# Patient Record
Sex: Female | Born: 1970 | Race: Black or African American | Hispanic: No | Marital: Single | Smoking: Current every day smoker
Health system: Southern US, Community
[De-identification: ages and names within clinical notes are randomized; demographics above are authoritative.]

## PROBLEM LIST (undated history)

## (undated) DIAGNOSIS — F32A Depression, unspecified: Secondary | ICD-10-CM

## (undated) DIAGNOSIS — L930 Discoid lupus erythematosus: Secondary | ICD-10-CM

## (undated) DIAGNOSIS — N390 Urinary tract infection, site not specified: Secondary | ICD-10-CM

## (undated) DIAGNOSIS — D649 Anemia, unspecified: Secondary | ICD-10-CM

## (undated) DIAGNOSIS — K219 Gastro-esophageal reflux disease without esophagitis: Secondary | ICD-10-CM

## (undated) DIAGNOSIS — F17200 Nicotine dependence, unspecified, uncomplicated: Secondary | ICD-10-CM

## (undated) DIAGNOSIS — I82409 Acute embolism and thrombosis of unspecified deep veins of unspecified lower extremity: Secondary | ICD-10-CM

## (undated) DIAGNOSIS — M1990 Unspecified osteoarthritis, unspecified site: Secondary | ICD-10-CM

## (undated) DIAGNOSIS — A6000 Herpesviral infection of urogenital system, unspecified: Secondary | ICD-10-CM

## (undated) DIAGNOSIS — M329 Systemic lupus erythematosus, unspecified: Secondary | ICD-10-CM

## (undated) DIAGNOSIS — F909 Attention-deficit hyperactivity disorder, unspecified type: Secondary | ICD-10-CM

## (undated) DIAGNOSIS — Z720 Tobacco use: Secondary | ICD-10-CM

## (undated) DIAGNOSIS — E119 Type 2 diabetes mellitus without complications: Secondary | ICD-10-CM

## (undated) DIAGNOSIS — F320 Major depressive disorder, single episode, mild: Secondary | ICD-10-CM

## (undated) DIAGNOSIS — R200 Anesthesia of skin: Secondary | ICD-10-CM

## (undated) DIAGNOSIS — M359 Systemic involvement of connective tissue, unspecified: Secondary | ICD-10-CM

## (undated) DIAGNOSIS — I2699 Other pulmonary embolism without acute cor pulmonale: Secondary | ICD-10-CM

## (undated) DIAGNOSIS — IMO0002 Reserved for concepts with insufficient information to code with codable children: Secondary | ICD-10-CM

## (undated) DIAGNOSIS — F419 Anxiety disorder, unspecified: Secondary | ICD-10-CM

## (undated) DIAGNOSIS — F988 Other specified behavioral and emotional disorders with onset usually occurring in childhood and adolescence: Secondary | ICD-10-CM

## (undated) DIAGNOSIS — E785 Hyperlipidemia, unspecified: Secondary | ICD-10-CM

## (undated) HISTORY — DX: Anxiety disorder, unspecified: F41.9

## (undated) HISTORY — DX: Anesthesia of skin: R20.0

## (undated) HISTORY — DX: Major depressive disorder, single episode, mild: F32.0

## (undated) HISTORY — DX: Acute embolism and thrombosis of unspecified deep veins of unspecified lower extremity: I82.409

## (undated) HISTORY — PX: DG TOES*L*: HXRAD270

## (undated) HISTORY — DX: Other specified behavioral and emotional disorders with onset usually occurring in childhood and adolescence: F98.8

## (undated) HISTORY — PX: ABLATION: SHX5711

## (undated) HISTORY — DX: Systemic lupus erythematosus, unspecified: M32.9

## (undated) HISTORY — DX: Type 2 diabetes mellitus without complications: E11.9

## (undated) HISTORY — DX: Herpesviral infection of urogenital system, unspecified: A60.00

## (undated) HISTORY — DX: Depression, unspecified: F32.A

## (undated) HISTORY — DX: Hyperlipidemia, unspecified: E78.5

## (undated) HISTORY — DX: Reserved for concepts with insufficient information to code with codable children: IMO0002

## (undated) HISTORY — DX: Unspecified osteoarthritis, unspecified site: M19.90

## (undated) HISTORY — DX: Other pulmonary embolism without acute cor pulmonale: I26.99

## (undated) HISTORY — DX: Urinary tract infection, site not specified: N39.0

## (undated) HISTORY — PX: DILATION AND CURETTAGE OF UTERUS: SHX78

---

## 2001-11-22 HISTORY — PX: TUBAL LIGATION: SHX77

## 2005-09-06 ENCOUNTER — Emergency Department: Payer: Self-pay | Admitting: Unknown Physician Specialty

## 2007-02-22 ENCOUNTER — Emergency Department: Payer: Self-pay | Admitting: Emergency Medicine

## 2008-02-03 ENCOUNTER — Emergency Department: Payer: Self-pay | Admitting: Emergency Medicine

## 2009-01-17 ENCOUNTER — Emergency Department: Payer: Self-pay | Admitting: Emergency Medicine

## 2009-05-11 ENCOUNTER — Emergency Department: Payer: Self-pay | Admitting: Emergency Medicine

## 2010-07-24 ENCOUNTER — Emergency Department: Payer: Self-pay | Admitting: Emergency Medicine

## 2012-03-10 LAB — HM PAP SMEAR: HM Pap smear: NEGATIVE

## 2012-07-12 ENCOUNTER — Emergency Department: Payer: Self-pay | Admitting: *Deleted

## 2013-05-11 ENCOUNTER — Encounter: Payer: Self-pay | Admitting: Neurology

## 2013-05-11 ENCOUNTER — Ambulatory Visit (INDEPENDENT_AMBULATORY_CARE_PROVIDER_SITE_OTHER): Payer: BC Managed Care – PPO | Admitting: Neurology

## 2013-05-11 VITALS — BP 116/74 | HR 99 | Ht 63.0 in | Wt 220.0 lb

## 2013-05-11 DIAGNOSIS — L930 Discoid lupus erythematosus: Secondary | ICD-10-CM

## 2013-05-11 DIAGNOSIS — R200 Anesthesia of skin: Secondary | ICD-10-CM

## 2013-05-11 DIAGNOSIS — R209 Unspecified disturbances of skin sensation: Secondary | ICD-10-CM

## 2013-05-11 NOTE — Progress Notes (Signed)
[facility]  PATIENT: Melinda Ross DOB: 1971-11-08  HISTORICAL  Melinda Ross is a 42 years old right-handed African American female, referred by her rheumatologist Dr. Dareen Piano for evaluation of right side numbness  In 2011, 3 years ago, she began to notice right upper lip rash, over time it gradually getys bigger, itching, had biopsied by her dermatologist Dr. 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 antibodies, current diagnosis is discoid lupus, not on any immunosuppressive treatment  Over the past 2 years, she was also noticed intermittent numbness involving her right hand, and the right leg, it can happen independently, or sometimes together, sparing her right flank, numbness tingling from right hand extending to her right elbow, similar sensation from right knee down, lasting 20-30 minutes, takeup 10-25% of her day time, in between, she denies significant sensory loss, or weakness, but she had one episode, she stand up from sitting position, without realizing where her right foot are, she suffered right fifth metatarsal fracture because of right ankle eversion in August 2013,   She denied previous history of visual loss, no gait difficulty, no bowel bladder incontinence,  REVIEW OF SYSTEMS: Full 14 system review of systems performed and notable only for rash , easy bruising , skin sensitivity , memory loss , numbness   ALLERGIES: No Known Allergies  HOME MEDICATIONS: No outpatient prescriptions prior to visit.   No facility-administered  medications prior to visit.    PAST MEDICAL HISTORY: Past Medical History  Diagnosis Date  . Numbness     discoid lupus      PAST SURGICAL HISTORY: Past Surgical History  Procedure Laterality Date  . Cesarean section    . Dg toes*l* Bilateral     FAMILY HISTORY: Family History  Problem Relation Age of Onset  . High blood pressure Brother   . Diabetes Brother   . Kidney disease Brother     SOCIAL HISTORY:  History   Social History  . Marital Status: Unknown    Spouse Name: N/A    Number of Children: 3  . Years of Education: N/A   Occupational History  .      [employer]   Social History Main Topics  . Smoking status: Current Some Day Smoker    Types: Cigarettes  . Smokeless tobacco: Never Used     Comment: half pack daily  . Alcohol Use: 0.6 oz/week    1 Shots of liquor per week     Comment: twice a year  . Drug Use: No  . Sexually Active: Not on file   Other Topics Concern  . Not on file   Social History Narrative   Patient is single and lives at home with her daughter. Patient works at [employer]. Patient has three children.   Right handed.    Caffeine -5     PHYSICAL EXAM  Filed Vitals:   05/11/13 0913  BP: 116/74  Pulse: 99  Height: 5\' 3"  (1.6 m)  Weight: 220 lb (99.791 kg)    Not recorded    Body mass index is 38.98 kg/(m^2).   Generalized: In no acute distress  Neck: Supple, no carotid bruits   Cardiac: Regular rate rhythm  Pulmonary: Clear to auscultation bilaterally  Musculoskeletal: No deformity  Neurological examination  Mentation: Alert oriented to time, place, history taking, and causual conversation  Cranial nerve II-XII: Pupils were equal round reactive to light extraocular movements were full, visual field were full on confrontational test. facial sensation and strength were normal. hearing was intact to finger rubbing bilaterally. Uvula tongue midline.  head turning and shoulder shrug and were normal and  symmetric.Tongue protrusion into cheek strength was normal.  Motor: normal tone, bulk and strength. Right wrist Tinel sign  Sensory: Intact to fine touch, pinprick, preserved vibratory sensation, and proprioception at toes.  Coordination: Normal finger to nose, heel-to-shin bilaterally there was no truncal ataxia  Gait: Rising up from seated position without assistance, normal stance, without trunk ataxia, moderate stride, good arm swing, smooth turning, able to perform tiptoe, and heel walking without difficulty.   Romberg signs: Negative  Deep tendon reflexes: Brachioradialis 2/2, biceps 2/2, triceps 2/2, patellar 2/2, Achilles 2/2, plantar responses were flexor bilaterally.   ASSESSMENT AND PLAN  42 years old right-handed [place] American female, with history of possible discoid lupus, now presenting with couple years history of intermittent right sided numbness, involving right hand, arm, and also right leg   1 differentiation diagnosis including right carpal tunnel syndrome, which would not explain her right leg symptoms, need to rule out left hemisphere pathology 2. MRI of brain EMG nerve conduction study  Orders Placed This Encounter  Procedures  . MR Brain Wo Contrast  . NCV with EMG(electromyography)    Levert Feinstein, M.D. Ph.D.  [facility] [address] [place], [place] [address] [phone]

## 2013-07-02 ENCOUNTER — Emergency Department: Payer: Self-pay | Admitting: Emergency Medicine

## 2013-07-02 LAB — URINALYSIS, COMPLETE
Ketone: NEGATIVE
Nitrite: NEGATIVE
Protein: 100
Squamous Epithelial: 55

## 2013-10-15 ENCOUNTER — Encounter: Payer: BC Managed Care – PPO | Admitting: Neurology

## 2013-11-22 DIAGNOSIS — A6000 Herpesviral infection of urogenital system, unspecified: Secondary | ICD-10-CM

## 2013-11-22 HISTORY — DX: Herpesviral infection of urogenital system, unspecified: A60.00

## 2014-05-09 ENCOUNTER — Ambulatory Visit: Payer: Self-pay | Admitting: Obstetrics and Gynecology

## 2016-01-13 ENCOUNTER — Emergency Department
Admission: EM | Admit: 2016-01-13 | Discharge: 2016-01-13 | Disposition: A | Discharge status: Home / Self Care | Payer: BLUE CROSS/BLUE SHIELD | Attending: Emergency Medicine | Admitting: Emergency Medicine

## 2016-01-13 ENCOUNTER — Encounter: Payer: Self-pay | Admitting: Emergency Medicine

## 2016-01-13 ENCOUNTER — Emergency Department: Payer: BLUE CROSS/BLUE SHIELD

## 2016-01-13 DIAGNOSIS — Y998 Other external cause status: Secondary | ICD-10-CM | POA: Diagnosis not present

## 2016-01-13 DIAGNOSIS — F17210 Nicotine dependence, cigarettes, uncomplicated: Secondary | ICD-10-CM | POA: Insufficient documentation

## 2016-01-13 DIAGNOSIS — S96912A Strain of unspecified muscle and tendon at ankle and foot level, left foot, initial encounter: Secondary | ICD-10-CM | POA: Insufficient documentation

## 2016-01-13 DIAGNOSIS — Y9389 Activity, other specified: Secondary | ICD-10-CM | POA: Diagnosis not present

## 2016-01-13 DIAGNOSIS — X58XXXA Exposure to other specified factors, initial encounter: Secondary | ICD-10-CM | POA: Diagnosis not present

## 2016-01-13 DIAGNOSIS — S99922A Unspecified injury of left foot, initial encounter: Secondary | ICD-10-CM | POA: Diagnosis present

## 2016-01-13 DIAGNOSIS — Y9289 Other specified places as the place of occurrence of the external cause: Secondary | ICD-10-CM | POA: Insufficient documentation

## 2016-01-13 MED ORDER — NAPROXEN 500 MG PO TABS: 500.0000 mg | ORAL_TABLET | Freq: Two times a day (BID) | ORAL | Status: DC

## 2016-01-13 MED ORDER — TRAMADOL HCL 50 MG PO TABS: 50.0000 mg | ORAL_TABLET | Freq: Four times a day (QID) | ORAL | Status: DC | PRN

## 2016-01-13 NOTE — ED Notes (Signed)
Pt presents to ED with left foot pain with no known injury. Onset of pain about 2 weeks ago. Has been to the onsite nurse at her place of employment as well as urgent care. Was told it was possibly a stress fx but then was told there was nothing broken. Worse with walking or when she is wearing shoes. Pt states when she takes her shoe off and is resting it doesn't hurt.

## 2016-01-13 NOTE — Discharge Instructions (Signed)
Taking medication as directed.

## 2016-01-13 NOTE — ED Notes (Signed)
States she developed pain to left foot 2 weeks ago. Denies any injury states pain is across top of foot  No swelling noted   Positive pulses and circulation ambulates with limp d/t pain

## 2016-01-13 NOTE — ED Provider Notes (Signed)
[facility] Emergency Department Provider Note  ____________________________________________  Time seen: Approximately 7:54 AM  I have reviewed the triage vital signs and the nursing notes.   HISTORY  Chief Complaint Foot Pain    HPI Melinda Ross is a 45 y.o. female presents with left foot pain for the past 2-3 weeks. The patient describes pain across the dorsal aspect of the foot with radiation around the 5th metatarsal and under the lateral malleolus. Pain with exacerbated by walking and wearing shoes. She denies recent trauma. He is a history of prolonged standing to the 12 hours a day 5-6 days a week.  Past Medical History  Diagnosis Date  . Numbness   . Lupus [facility])     Patient Active Problem List   Diagnosis Date Noted  . 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 Numbness     Past Surgical History  Procedure Laterality Date  . Cesarean section    . Dg toes*l* Bilateral     Current Outpatient Rx  Name  Route  Sig  Dispense  Refill  . ibuprofen (ADVIL,MOTRIN) 200 MG tablet   Oral   Take 200 mg by mouth every 6 (six) hours as needed for pain.         . naproxen (NAPROSYN) 500 MG tablet   Oral   Take 1 tablet (500 mg total) by mouth 2 (two) times daily with a meal.   20 tablet   0   . traMADol (ULTRAM) 50 MG tablet   Oral   Take 1 tablet (50 mg total) by mouth every 6 (six) hours as needed for moderate pain.   12 tablet   0     Allergies Review of patient's allergies indicates no known allergies.  Family History  Problem Relation Age of Onset  . High blood pressure Brother   . Diabetes Brother   . Kidney disease Brother     Social History Social History  Substance Use Topics  . Smoking status: Current Some Day Smoker    Types: Cigarettes  . Smokeless tobacco: Never Used     Comment: half pack daily  . Alcohol Use: No     Comment: twice a year    Review of Systems Constitutional: No fever/chills Eyes: No visual  changes. ENT: No sore throat. Cardiovascular: Denies chest pain. Respiratory: Denies shortness of breath. Gastrointestinal: No abdominal pain.  No nausea, no vomiting.  No diarrhea.  No constipation. Genitourinary: Negative for dysuria. Musculoskeletal: Left dorsal foot pain Skin: Negative for rash. Neurological: Negative for headaches, focal weakness or numbness. Hematological/Lymphatic: Allergic/Immunilogical: Lupus  10-point ROS otherwise negative.  ____________________________________________   PHYSICAL EXAM:  VITAL SIGNS: ED Triage Vitals  Enc Vitals Group     BP 01/13/16 0328 139/99 mmHg     Pulse Rate 01/13/16 0328 91     Resp 01/13/16 0328 18     Temp 01/13/16 0328 98 F (36.7 C)     Temp Source 01/13/16 0328 Oral     SpO2 01/13/16 0328 100 %     Weight 01/13/16 0328 234 lb (106.142 kg)     Height 01/13/16 0328 5\' 3"  (1.6 m)     Head Cir --      Peak Flow --      Pain Score 01/13/16 0328 8     Pain Loc --      Pain Edu? --      Excl. in [place]? --     Constitutional: Alert and oriented. Well  appearing and in no acute distress. Eyes: Conjunctivae are normal. PERRL. EOMI. Head: Atraumatic. Nose: No congestion/rhinnorhea. Mouth/Throat: Mucous membranes are moist.  Oropharynx non-erythematous. Neck: No stridor.  No cervical spine tenderness to palpation. Hematological/Lymphatic/Immunilogical: No cervical lymphadenopathy. Cardiovascular: Normal rate, regular rhythm. Grossly normal heart sounds.  Good peripheral circulation. Respiratory: Normal respiratory effort.  No retractions. Lungs CTAB. Gastrointestinal: Soft and nontender. No distention. No abdominal bruits. No CVA tenderness. Musculoskeletal: No obvious deformity of the left foot. No edema. Patient has some moderate guarding palpation cubital area.  Neurologic:  Normal speech and language. No gross focal neurologic deficits are appreciated. No gait instability. Skin:  Skin is warm, dry and intact. No rash  noted. Psychiatric: Mood and affect are normal. Speech and behavior are normal.  ____________________________________________   LABS (all labs ordered are listed, but only abnormal results are displayed)  Labs Reviewed - No data to display ____________________________________________  EKG   ____________________________________________  RADIOLOGY  No acute findings on left foot x-ray. ____________________________________________   PROCEDURES  Procedure(s) performed: None  Critical Care performed: No  ____________________________________________   INITIAL IMPRESSION / ASSESSMENT AND PLAN / ED COURSE  Pertinent labs & imaging results that were available during my care of the patient were reviewed by me and considered in my medical decision making (see chart for details).  Left foot strain. She given discharge care instructions. Patient given a prescription for naproxen. Patient advised to follow-up with podiatry if condition persists. ____________________________________________   FINAL CLINICAL IMPRESSION(S) / ED DIAGNOSES  Final diagnoses:  Strain of foot, left, initial encounter      Sable Feil, PA-C 01/13/16 0809  Wandra Arthurs, MD 01/13/16 1406

## 2016-05-05 DIAGNOSIS — L930 Discoid lupus erythematosus: Secondary | ICD-10-CM | POA: Diagnosis not present

## 2016-05-05 DIAGNOSIS — L509 Urticaria, unspecified: Secondary | ICD-10-CM | POA: Diagnosis not present

## 2016-05-05 DIAGNOSIS — R21 Rash and other nonspecific skin eruption: Secondary | ICD-10-CM | POA: Diagnosis not present

## 2016-05-19 DIAGNOSIS — L930 Discoid lupus erythematosus: Secondary | ICD-10-CM | POA: Diagnosis not present

## 2016-07-09 DIAGNOSIS — R0781 Pleurodynia: Secondary | ICD-10-CM | POA: Diagnosis not present

## 2016-07-09 DIAGNOSIS — M25511 Pain in right shoulder: Secondary | ICD-10-CM | POA: Diagnosis not present

## 2016-10-21 DIAGNOSIS — L506 Contact urticaria: Secondary | ICD-10-CM | POA: Diagnosis not present

## 2016-10-21 DIAGNOSIS — L930 Discoid lupus erythematosus: Secondary | ICD-10-CM | POA: Diagnosis not present

## 2016-10-21 DIAGNOSIS — M79604 Pain in right leg: Secondary | ICD-10-CM | POA: Diagnosis not present

## 2016-12-15 ENCOUNTER — Emergency Department
Admission: EM | Admit: 2016-12-15 | Discharge: 2016-12-15 | Disposition: A | Discharge status: Home / Self Care | Payer: BLUE CROSS/BLUE SHIELD | Attending: Emergency Medicine | Admitting: Emergency Medicine

## 2016-12-15 ENCOUNTER — Emergency Department: Payer: BLUE CROSS/BLUE SHIELD

## 2016-12-15 DIAGNOSIS — N939 Abnormal uterine and vaginal bleeding, unspecified: Secondary | ICD-10-CM | POA: Diagnosis not present

## 2016-12-15 DIAGNOSIS — F17210 Nicotine dependence, cigarettes, uncomplicated: Secondary | ICD-10-CM | POA: Diagnosis not present

## 2016-12-15 LAB — CBC
HCT: 34.2 % — ABNORMAL LOW (ref 35.0–47.0)
HCT: 34.3 % — ABNORMAL LOW (ref 35.0–47.0)
HEMOGLOBIN: 12 g/dL (ref 12.0–16.0)
HEMOGLOBIN: 11.8 g/dL — AB (ref 12.0–16.0)
MCH: 32.8 pg (ref 26.0–34.0)
MCH: 32.4 pg (ref 26.0–34.0)
MCHC: 35 g/dL (ref 32.0–36.0)
MCHC: 34.5 g/dL (ref 32.0–36.0)
MCV: 93.7 fL (ref 80.0–100.0)
MCV: 94 fL (ref 80.0–100.0)
PLATELETS: 217 10*3/uL (ref 150–440)
Platelets: 204 10*3/uL (ref 150–440)
RBC: 3.65 MIL/uL — AB (ref 3.80–5.20)
RBC: 3.65 MIL/uL — AB (ref 3.80–5.20)
RDW: 15.8 % — ABNORMAL HIGH (ref 11.5–14.5)
RDW: 15.5 % — ABNORMAL HIGH (ref 11.5–14.5)
WBC: 5 10*3/uL (ref 3.6–11.0)
WBC: 4.5 10*3/uL (ref 3.6–11.0)

## 2016-12-15 LAB — POCT PREGNANCY, URINE: PREG TEST UR: NEGATIVE

## 2016-12-15 MED ORDER — MEDROXYPROGESTERONE ACETATE 10 MG PO TABS
20.0000 mg | ORAL_TABLET | Freq: Every day | ORAL | Status: DC
  Administered 2016-12-15: 20 mg via ORAL
  Filled 2016-12-15: qty 2

## 2016-12-15 MED ORDER — MEDROXYPROGESTERONE ACETATE 10 MG PO TABS: 20.0000 mg | ORAL_TABLET | Freq: Three times a day (TID) | ORAL | 0 refills | Status: DC

## 2016-12-15 NOTE — Discharge Instructions (Signed)
Please seek medical attention for any high fevers, chest pain, shortness of breath, change in behavior, persistent vomiting, bloody stool or any other new or concerning symptoms.  

## 2016-12-15 NOTE — ED Notes (Signed)
Patient transported to Ultrasound 

## 2016-12-15 NOTE — ED Notes (Signed)
Informed pt waiting for [place].

## 2016-12-15 NOTE — ED Triage Notes (Signed)
Heavy vaginal bleeding X 2 days. Pt having to change pad every 30 min. Reports large clots. Pt alert and oriented X4, active, cooperative, pt in NAD. RR even and unlabored, color WNL.

## 2016-12-15 NOTE — ED Provider Notes (Signed)
[facility] Emergency Department Provider Note  ____________________________________________   I have reviewed the triage vital signs and the nursing notes.   HISTORY  Chief Complaint Vaginal Bleeding   History limited by: Not Limited   HPI Melinda Ross is a 46 y.o. female who presents to the emergency department today because of vaginal bleeding. The patient states that for the past two days she has had significant vaginal bleeding. She has been passing clots. She has gone through 3 packages of pads at this point, having to change them in less than an hour. The patient called [facility] who recommended she present to the ED. She has not noticed any increased weakness or shortness of breath.   Past Medical History:  Diagnosis Date  . Lupus   . Numbness     Patient Active Problem List   Diagnosis Date Noted  . 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 Numbness     Past Surgical History:  Procedure Laterality Date  . CESAREAN SECTION    . DG TOES*L* Bilateral     Prior to Admission medications   Medication Sig Start Date End Date Taking? Authorizing Provider  ibuprofen (ADVIL,MOTRIN) 200 MG tablet Take 200 mg by mouth every 6 (six) hours as needed for pain.    Historical Provider, MD  naproxen (NAPROSYN) 500 MG tablet Take 1 tablet (500 mg total) by mouth 2 (two) times daily with a meal. 01/13/16   Sable Feil, PA-C  traMADol (ULTRAM) 50 MG tablet Take 1 tablet (50 mg total) by mouth every 6 (six) hours as needed for moderate pain. 01/13/16   Sable Feil, PA-C    Allergies Patient has no known allergies.  Family History  Problem Relation Age of Onset  . High blood pressure Brother   . Diabetes Brother   . Kidney disease Brother     Social History Social History  Substance Use Topics  . Smoking status: Current Some Day Smoker    Types: Cigarettes  . Smokeless tobacco: Never Used     Comment: half pack daily  . Alcohol use No   Comment: twice a year    Review of Systems  Constitutional: Negative for fever. Cardiovascular: Negative for chest pain. Respiratory: Negative for shortness of breath. Gastrointestinal: Negative for abdominal pain, vomiting and diarrhea. Neurological: Negative for headaches, focal weakness or numbness.  10-point ROS otherwise negative.  ____________________________________________   PHYSICAL EXAM:  VITAL SIGNS: ED Triage Vitals  Enc Vitals Group     BP 12/15/16 1506 (!) 166/87     Pulse Rate 12/15/16 1506 90     Resp 12/15/16 1506 18     Temp 12/15/16 1506 98 F (36.7 C)     Temp Source 12/15/16 1506 Oral     SpO2 12/15/16 1506 100 %     Weight 12/15/16 1507 222 lb (100.7 kg)     Height 12/15/16 1507 5\' 3"  (1.6 m)     Head Circumference --      Peak Flow --      Pain Score 12/15/16 1527 2   Constitutional: Alert and oriented. Well appearing and in no distress. Eyes: Conjunctivae are normal. Normal extraocular movements. ENT   Head: Normocephalic and atraumatic.   Nose: No congestion/rhinnorhea.   Mouth/Throat: Mucous membranes are moist.   Neck: No stridor. Hematological/Lymphatic/Immunilogical: No cervical lymphadenopathy. Cardiovascular: Normal rate, regular rhythm.  No murmurs, rubs, or gallops.  Respiratory: Normal respiratory effort without tachypnea nor retractions. Breath sounds are clear  and equal bilaterally. No wheezes/rales/rhonchi. Gastrointestinal: Soft and non tender. No rebound. No guarding.  Genitourinary: Deferred Musculoskeletal: Normal range of motion in all extremities. No lower extremity edema. Neurologic:  Normal speech and language. No gross focal neurologic deficits are appreciated.  Skin:  Skin is warm, dry and intact. No rash noted. Psychiatric: Mood and affect are normal. Speech and behavior are normal. Patient exhibits appropriate insight and judgment.  ____________________________________________    LABS (pertinent  positives/negatives)  Labs Reviewed  CBC - Abnormal; Notable for the following:       Result Value   RBC 3.65 (*)    HCT 34.2 (*)    RDW 15.8 (*)    All other components within normal limits  CBC - Abnormal; Notable for the following:    RBC 3.65 (*)    Hemoglobin 11.8 (*)    HCT 34.3 (*)    RDW 15.5 (*)    All other components within normal limits  POCT PREGNANCY, URINE     ____________________________________________   EKG  None  ____________________________________________    RADIOLOGY  US IMPRESSION:  1. No sonographic findings to explain the patient's vaginal  bleeding.  2. 4.4 x 4 x 3.6 cm hypoechoic left ovarian mass without internal  Doppler flow and a thickened internal septation. This likely  reflects a complicated cyst. Follow-up pelvic ultrasound in 3 months  is recommended.     ____________________________________________   PROCEDURES  Procedures  ____________________________________________   INITIAL IMPRESSION / ASSESSMENT AND PLAN / ED COURSE  Pertinent labs & imaging results that were available during my care of the patient were reviewed by me and considered in my medical decision making (see chart for details).  Presented to the emergency department today with vaginal bleeding. Ultrasound was performed which did not show any uterine abnormalities however there was a fair ovary. Repeat blood work without any significant drop. Discussed with [place] [facility] Dr. Georgianne Fick. Recommened Provera. Will contact office to get patient appointment in the next 1-2 days.  ____________________________________________   FINAL CLINICAL IMPRESSION(S) / ED DIAGNOSES  Final diagnoses:  Vaginal bleeding  Abnormal uterine bleeding     Note: This dictation was prepared with Dragon dictation. Any transcriptional errors that result from this process are unintentional     Nance Pear, MD 12/15/16 2042

## 2016-12-15 NOTE — ED Notes (Signed)
Pt has had ultrasound. Waiting on results. No needs.

## 2016-12-17 DIAGNOSIS — L930 Discoid lupus erythematosus: Secondary | ICD-10-CM | POA: Diagnosis not present

## 2016-12-17 DIAGNOSIS — N920 Excessive and frequent menstruation with regular cycle: Secondary | ICD-10-CM | POA: Diagnosis not present

## 2016-12-22 ENCOUNTER — Encounter
Admission: RE | Admit: 2016-12-22 | Discharge: 2016-12-22 | Disposition: A | Discharge status: Home / Self Care | Payer: BLUE CROSS/BLUE SHIELD | Source: Ambulatory Visit | Attending: Obstetrics and Gynecology | Admitting: Obstetrics and Gynecology

## 2016-12-22 NOTE — Patient Instructions (Signed)
  Your procedure is scheduled on: 12-24-16 Report to Same Day Surgery 2nd floor medical mall [facility] Entrance-take elevator on left to 2nd floor.  Check in with surgery information desk.) To find out your arrival time please call [phone] between 1PM - 3PM on 12-23-16  Remember: Instructions that are not followed completely may result in serious medical risk, up to and including death, or upon the discretion of your surgeon and anesthesiologist your surgery may need to be rescheduled.    _x___ 1. Do not eat food or drink liquids after midnight. No gum chewing or hard candies.     __x__ 2. No Alcohol for 24 hours before or after surgery.   __x__3. No Smoking for 24 prior to surgery.   ____  4. Bring all medications with you on the day of surgery if instructed.    __x__ 5. Notify your doctor if there is any change in your medical condition     (cold, fever, infections).     Do not wear jewelry, make-up, hairpins, clips or nail polish.  Do not wear lotions, powders, or perfumes. You may wear deodorant.  Do not shave 48 hours prior to surgery. Men may shave face and neck.  Do not bring valuables to the hospital.    [facility] is not responsible for any belongings or valuables.               Contacts, dentures or bridgework may not be worn into surgery.  Leave your suitcase in the car. After surgery it may be brought to your room.  For patients admitted to the hospital, discharge time is determined by your treatment team.   Patients discharged the day of surgery will not be allowed to drive home.  You will need someone to drive you home and stay with you the night of your procedure.    Please read over the following fact sheets that you were given:   [facility] Preparing for Surgery and or MRSA Information   ____ Take these medicines the morning of surgery with A SIP OF WATER:    1. NONE  2.  3.  4.  5.  6.  ____Fleets enema or Magnesium Citrate as directed.   ____ Use  CHG Soap or sage wipes as directed on instruction sheet   ____ Use inhalers on the day of surgery and bring to hospital day of surgery  ____ Stop metformin 2 days prior to surgery    ____ Take 1/2 of usual insulin dose the night before surgery and none on the morning of           surgery.   ____ Stop Aspirin, Coumadin, Pllavix ,Eliquis, Effient, or Pradaxa  x__ Stop Anti-inflammatories such as Advil, Aleve, Ibuprofen, Motrin, Naproxen,          Naprosyn, Goodies powders or aspirin products NOW- Ok to take Tylenol OR TRAMADOL   ____ Stop supplements until after surgery.    ____ Bring C-Pap to the hospital.

## 2016-12-24 ENCOUNTER — Ambulatory Visit: Payer: BLUE CROSS/BLUE SHIELD | Admitting: Anesthesiology

## 2016-12-24 ENCOUNTER — Ambulatory Visit
Admission: RE | Admit: 2016-12-24 | Discharge: 2016-12-24 | Disposition: A | Discharge status: Home / Self Care | Payer: BLUE CROSS/BLUE SHIELD | Source: Ambulatory Visit | Attending: Obstetrics and Gynecology | Admitting: Obstetrics and Gynecology

## 2016-12-24 ENCOUNTER — Encounter: Payer: Self-pay | Admitting: *Deleted

## 2016-12-24 ENCOUNTER — Encounter
Admission: RE | Disposition: A | Discharge status: Home / Self Care | Payer: Self-pay | Source: Ambulatory Visit | Attending: Obstetrics and Gynecology

## 2016-12-24 DIAGNOSIS — M93979 Osteochondropathy, unspecified, unspecified ankle and foot: Secondary | ICD-10-CM | POA: Diagnosis not present

## 2016-12-24 DIAGNOSIS — N920 Excessive and frequent menstruation with regular cycle: Secondary | ICD-10-CM | POA: Insufficient documentation

## 2016-12-24 DIAGNOSIS — E669 Obesity, unspecified: Secondary | ICD-10-CM | POA: Diagnosis not present

## 2016-12-24 DIAGNOSIS — F419 Anxiety disorder, unspecified: Secondary | ICD-10-CM | POA: Insufficient documentation

## 2016-12-24 DIAGNOSIS — F17210 Nicotine dependence, cigarettes, uncomplicated: Secondary | ICD-10-CM | POA: Insufficient documentation

## 2016-12-24 HISTORY — PX: DILATATION & CURETTAGE/HYSTEROSCOPY WITH MYOSURE: SHX6511

## 2016-12-24 LAB — POCT PREGNANCY, URINE: PREG TEST UR: NEGATIVE

## 2016-12-24 SURGERY — DILATATION & CURETTAGE/HYSTEROSCOPY WITH MYOSURE
Anesthesia: General

## 2016-12-24 MED ORDER — DEXAMETHASONE SODIUM PHOSPHATE 10 MG/ML IJ SOLN
INTRAMUSCULAR | Status: AC
  Filled 2016-12-24: qty 1

## 2016-12-24 MED ORDER — MIDAZOLAM HCL 2 MG/2ML IJ SOLN
INTRAMUSCULAR | Status: AC
Start: 2016-12-24 — End: ?
  Filled 2016-12-24: qty 2

## 2016-12-24 MED ORDER — FENTANYL CITRATE (PF) 100 MCG/2ML IJ SOLN
25.0000 ug | INTRAMUSCULAR | Status: DC | PRN
  Administered 2016-12-24 (×4): 25 ug via INTRAVENOUS

## 2016-12-24 MED ORDER — GLYCOPYRROLATE 0.2 MG/ML IJ SOLN
INTRAMUSCULAR | Status: DC | PRN
  Administered 2016-12-24: 0.2 mg via INTRAVENOUS

## 2016-12-24 MED ORDER — FAMOTIDINE 20 MG PO TABS
ORAL_TABLET | ORAL | Status: AC
  Filled 2016-12-24: qty 1

## 2016-12-24 MED ORDER — LACTATED RINGERS IV SOLN
INTRAVENOUS | Status: DC
  Administered 2016-12-24: 13:00:00 via INTRAVENOUS

## 2016-12-24 MED ORDER — FENTANYL CITRATE (PF) 100 MCG/2ML IJ SOLN
INTRAMUSCULAR | Status: AC
  Filled 2016-12-24: qty 2

## 2016-12-24 MED ORDER — HYDROCODONE-ACETAMINOPHEN 5-325 MG PO TABS: 1.0000 | ORAL_TABLET | Freq: Four times a day (QID) | ORAL | 0 refills | Status: DC | PRN

## 2016-12-24 MED ORDER — ONDANSETRON HCL 4 MG/2ML IJ SOLN: 4.0000 mg | Freq: Once | INTRAMUSCULAR | Status: DC | PRN

## 2016-12-24 MED ORDER — ACETAMINOPHEN 10 MG/ML IV SOLN
INTRAVENOUS | Status: DC | PRN
  Administered 2016-12-24: 1000 mg via INTRAVENOUS

## 2016-12-24 MED ORDER — PROPOFOL 10 MG/ML IV BOLUS
INTRAVENOUS | Status: DC | PRN
Start: 2016-12-24 — End: 2016-12-24
  Administered 2016-12-24: 150 mg via INTRAVENOUS

## 2016-12-24 MED ORDER — PROPOFOL 10 MG/ML IV BOLUS
INTRAVENOUS | Status: AC
  Filled 2016-12-24: qty 20

## 2016-12-24 MED ORDER — GLYCOPYRROLATE 0.2 MG/ML IJ SOLN
INTRAMUSCULAR | Status: AC
  Filled 2016-12-24: qty 1

## 2016-12-24 MED ORDER — DEXAMETHASONE SODIUM PHOSPHATE 10 MG/ML IJ SOLN
INTRAMUSCULAR | Status: DC | PRN
  Administered 2016-12-24: 10 mg via INTRAVENOUS

## 2016-12-24 MED ORDER — KETOROLAC TROMETHAMINE 30 MG/ML IJ SOLN
INTRAMUSCULAR | Status: AC
  Filled 2016-12-24: qty 1

## 2016-12-24 MED ORDER — ONDANSETRON HCL 4 MG/2ML IJ SOLN
INTRAMUSCULAR | Status: AC
  Filled 2016-12-24: qty 2

## 2016-12-24 MED ORDER — MIDAZOLAM HCL 2 MG/2ML IJ SOLN
INTRAMUSCULAR | Status: DC | PRN
  Administered 2016-12-24: 2 mg via INTRAVENOUS

## 2016-12-24 MED ORDER — FAMOTIDINE 20 MG PO TABS
20.0000 mg | ORAL_TABLET | Freq: Once | ORAL | Status: AC
  Administered 2016-12-24: 20 mg via ORAL

## 2016-12-24 MED ORDER — LIDOCAINE HCL (PF) 2 % IJ SOLN
INTRAMUSCULAR | Status: AC
  Filled 2016-12-24: qty 2

## 2016-12-24 MED ORDER — ONDANSETRON HCL 4 MG/2ML IJ SOLN
INTRAMUSCULAR | Status: DC | PRN
  Administered 2016-12-24: 4 mg via INTRAVENOUS

## 2016-12-24 MED ORDER — KETOROLAC TROMETHAMINE 30 MG/ML IJ SOLN
INTRAMUSCULAR | Status: DC | PRN
  Administered 2016-12-24: 30 mg via INTRAVENOUS

## 2016-12-24 MED ORDER — FENTANYL CITRATE (PF) 100 MCG/2ML IJ SOLN
INTRAMUSCULAR | Status: DC | PRN
  Administered 2016-12-24 (×4): 25 ug via INTRAVENOUS

## 2016-12-24 MED ORDER — LIDOCAINE HCL (CARDIAC) 20 MG/ML IV SOLN
INTRAVENOUS | Status: DC | PRN
  Administered 2016-12-24: 50 mg via INTRAVENOUS

## 2016-12-24 MED ORDER — ACETAMINOPHEN 10 MG/ML IV SOLN
INTRAVENOUS | Status: AC
  Filled 2016-12-24: qty 100

## 2016-12-24 SURGICAL SUPPLY — 19 items
ABLATOR ENDOMETRIAL MYOSURE (ABLATOR) ×2 IMPLANT
CANISTER SUC SOCK COL 7IN (MISCELLANEOUS) ×2 IMPLANT
CATH ROBINSON RED A/P 16FR (CATHETERS) ×2 IMPLANT
DEVICE MYOSURE LITE (MISCELLANEOUS) IMPLANT
ELECT REM PT RETURN 9FT ADLT (ELECTROSURGICAL) ×2
ELECTRODE REM PT RTRN 9FT ADLT (ELECTROSURGICAL) ×1 IMPLANT
GLOVE BIO SURGEON STRL SZ7 (GLOVE) ×2 IMPLANT
GOWN STRL REUS W/ TWL LRG LVL3 (GOWN DISPOSABLE) ×2 IMPLANT
GOWN STRL REUS W/TWL LRG LVL3 (GOWN DISPOSABLE) ×2
KIT RM TURNOVER CYSTO AR (KITS) ×2 IMPLANT
PACK DNC HYST (MISCELLANEOUS) ×2 IMPLANT
PAD OB MATERNITY 4.3X12.25 (PERSONAL CARE ITEMS) ×2 IMPLANT
PAD PREP 24X41 OB/GYN DISP (PERSONAL CARE ITEMS) ×2 IMPLANT
SEAL ROD LENS SCOPE MYOSURE (ABLATOR) ×2 IMPLANT
SOL .9 NS 3000ML IRR  AL (IV SOLUTION) ×2
SOL .9 NS 3000ML IRR UROMATIC (IV SOLUTION) ×2 IMPLANT
TOWEL OR 17X26 4PK STRL BLUE (TOWEL DISPOSABLE) ×2 IMPLANT
TUBING CONNECTING 10 (TUBING) ×2 IMPLANT
TUBING HYSTEROSCOPY DOLPHIN (MISCELLANEOUS) ×2 IMPLANT

## 2016-12-24 NOTE — Anesthesia Postprocedure Evaluation (Signed)
Anesthesia Post Note  Patient: Melinda Ross  Procedure(s) Performed: Procedure(s) (LRB): DILATATION & CURETTAGE/HYSTEROSCOPY WITH MYOSURE (N/A)  Patient location during evaluation: PACU Anesthesia Type: General Level of consciousness: awake and alert Pain management: pain level controlled Vital Signs Assessment: post-procedure vital signs reviewed and stable Respiratory status: spontaneous breathing and respiratory function stable Cardiovascular status: stable Anesthetic complications: no     Last Vitals:  Vitals:   12/24/16 1432 12/24/16 1439  BP: 128/80   Pulse: 88 77  Resp: 16 12  Temp: 36.1 C     Last Pain:  Vitals:   12/24/16 1439  TempSrc:   PainSc: 8                  Jamill Wetmore K

## 2016-12-24 NOTE — Anesthesia Post-op Follow-up Note (Cosign Needed)
Anesthesia QCDR form completed.        

## 2016-12-24 NOTE — Discharge Instructions (Signed)

## 2016-12-24 NOTE — Anesthesia Procedure Notes (Signed)
Procedure Name: LMA Insertion Date/Time: 12/24/2016 1:50 PM Performed by: Johnna Acosta Pre-anesthesia Checklist: Patient identified, Emergency Drugs available, Suction available, Timeout performed and Patient being monitored Patient Re-evaluated:Patient Re-evaluated prior to inductionOxygen Delivery Method: Circle system utilized Preoxygenation: Pre-oxygenation with 100% oxygen Intubation Type: IV induction LMA: LMA inserted LMA Size: 4.0 Tube type: Oral Number of attempts: 1 Placement Confirmation: positive ETCO2 and breath sounds checked- equal and bilateral Tube secured with: Tape Dental Injury: Teeth and Oropharynx as per pre-operative assessment

## 2016-12-24 NOTE — Transfer of Care (Signed)
Immediate Anesthesia Transfer of Care Note  Patient: Melinda Ross  Procedure(s) Performed: Procedure(s): DILATATION & CURETTAGE/HYSTEROSCOPY WITH MYOSURE (N/A)  Patient Location: PACU  Anesthesia Type:General  Level of Consciousness: awake and alert   Airway & Oxygen Therapy: Patient Spontanous Breathing and Patient connected to face mask oxygen  Post-op Assessment: Report given to RN and Post -op Vital signs reviewed and stable  Post vital signs: Reviewed and stable  Last Vitals:  Vitals:   12/24/16 1254  BP: (!) 157/87  Pulse: 91  Resp: 16  Temp: 36.8 C    Last Pain:  Vitals:   12/24/16 1254  TempSrc: Oral         Complications: No apparent anesthesia complications

## 2016-12-24 NOTE — H&P (Signed)
Date of Initial paper H&P: 12/17/2016  History reviewed, patient examined, no change in status, stable for surgery.  Menorrhagia, inability to tolerate in office endometrial biopsy.

## 2016-12-24 NOTE — Op Note (Signed)
Patient Name: Melinda Ross Date of Procedure: 12/24/16   Preoperative Diagnosis: 1) 46 y.o. with menorrhagia 2) Inability to tolerate in office endometrial biopsy  Postoperative Diagnosis: 1) 46 y.o. with menorrhagia  Operation Performed: Hysteroscopy, dilation and curettage  Indication: Menorrhagia over age 57, obesity   Anesthesia: .Choice  Primary Surgeon: Malachy Mood, MD  Assistant: none  Preoperative Antibiotics: none  Estimated Blood Loss: minimal  IV Fluids: 585mL  Urine Output:: ~19mL straight cath  Drains or Tubes: none  Implants: none  Specimens Removed: endometrial curettings  Complications: none  Intraoperative Findings:  Normal cervix, normal uterine cavity, thickened endometrium primarily involving posterior wall of the uterus.  Patient Condition: stable  Procedure in Detail:  Patient was taken to the operating room were she was administered general endotracheal anesthesia.  She was positioned in the dorsal lithotomy position utilizing Allen stirups, prepped and draped in the usual sterile fashion.  Uterus was noted to be anteverted in size, non-enlarged.   Prior to proceeding with the case a time out was performed.  Attention was turned to the patient's pelvis.  A red rubber catheter was used to empty the patient's bladder.  An operative speculum was placed to allow visualization of the cervix.  The anterior lip of the cervix was grasped with a single tooth tenaculum and the cervix was sequentially dilated using pratt dilators.  The hysteroscope was then advanced into the uterine cavity noting the above findings.  Sharp curettage was performed and the resulting specimen collected and sent to pathology.    The single tooth tenaculum was removed from the cervix.  The tenaculum sites and cervix were noted to be  Hemostatic before removing the operative speculum.  Sponge needle and instrument counts were corrects times two.  The patient tolerated the  procedure well and was taken to the recovery room in stable condition.

## 2016-12-24 NOTE — Anesthesia Preprocedure Evaluation (Signed)
Anesthesia Evaluation  Patient identified by MRN, date of birth, ID band Patient awake    Reviewed: Allergy & Precautions, NPO status , Patient's Chart, lab work & pertinent test results  History of Anesthesia Complications Negative for: history of anesthetic complications  Airway Mallampati: II       Dental   Pulmonary neg pulmonary ROS, Current Smoker,           Cardiovascular negative cardio ROS       Neuro/Psych negative neurological ROS     GI/Hepatic negative GI ROS, Neg liver ROS,   Endo/Other  negative endocrine ROS  Renal/GU negative Renal ROS     Musculoskeletal   Abdominal   Peds  Hematology negative hematology ROS (+)   Anesthesia Other Findings   Reproductive/Obstetrics                             Anesthesia Physical Anesthesia Plan  ASA: II  Anesthesia Plan: General   Post-op Pain Management:    Induction: Intravenous  Airway Management Planned: LMA  Additional Equipment:   Intra-op Plan:   Post-operative Plan:   Informed Consent: I have reviewed the patients History and Physical, chart, labs and discussed the procedure including the risks, benefits and alternatives for the proposed anesthesia with the patient or authorized representative who has indicated his/her understanding and acceptance.     Plan Discussed with:   Anesthesia Plan Comments:         Anesthesia Quick Evaluation

## 2016-12-27 ENCOUNTER — Encounter: Payer: Self-pay | Admitting: Obstetrics and Gynecology

## 2016-12-28 LAB — SURGICAL PATHOLOGY

## 2017-02-08 ENCOUNTER — Ambulatory Visit: Payer: BLUE CROSS/BLUE SHIELD | Admitting: Obstetrics and Gynecology

## 2017-02-09 ENCOUNTER — Telehealth: Payer: Self-pay | Admitting: Obstetrics and Gynecology

## 2017-02-09 NOTE — Telephone Encounter (Signed)
-----   Message from Malachy Mood, MD sent at 02/08/2017  1:56 PM EDT ----- Surgery Booking Request Patient Full Name: Melinda Ross, Scherger MRN: [id number]  DOB: 1971-03-10  Surgeon: Malachy Mood, MD  Requested Surgery Date and Time: 2-4 weeks Primary Diagnosis and Code: Menorrhagia Secondary Diagnosis and Code:  Surgical Procedure: Novasure endometrial ablation L&D Notification: N/A Admission Status: same day surgery Length of Surgery: 1hr Special Case Needs: none H&P: week prior (date) Phone Interview or Office Pre-Admit: preadmit Interpreter: N/A Language: english Medical Clearance: none Special Scheduling Instructions: none

## 2017-02-09 NOTE — Telephone Encounter (Signed)
Patient is aware of H&P on 03/18/17 @ 8:50am with Pre-Admit Testing afterwards, and OR on 03/25/17. Patient was given my ext.

## 2017-03-17 ENCOUNTER — Encounter: Payer: Self-pay | Admitting: Obstetrics and Gynecology

## 2017-03-18 ENCOUNTER — Encounter: Payer: Self-pay | Admitting: Obstetrics and Gynecology

## 2017-03-18 ENCOUNTER — Encounter
Admission: RE | Admit: 2017-03-18 | Discharge: 2017-03-18 | Disposition: A | Discharge status: Home / Self Care | Payer: BLUE CROSS/BLUE SHIELD | Source: Ambulatory Visit | Attending: Obstetrics and Gynecology | Admitting: Obstetrics and Gynecology

## 2017-03-18 ENCOUNTER — Ambulatory Visit (INDEPENDENT_AMBULATORY_CARE_PROVIDER_SITE_OTHER): Payer: BLUE CROSS/BLUE SHIELD | Admitting: Obstetrics and Gynecology

## 2017-03-18 VITALS — BP 112/68 | HR 93 | Ht 63.0 in | Wt 225.0 lb

## 2017-03-18 DIAGNOSIS — Z01818 Encounter for other preprocedural examination: Secondary | ICD-10-CM | POA: Diagnosis not present

## 2017-03-18 DIAGNOSIS — N920 Excessive and frequent menstruation with regular cycle: Secondary | ICD-10-CM

## 2017-03-18 HISTORY — DX: Anemia, unspecified: D64.9

## 2017-03-18 LAB — DIFFERENTIAL
Basophils Absolute: 0 10*3/uL (ref 0–0.1)
Basophils Relative: 0 %
EOS PCT: 1 %
Eosinophils Absolute: 0 10*3/uL (ref 0–0.7)
LYMPHS ABS: 1.3 10*3/uL (ref 1.0–3.6)
LYMPHS PCT: 29 %
MONO ABS: 0.5 10*3/uL (ref 0.2–0.9)
Monocytes Relative: 10 %
NEUTROS ABS: 2.7 10*3/uL (ref 1.4–6.5)
Neutrophils Relative %: 60 %

## 2017-03-18 LAB — BASIC METABOLIC PANEL
Anion gap: 6 (ref 5–15)
BUN: 11 mg/dL (ref 6–20)
CHLORIDE: 106 mmol/L (ref 101–111)
CO2: 27 mmol/L (ref 22–32)
CREATININE: 0.83 mg/dL (ref 0.44–1.00)
Calcium: 8.9 mg/dL (ref 8.9–10.3)
GFR calc Af Amer: >60 mL/min (ref >60)
GFR calc non Af Amer: >60 mL/min (ref >60)
Glucose, Bld: 96 mg/dL (ref 65–99)
POTASSIUM: 3.7 mmol/L (ref 3.5–5.1)
SODIUM: 139 mmol/L (ref 135–145)

## 2017-03-18 LAB — CBC
HEMATOCRIT: 34.8 % — AB (ref 35.0–47.0)
Hemoglobin: 11.5 g/dL — ABNORMAL LOW (ref 12.0–16.0)
MCH: 29.7 pg (ref 26.0–34.0)
MCHC: 33.2 g/dL (ref 32.0–36.0)
MCV: 89.5 fL (ref 80.0–100.0)
PLATELETS: 269 10*3/uL (ref 150–440)
RBC: 3.88 MIL/uL (ref 3.80–5.20)
RDW: 15.6 % — ABNORMAL HIGH (ref 11.5–14.5)
WBC: 4.4 10*3/uL (ref 3.6–11.0)

## 2017-03-18 NOTE — Progress Notes (Signed)
Obstetrics & Gynecology Surgery H&P    Chief Complaint: Scheduled Surgery   History of Present Illness: Patient is a 46 y.o. [id number] presenting for scheduled hysteroscopy, D&C, novasure ablation, for the treatment or further evaluation of menorrhagia.   Prior Treatments prior to proceeding with surgery include: [place] and hysteroscopy D&C  Preoperative Pap: NIL Preoperative Endometrial biopsy: normal at time of D&C Preoperative Ultrasound: no evidence of fibroids   Review of Systems:10 point review of systems  Past Medical History:  Past Medical History:  Diagnosis Date  . Anxiety   . Attention deficit disorder (ADD)   . Genital herpes 2015  . 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 Mild depression ([place])   . Numbness     Past Surgical History:  Past Surgical History:  Procedure Laterality Date  . CESAREAN SECTION  2003   [facility]  . DG TOES*L* Bilateral   . DILATATION & CURETTAGE/HYSTEROSCOPY WITH MYOSURE N/A 12/24/2016   Procedure: DILATATION & CURETTAGE/HYSTEROSCOPY WITH MYOSURE;  Surgeon: Malachy Mood, MD;  Location: [facility] ORS;  Service: Gynecology;  Laterality: N/A;  . TUBAL LIGATION  2003   [facility]    Family History:  Family History  Problem Relation Age of Onset  . High blood pressure Brother   . Diabetes Brother   . Kidney disease Brother   . Lung cancer Mother     Social History:  Social History   Social History  . Marital status: Single    Spouse name: N/A  . Number of children: 3  . Years of education: N/A   Occupational History  .      [employer]   Social History Main Topics  . Smoking status: Current Some Day Smoker    Packs/day: 1.00    Years: 23.00    Types: Cigarettes  . Smokeless tobacco: Never Used  . Alcohol use Yes     Comment: social  . Drug use: No  . Sexual activity: Yes   Other Topics Concern  . Not on file   Social History Narrative   Patient is single and lives at home with her  daughter. Patient works at [employer]. Patient has three children.   Right handed.    Caffeine -5    Allergies:  No Known Allergies  Medications: Prior to Admission medications   Medication Sig Start Date End Date Taking? Authorizing Provider  acetaminophen (ACETAMINOPHEN 8 HOUR) 650 MG CR tablet Take 650 mg by mouth daily as needed for pain.   Yes Historical Provider, MD  ammonium lactate (AMLACTIN) 12 % cream Apply 1 g topically 3 (three) times daily as needed for dry skin.    Yes Historical Provider, MD  cetirizine (ZYRTEC) 10 MG tablet Take 10 mg by mouth daily as needed for allergies.   Yes Historical Provider, MD  clobetasol cream (TEMOVATE) 7.03 % Apply 1 application topically 2 (two) times a week.   Yes Historical Provider, MD  HYDROcodone-acetaminophen (NORCO/VICODIN) 5-325 MG tablet Take 1 tablet by mouth every 6 (six) hours as needed. 12/24/16  Yes Malachy Mood, MD  hydroxychloroquine (PLAQUENIL) 200 MG tablet Take 200 mg by mouth 2 (two) times daily.   Yes Historical Provider, MD  ibuprofen (ADVIL,MOTRIN) 800 MG tablet Take 800 mg by mouth every 8 (eight) hours as needed for moderate pain or cramping.   Yes Historical Provider, MD    Physical Exam Vitals: Blood pressure 112/68, pulse 93, height 5\' 3"  (1.6 m), weight  225 lb (102.1 kg), last menstrual period 03/09/2017. General: NAD HEENT: normocephalic, anicteric Pulmonary: No increased work of breathing Cardiovascular: RRR, distal pulses 2+ Abdomen: Soft, non-tender, non-distended Extremities: no edema, erythema, or tenderness Neurologic: Grossly intact Psychiatric: mood appropriate, affect full  Imaging No results found.  Assessment: 46 y.o. [id number] presenting for scheduled hysterosocpy, D&C, and novasure endometrial ablation  Plan: 1) The patient was counseled on the overall effectiveness of endometrial ablation in achieving amenorrhea.  She is aware that some patient may continue to have menstrual cycles although  these are generally greatly reduced in flow.  In addition she was quoted a failure rate for endometrial ablation of approximately 25% within the frist 4 years, but these failures may happen at any time during or after the initial 4 year postop period.  She is aware that pregnancy is contra-indicated in the setting of prior endometrial ablation, and that ablation itself does not confer any contraceptive benefit.  She will therefore need to continue to rely on some means of contraception following the procedure.  Although rare and generally confined to patient who have undergone prior tubal ligatoin, post-ablation tubal sterilizaton syndrome (PATSS) may also occur with no reliable incidence rates as the majority of published literature is limited to case reports.   Prior to being considered a candidate for Novasure ablation she will need to undergo endometrial biopsy to rule out endometrial hyperplasia or malignancy as the cause of her bleeding, have an up to date pap on record, and undergo transvaginal ultrasound to verify the absence of focal endometrial lesion which may need to be addressed prior to proceeding with ablation.  In addition she is aware that the device is limited for use in women with a normal uterine cavity and uterine leiomyomata <3cm in size.  If present leiomyomata may increase the long-term failure rate of the procedure.  In rare instances the presence of a uterine septum or arcuate uterus, which may not be readily apparent on preoperative ultrasound, may necessitate the procedure to be aborted.    2) Routine postoperative instructions were reviewed with the patient and her family in detail today including the expected length of recovery and likely postoperative course.  The patient concurred with the proposed plan, giving informed written consent for the surgery today.  Patient instructed on the importance of being NPO after midnight prior to her procedure.  If warranted preoperative  prophylactic antibiotics and SCDs ordered on call to the OR to meet SCIP guidelines and adhere to recommendation laid forth in [place] Number 104 May 2009  "Antibiotic Prophylaxis for Gynecologic Procedures".

## 2017-03-18 NOTE — Patient Instructions (Signed)
  Your procedure is scheduled on: Mar 25, 2017 (Friday) Report to Same Day Surgery 2nd floor medical mall [facility] Entrance-take elevator on left to 2nd floor.  Check in with surgery information desk.) To find out your arrival time please call [phone] between 1PM - 3PM on Mar 24, 2017 (Thursday)  Remember: Instructions that are not followed completely may result in serious medical risk, up to and including death, or upon the discretion of your surgeon and anesthesiologist your surgery may need to be rescheduled.    _x___ 1. Do not eat food or drink liquids after midnight. No gum chewing or  hard candies                             __x__ 2. No Alcohol for 24 hours before or after surgery.   __x__3. No Smoking for 24 prior to surgery.   ____  4. Bring all medications with you on the day of surgery if instructed.    __x__ 5. Notify your doctor if there is any change in your medical condition     (cold, fever, infections).     Do not wear jewelry, make-up, hairpins, clips or nail polish.  Do not wear lotions, powders, or perfumes. You may wear deodorant.  Do not shave 48 hours prior to surgery. Men may shave face and neck.  Do not bring valuables to the hospital.    [facility] is not responsible for any belongings or valuables.               Contacts, dentures or bridgework may not be worn into surgery.  Leave your suitcase in the car. After surgery it may be brought to your room.  For patients admitted to the hospital, discharge time is determined by your  treatment team                       Patients discharged the day of surgery will not be allowed to drive home.  You will need someone to drive you home and stay with you the night of your procedure.    Please read over the following fact sheets that you were given:   [facility] Preparing for Surgery and or MRSA Information   ____ Take anti-hypertensive (unless it includes a diuretic), cardiac, seizure, asthma,      anti-reflux and psychiatric medicines. These include:  1.   2.  3.  4.  5.  6.  ____Fleets enema or Magnesium Citrate as directed.   ___ Use CHG Soap or sage wipes as directed on instruction sheet   ____ Use inhalers on the day of surgery and bring to hospital day of surgery  ____ Stop Metformin and Janumet 2 days prior to surgery.    ____ Take 1/2 of usual insulin dose the night before surgery and none on the morning     surgery.   _x___ Follow recommendations from Cardiologist, Pulmonologist or PCP regarding          stopping Aspirin, Coumadin, Pllavix ,Eliquis, Effient, or Pradaxa, and Pletal.  X____Stop Anti-inflammatories such as Advil, Aleve, Ibuprofen, Motrin, Naproxen, Naprosyn, Goodies powders or aspirin products. OK to take Tylenol    _x___ Stop supplements until after surgery.  But may continue Vitamin D, Vitamin B, and multivitamin        ____ Bring C-Pap to the hospital.

## 2017-03-25 ENCOUNTER — Ambulatory Visit
Admission: RE | Admit: 2017-03-25 | Discharge: 2017-03-25 | Disposition: A | Discharge status: Home / Self Care | Payer: BLUE CROSS/BLUE SHIELD | Source: Ambulatory Visit | Attending: Obstetrics and Gynecology | Admitting: Obstetrics and Gynecology

## 2017-03-25 ENCOUNTER — Encounter
Admission: RE | Disposition: A | Discharge status: Home / Self Care | Payer: Self-pay | Source: Ambulatory Visit | Attending: Obstetrics and Gynecology

## 2017-03-25 ENCOUNTER — Ambulatory Visit: Payer: BLUE CROSS/BLUE SHIELD | Admitting: Anesthesiology

## 2017-03-25 ENCOUNTER — Encounter: Payer: Self-pay | Admitting: *Deleted

## 2017-03-25 DIAGNOSIS — K219 Gastro-esophageal reflux disease without esophagitis: Secondary | ICD-10-CM | POA: Insufficient documentation

## 2017-03-25 DIAGNOSIS — F988 Other specified behavioral and emotional disorders with onset usually occurring in childhood and adolescence: Secondary | ICD-10-CM | POA: Insufficient documentation

## 2017-03-25 DIAGNOSIS — F329 Major depressive disorder, single episode, unspecified: Secondary | ICD-10-CM | POA: Insufficient documentation

## 2017-03-25 DIAGNOSIS — M329 Systemic lupus erythematosus, unspecified: Secondary | ICD-10-CM | POA: Insufficient documentation

## 2017-03-25 DIAGNOSIS — Z801 Family history of malignant neoplasm of trachea, bronchus and lung: Secondary | ICD-10-CM | POA: Insufficient documentation

## 2017-03-25 DIAGNOSIS — Z8249 Family history of ischemic heart disease and other diseases of the circulatory system: Secondary | ICD-10-CM | POA: Insufficient documentation

## 2017-03-25 DIAGNOSIS — F419 Anxiety disorder, unspecified: Secondary | ICD-10-CM | POA: Insufficient documentation

## 2017-03-25 DIAGNOSIS — Z833 Family history of diabetes mellitus: Secondary | ICD-10-CM | POA: Insufficient documentation

## 2017-03-25 DIAGNOSIS — N920 Excessive and frequent menstruation with regular cycle: Secondary | ICD-10-CM | POA: Insufficient documentation

## 2017-03-25 DIAGNOSIS — F17210 Nicotine dependence, cigarettes, uncomplicated: Secondary | ICD-10-CM | POA: Insufficient documentation

## 2017-03-25 DIAGNOSIS — Z79899 Other long term (current) drug therapy: Secondary | ICD-10-CM | POA: Insufficient documentation

## 2017-03-25 DIAGNOSIS — Z841 Family history of disorders of kidney and ureter: Secondary | ICD-10-CM | POA: Insufficient documentation

## 2017-03-25 DIAGNOSIS — Z9889 Other specified postprocedural states: Secondary | ICD-10-CM

## 2017-03-25 HISTORY — PX: HYSTEROSCOPY WITH NOVASURE: SHX5574

## 2017-03-25 LAB — POCT PREGNANCY, URINE: Preg Test, Ur: NEGATIVE

## 2017-03-25 SURGERY — HYSTEROSCOPY WITH NOVASURE
Anesthesia: General | Site: Vagina | Wound class: Clean Contaminated

## 2017-03-25 MED ORDER — FENTANYL CITRATE (PF) 100 MCG/2ML IJ SOLN
INTRAMUSCULAR | Status: AC
  Administered 2017-03-25: 50 ug via INTRAVENOUS
  Filled 2017-03-25: qty 2

## 2017-03-25 MED ORDER — FENTANYL CITRATE (PF) 100 MCG/2ML IJ SOLN
INTRAMUSCULAR | Status: DC | PRN
  Administered 2017-03-25 (×2): 50 ug via INTRAVENOUS

## 2017-03-25 MED ORDER — ACETAMINOPHEN NICU IV SYRINGE 10 MG/ML
INTRAVENOUS | Status: AC
  Filled 2017-03-25: qty 1

## 2017-03-25 MED ORDER — DEXAMETHASONE SODIUM PHOSPHATE 10 MG/ML IJ SOLN
INTRAMUSCULAR | Status: DC | PRN
  Administered 2017-03-25: 10 mg via INTRAVENOUS

## 2017-03-25 MED ORDER — PROPOFOL 10 MG/ML IV BOLUS
INTRAVENOUS | Status: DC | PRN
  Administered 2017-03-25: 200 mg via INTRAVENOUS

## 2017-03-25 MED ORDER — OXYCODONE HCL 5 MG/5ML PO SOLN: 5.0000 mg | Freq: Once | ORAL | Status: AC | PRN

## 2017-03-25 MED ORDER — OXYCODONE HCL 5 MG PO TABS
5.0000 mg | ORAL_TABLET | Freq: Once | ORAL | Status: AC | PRN
  Administered 2017-03-25: 5 mg via ORAL

## 2017-03-25 MED ORDER — FENTANYL CITRATE (PF) 100 MCG/2ML IJ SOLN
INTRAMUSCULAR | Status: AC
  Filled 2017-03-25: qty 2

## 2017-03-25 MED ORDER — FENTANYL CITRATE (PF) 100 MCG/2ML IJ SOLN
25.0000 ug | INTRAMUSCULAR | Status: DC | PRN
  Administered 2017-03-25 (×2): 50 ug via INTRAVENOUS

## 2017-03-25 MED ORDER — OXYCODONE HCL 5 MG PO TABS
ORAL_TABLET | ORAL | Status: AC
  Administered 2017-03-25: 5 mg via ORAL
  Filled 2017-03-25: qty 1

## 2017-03-25 MED ORDER — LIDOCAINE HCL (CARDIAC) 20 MG/ML IV SOLN
INTRAVENOUS | Status: DC | PRN
  Administered 2017-03-25: 100 mg via INTRAVENOUS

## 2017-03-25 MED ORDER — PROPOFOL 10 MG/ML IV BOLUS
INTRAVENOUS | Status: AC
  Filled 2017-03-25: qty 20

## 2017-03-25 MED ORDER — LACTATED RINGERS IV SOLN
INTRAVENOUS | Status: DC
  Administered 2017-03-25 (×2): via INTRAVENOUS

## 2017-03-25 MED ORDER — MIDAZOLAM HCL 2 MG/2ML IJ SOLN
INTRAMUSCULAR | Status: DC | PRN
Start: 2017-03-25 — End: 2017-03-25
  Administered 2017-03-25: 2 mg via INTRAVENOUS

## 2017-03-25 MED ORDER — MIDAZOLAM HCL 2 MG/2ML IJ SOLN
INTRAMUSCULAR | Status: AC
  Filled 2017-03-25: qty 2

## 2017-03-25 MED ORDER — FAMOTIDINE 20 MG PO TABS
20.0000 mg | ORAL_TABLET | Freq: Once | ORAL | Status: AC
  Administered 2017-03-25: 20 mg via ORAL

## 2017-03-25 MED ORDER — FAMOTIDINE 20 MG PO TABS
ORAL_TABLET | ORAL | Status: AC
Start: 2017-03-25 — End: 2017-03-25
  Administered 2017-03-25: 20 mg via ORAL
  Filled 2017-03-25: qty 1

## 2017-03-25 MED ORDER — IBUPROFEN 800 MG PO TABS: 800.0000 mg | ORAL_TABLET | Freq: Three times a day (TID) | ORAL | 0 refills | Status: DC | PRN

## 2017-03-25 MED ORDER — HYDROCODONE-ACETAMINOPHEN 5-325 MG PO TABS: 1.0000 | ORAL_TABLET | Freq: Four times a day (QID) | ORAL | 0 refills | Status: DC | PRN

## 2017-03-25 MED ORDER — LIDOCAINE HCL (PF) 2 % IJ SOLN
INTRAMUSCULAR | Status: AC
  Filled 2017-03-25: qty 2

## 2017-03-25 SURGICAL SUPPLY — 16 items
ABLATOR ENDOMETRIAL MYOSURE (ABLATOR) ×2 IMPLANT
CATH ROBINSON RED A/P 16FR (CATHETERS) ×2 IMPLANT
GLOVE BIO SURGEON STRL SZ7 (GLOVE) ×2 IMPLANT
GOWN STRL REUS W/ TWL LRG LVL3 (GOWN DISPOSABLE) ×3 IMPLANT
GOWN STRL REUS W/TWL LRG LVL3 (GOWN DISPOSABLE) ×3
IV LACTATED RINGERS 1000ML (IV SOLUTION) ×4 IMPLANT
KIT RM TURNOVER CYSTO AR (KITS) ×2 IMPLANT
NOVASURE ENDOMETRIAL ABLATION (MISCELLANEOUS) ×2 IMPLANT
NS IRRIG 500ML POUR BTL (IV SOLUTION) ×2 IMPLANT
PACK DNC HYST (MISCELLANEOUS) ×2 IMPLANT
PAD OB MATERNITY 4.3X12.25 (PERSONAL CARE ITEMS) ×2 IMPLANT
PAD PREP 24X41 OB/GYN DISP (PERSONAL CARE ITEMS) ×2 IMPLANT
SEAL ROD LENS SCOPE MYOSURE (ABLATOR) ×2 IMPLANT
SLEEVE PROTECTION STRL DISP (MISCELLANEOUS) ×2 IMPLANT
TOWEL OR 17X26 4PK STRL BLUE (TOWEL DISPOSABLE) ×2 IMPLANT
TUBING CONNECTING 10 (TUBING) ×2 IMPLANT

## 2017-03-25 NOTE — Transfer of Care (Signed)
Immediate Anesthesia Transfer of Care Note  Patient: Melinda Ross  Procedure(s) Performed: Procedure(s): HYSTEROSCOPY WITH NOVASURE (N/A)  Patient Location: PACU  Anesthesia Type:General  Level of Consciousness: sedated  Airway & Oxygen Therapy: Patient Spontanous Breathing and Patient connected to face mask oxygen  Post-op Assessment: Report given to RN and Post -op Vital signs reviewed and stable  Post vital signs: Reviewed and stable  Last Vitals:  Vitals:   03/25/17 1114 03/25/17 1310  BP: (!) 134/96 117/85  Pulse: 82 76  Resp: 18 14  Temp: 36.8 C 36.3 C    Last Pain:  Vitals:   03/25/17 1114  TempSrc: Oral         Complications: No apparent anesthesia complications

## 2017-03-25 NOTE — Anesthesia Post-op Follow-up Note (Cosign Needed)
Anesthesia QCDR form completed.        

## 2017-03-25 NOTE — H&P (Signed)
Date of Initial H&P: 03/18/17  History reviewed, patient examined, no change in status, stable for surgery.

## 2017-03-25 NOTE — Anesthesia Postprocedure Evaluation (Signed)
Anesthesia Post Note  Patient: Melinda Ross  Procedure(s) Performed: Procedure(s) (LRB): HYSTEROSCOPY WITH NOVASURE (N/A)  Patient location during evaluation: PACU Anesthesia Type: General Level of consciousness: awake and alert Pain management: pain level controlled Vital Signs Assessment: post-procedure vital signs reviewed and stable Respiratory status: spontaneous breathing, nonlabored ventilation, respiratory function stable and patient connected to nasal cannula oxygen Cardiovascular status: blood pressure returned to baseline and stable Postop Assessment: no signs of nausea or vomiting Anesthetic complications: no     Last Vitals:  Vitals:   03/25/17 1336 03/25/17 1340  BP:  118/81  Pulse: 83 78  Resp: 11 11  Temp:  36.6 C    Last Pain:  Vitals:   03/25/17 1340  TempSrc:   PainSc: 3                  [name]

## 2017-03-25 NOTE — OR Nursing (Signed)
novasure 1 minute 7 seconds

## 2017-03-25 NOTE — Op Note (Signed)
Preoperative Diagnosis: 1) 46 y.o.  with menorrhagia Postoperative Diagnosis: 1) 46 y.o. with menorrhagia  Operation Performed: Hysteroscopy, novasure endometrial ablation  Indication: Patient with menorrhagia.  The patient was previously counseled on the overall effectiveness of the device in achieving amenorrhea.  She is aware that some patient may continue to have menstrual cycles although these are generally greatly reduced in flow.  In addition she was quoted a failure rate for endometrial ablation of approximately 25% within the frist 4 years, but these failures may happen at any time during or after the initial 4 year postop period.  She is aware that pregnancy is contra-indicated in the setting of prior endometrial ablation, and that ablation itself does not confer and contraceptive benefits and that she will need to continue to rely on some means of contraception following the procedure.  Although rare and generally confined to patient who have undergone prior tubal ligatoin, post-ablation tubal sterilizaton syndrome (PATSS) may also occur.   Surgeon: Malachy Mood, MD  Anesthesia: General  Preoperative Antibiotics: none  Estimated Blood Loss: 77mL  IV Fluids: 621mL  Urine Output:: 69mL  Drains or Tubes: none  Implants: none  Specimens Removed: none  Complications: none  Intraoperative Findings: Normal appearing vagina mucosa and cervix.  Normal uterine cavity.  Good coverage of the uterine cavity on post-tablation hysteroscopy with ablation of the uterine cornua and sparing of the endocervix.   Patient Condition: stable  Procedure in Detail:  INDINGS: Exam under anesthesia revealed small, mobile uterus with no masses and bilateral adnexa without masses or fullness. Hysteroscopy revealed normal cavity, otherwise grossly normal appearing uterine cavity with bilateral tubal ostia and normal appearing endocervical canal. Findings after ablation revealed globally ablated  endometrium, sparing of the endocervix.  PROCEDURE IN DETAIL: After informed consent was obtained, the patient was taken to the operating room where anesthesia was obtained without difficulty. The patient was positioned in the dorsal lithotomy position using allen stirrups. The patient's bladder was decompressed using a red rubber catheter. The patient was examined under anesthesia, with the above noted findings.  An operative speculum was placed inside the patient's vagina, the cervix was adequately visualized, and the the anterior lip of the cervix was grasped with a single tooth tenaculum. The uterine cavity was sounded to 11 cm, and then the cervix was progressively dilated to  61mm using Pratt dilators. The 0 degree hysteroscope was introduced through the cervix and advanced under direct visualization in the uterine cavity.  Normal saline was used as the distending fluid during the hysteroscopy.  This revealed the above noted intracavitary findings. The cervical length was obtained by retracting the hysteroscope to the level of the internal cervical os and marking that point on the hysteroscopy.  This measurement yielded a value of 6cm, yielding a total cavity length of 5cm based on the previously obtained sounding measurement  The hystersocope was removed and the uterine cavity was curetted until a gritty texture was noted, the resulting endometrial curetting was sent to pathology.    The NovaSure device was then placed without difficulty. The Novasure array was deployed and seated, with a resulting uterine width of 3.8cm. The NovaSure device passed the cavity assessment test.  Following this the device was activaed at a power of with a total ablation time of 67 seconds. The NovaSure device was removed and repeat hysteroscopy reveals an appropriate lining of the uterus and no perforation or injury. Hysteroscope is removed with minimal discrepancy of fluid.   Tenaculum was removed  with excellent  hemostasis noted after application of silver nitrate to the tenaculum entry sites. She was then taken out of dorsal lithotomy. Hemostasis noted.  The patient tolerated the procedure well. Sponge, lap and needle counts were correct times two. The patient was taken to recovery room in excellent condition.

## 2017-03-25 NOTE — Anesthesia Procedure Notes (Signed)
Procedure Name: LMA Insertion Date/Time: 03/25/2017 12:14 PM Performed by: [facility] Pre-anesthesia Checklist: Patient identified, Emergency Drugs available, Suction available and Patient being monitored Patient Re-evaluated:Patient Re-evaluated prior to inductionOxygen Delivery Method: Circle system utilized Preoxygenation: Pre-oxygenation with 100% oxygen Intubation Type: IV induction Ventilation: Mask ventilation without difficulty LMA Size: 3.5 Number of attempts: 1 Dental Injury: Teeth and Oropharynx as per pre-operative assessment

## 2017-03-25 NOTE — Discharge Instructions (Signed)
Hysteroscopy, Care After  Refer to this sheet in the next few weeks. These instructions provide you with information on caring for yourself after your procedure. Your health care provider may also give you more specific instructions. Your treatment has been planned according to current medical practices, but problems sometimes occur. Call your health care provider if you have any problems or questions after your procedure.  What can I expect after the procedure?  After your procedure, it is typical to have the following:  · You may have some cramping. This normally lasts for a couple days.  · You may have bleeding. This can vary from light spotting for a few days to menstrual-like bleeding for 3-7 days.    Follow these instructions at home:  · Rest for the first 1-2 days after the procedure.  · Only take over-the-counter or prescription medicines as directed by your health care provider. Do not take aspirin. It can increase the chances of bleeding.  · Take showers instead of baths for 2 weeks or as directed by your health care provider.  · Do not drive for 24 hours or as directed.  · Do not drink alcohol while taking pain medicine.  · Do not use tampons, douche, or have sexual intercourse for 2 weeks or until your health care provider says it is okay.  · Take your temperature twice a day for 4-5 days. Write it down each time.  · Follow your health care provider's advice about diet, exercise, and lifting.  · If you develop constipation, you may:  ? Take a mild laxative if your health care provider approves.  ? Add bran foods to your diet.  ? Drink enough fluids to keep your urine clear or pale yellow.  · Try to have someone with you or available to you for the first 24-48 hours, especially if you were given a general anesthetic.  · Follow up with your health care provider as directed.  Contact a health care provider if:  · You feel dizzy or lightheaded.  · You feel sick to your stomach (nauseous).  · You have  abnormal vaginal discharge.  · You have a rash.  · You have pain that is not controlled with medicine.  Get help right away if:  · You have bleeding that is heavier than a normal menstrual period.  · You have a fever.  · You have increasing cramps or pain, not controlled with medicine.  · You have new belly (abdominal) pain.  · You pass out.  · You have pain in the tops of your shoulders (shoulder strap areas).  · You have shortness of breath.  This information is not intended to replace advice given to you by your health care provider. Make sure you discuss any questions you have with your health care provider.  Document Released: 08/29/2013 Document Revised: 04/15/2016 Document Reviewed: 06/07/2013  [employer]vier Interactive Patient Education © 2017 [employer]

## 2017-03-25 NOTE — Anesthesia Preprocedure Evaluation (Signed)
Anesthesia Evaluation  Patient identified by MRN, date of birth, ID band Patient awake    Reviewed: Allergy & Precautions, H&P , NPO status , Patient's Chart, lab work & pertinent test results  History of Anesthesia Complications Negative for: history of anesthetic complications  Airway Mallampati: III  TM Distance: >3 FB Neck ROM: full    Dental  (+) Chipped, Caps   Pulmonary neg shortness of breath, Current Smoker,    Pulmonary exam normal breath sounds clear to auscultation       Cardiovascular (-) angina(-) Past MI and (-) DOE negative cardio ROS Normal cardiovascular exam Rhythm:regular Rate:Normal     Neuro/Psych PSYCHIATRIC DISORDERS negative neurological ROS     GI/Hepatic negative GI ROS, Neg liver ROS, neg GERD  ,  Endo/Other  negative endocrine ROS  Renal/GU      Musculoskeletal   Abdominal   Peds  Hematology negative hematology ROS (+)   Anesthesia Other Findings Signs and symptoms suggestive of sleep apnea   Past Medical History: No date: Anemia No date: Anxiety No date: Attention deficit disorder (ADD) 2015: Genital herpes No date: Lupus     Comment: SEES DR Precious Reel  Discoid No date: Mild depression ([place]) No date: Numbness  Past Surgical History: 2003: CESAREAN SECTION     Comment: [facility] No date: DG TOES*L* Bilateral 12/24/2016: DILATATION & CURETTAGE/HYSTEROSCOPY WITH MYOSU* N/A     Comment: Procedure: DILATATION & CURETTAGE/HYSTEROSCOPY              WITH MYOSURE;  Surgeon: Malachy Mood, MD;                Location: [facility] ORS;  Service: Gynecology;                Laterality: N/A; No date: DILATION AND CURETTAGE OF UTERUS 2003: TUBAL LIGATION     Comment: [facility]  BMI    Body Mass Index:  39.68 kg/m      Reproductive/Obstetrics negative OB ROS                             Anesthesia Physical Anesthesia Plan  ASA:  III  Anesthesia Plan: General   Post-op Pain Management:    Induction: Intravenous  Airway Management Planned: LMA  Additional Equipment:   Intra-op Plan:   Post-operative Plan: Extubation in OR  Informed Consent: I have reviewed the patients History and Physical, chart, labs and discussed the procedure including the risks, benefits and alternatives for the proposed anesthesia with the patient or authorized representative who has indicated his/her understanding and acceptance.   Dental Advisory Given  Plan Discussed with: Anesthesiologist, CRNA and Surgeon  Anesthesia Plan Comments: (Patient consented for risks of anesthesia including but not limited to:  - adverse reactions to medications - damage to teeth, lips or other oral mucosa - sore throat or hoarseness - Damage to heart, brain, lungs or loss of life  Patient voiced understanding.)        Anesthesia Quick Evaluation

## 2017-03-29 ENCOUNTER — Other Ambulatory Visit: Payer: Self-pay | Admitting: Family Medicine

## 2017-03-29 DIAGNOSIS — Z1231 Encounter for screening mammogram for malignant neoplasm of breast: Secondary | ICD-10-CM

## 2017-03-30 ENCOUNTER — Encounter: Payer: Self-pay | Admitting: Obstetrics and Gynecology

## 2017-03-30 ENCOUNTER — Emergency Department
Admission: EM | Admit: 2017-03-30 | Discharge: 2017-03-30 | Discharge status: Against Medical Advice | Payer: BLUE CROSS/BLUE SHIELD

## 2017-03-30 ENCOUNTER — Ambulatory Visit (INDEPENDENT_AMBULATORY_CARE_PROVIDER_SITE_OTHER): Payer: BLUE CROSS/BLUE SHIELD | Admitting: Obstetrics and Gynecology

## 2017-03-30 VITALS — BP 118/70 | HR 67 | Wt 224.0 lb

## 2017-03-30 DIAGNOSIS — Z4889 Encounter for other specified surgical aftercare: Secondary | ICD-10-CM

## 2017-03-30 NOTE — Progress Notes (Signed)
      Postoperative Follow-up Patient presents post op from Novasure endometrial ablation 1weeks ago for abnormal uterine bleeding.  Subjective: Patient reports marked improvement in her preop symptoms. Eating a regular diet without difficulty. The patient is not having any pain.  Activity: normal activities of daily living.  Objective: Vitals:   03/30/17 1004  BP: 118/70  Pulse: 67   Gen: NAD Psych: mood appropriate, affect full  Assessment: 46 y.o. s/p Novasure ablation progressing well  Plan: Patient has done well after surgery with no apparent complications.  I have discussed the post-operative course to date, and the expected progress moving forward.  The patient understands what complications to be concerned about.  I will see the patient in routine follow up, or sooner if needed.    Activity plan: No restriction.  Intraoperative findings and pictures shared with patient.  Discussed expected recovery follow up in 5 weeks  Malachy Mood 03/30/2017, 4:43 PM

## 2017-03-30 NOTE — ED Notes (Signed)
Pt came to registration desk and states she is leaving

## 2017-04-01 ENCOUNTER — Emergency Department: Payer: BLUE CROSS/BLUE SHIELD

## 2017-04-01 ENCOUNTER — Emergency Department
Admission: EM | Admit: 2017-04-01 | Discharge: 2017-04-01 | Disposition: A | Discharge status: Home / Self Care | Payer: BLUE CROSS/BLUE SHIELD | Attending: Emergency Medicine | Admitting: Emergency Medicine

## 2017-04-01 DIAGNOSIS — R110 Nausea: Secondary | ICD-10-CM

## 2017-04-01 DIAGNOSIS — R1011 Right upper quadrant pain: Secondary | ICD-10-CM

## 2017-04-01 DIAGNOSIS — Z79899 Other long term (current) drug therapy: Secondary | ICD-10-CM | POA: Insufficient documentation

## 2017-04-01 DIAGNOSIS — F17210 Nicotine dependence, cigarettes, uncomplicated: Secondary | ICD-10-CM | POA: Diagnosis not present

## 2017-04-01 DIAGNOSIS — R109 Unspecified abdominal pain: Secondary | ICD-10-CM | POA: Diagnosis not present

## 2017-04-01 DIAGNOSIS — F909 Attention-deficit hyperactivity disorder, unspecified type: Secondary | ICD-10-CM | POA: Insufficient documentation

## 2017-04-01 DIAGNOSIS — R1013 Epigastric pain: Secondary | ICD-10-CM | POA: Diagnosis not present

## 2017-04-01 LAB — URINALYSIS, COMPLETE (UACMP) WITH MICROSCOPIC
Bilirubin Urine: NEGATIVE
Glucose, UA: NEGATIVE mg/dL
Ketones, ur: NEGATIVE mg/dL
Nitrite: NEGATIVE
PROTEIN: NEGATIVE mg/dL
SPECIFIC GRAVITY, URINE: 1.005 (ref 1.005–1.030)
pH: 6 (ref 5.0–8.0)

## 2017-04-01 LAB — COMPREHENSIVE METABOLIC PANEL
ALK PHOS: 66 U/L (ref 38–126)
ALT: 14 U/L (ref 14–54)
AST: 32 U/L (ref 15–41)
Albumin: 3.3 g/dL — ABNORMAL LOW (ref 3.5–5.0)
Anion gap: 6 (ref 5–15)
BUN: 8 mg/dL (ref 6–20)
CALCIUM: 8.3 mg/dL — AB (ref 8.9–10.3)
CHLORIDE: 108 mmol/L (ref 101–111)
CO2: 22 mmol/L (ref 22–32)
CREATININE: 0.75 mg/dL (ref 0.44–1.00)
GFR calc non Af Amer: >60 mL/min (ref >60)
Glucose, Bld: 129 mg/dL — ABNORMAL HIGH (ref 65–99)
Potassium: 3.4 mmol/L — ABNORMAL LOW (ref 3.5–5.1)
SODIUM: 136 mmol/L (ref 135–145)
Total Bilirubin: 0.5 mg/dL (ref 0.3–1.2)
Total Protein: 6.8 g/dL (ref 6.5–8.1)

## 2017-04-01 LAB — CBC
HCT: 32.8 % — ABNORMAL LOW (ref 35.0–47.0)
Hemoglobin: 11.1 g/dL — ABNORMAL LOW (ref 12.0–16.0)
MCH: 29.9 pg (ref 26.0–34.0)
MCHC: 33.7 g/dL (ref 32.0–36.0)
MCV: 88.7 fL (ref 80.0–100.0)
Platelets: 234 10*3/uL (ref 150–440)
RBC: 3.69 MIL/uL — AB (ref 3.80–5.20)
RDW: 15.6 % — ABNORMAL HIGH (ref 11.5–14.5)
WBC: 6.1 10*3/uL (ref 3.6–11.0)

## 2017-04-01 LAB — LIPASE, BLOOD: Lipase: 40 U/L (ref 11–51)

## 2017-04-01 MED ORDER — GI COCKTAIL ~~LOC~~
30.0000 mL | Freq: Once | ORAL | Status: AC
  Administered 2017-04-01: 30 mL via ORAL

## 2017-04-01 MED ORDER — GI COCKTAIL ~~LOC~~
ORAL | Status: AC
  Filled 2017-04-01: qty 30

## 2017-04-01 MED ORDER — IOPAMIDOL (ISOVUE-370) INJECTION 76%
100.0000 mL | Freq: Once | INTRAVENOUS | Status: AC | PRN
  Administered 2017-04-01: 100 mL via INTRAVENOUS

## 2017-04-01 MED ORDER — OMEPRAZOLE 40 MG PO CPDR: 40.0000 mg | DELAYED_RELEASE_CAPSULE | Freq: Every day | ORAL | 0 refills | Status: DC

## 2017-04-01 MED ORDER — MORPHINE SULFATE (PF) 2 MG/ML IV SOLN
2.0000 mg | Freq: Once | INTRAVENOUS | Status: AC
  Administered 2017-04-01: 2 mg via INTRAVENOUS
  Filled 2017-04-01: qty 1

## 2017-04-01 MED ORDER — ONDANSETRON HCL 4 MG/2ML IJ SOLN
4.0000 mg | Freq: Once | INTRAMUSCULAR | Status: AC
  Administered 2017-04-01: 4 mg via INTRAVENOUS
  Filled 2017-04-01: qty 2

## 2017-04-01 NOTE — ED Triage Notes (Signed)
Pt in with co epigastric pain that started Wednesday, went to urgent care who told her to come here Wednesday but states wait was too long. Pt here for persistent pain, states it feel sharp. Pt had an ablation done last week, states has had no complications from the procedure.

## 2017-04-01 NOTE — ED Provider Notes (Signed)
[facility] Emergency Department Provider Note __   First MD Initiated Contact with Patient 04/01/17 [phone]     (approximate)  I have reviewed the triage vital signs and the nursing notes.   HISTORY  Chief Complaint Abdominal Pain    HPI Melinda Ross is a 46 y.o. female below list of chronic medical conditions presents to the emergency department with epigastric abdominal pain intermittently 3 days. Patient states that when the pain is at maximum intensity it is sharp and 9 out of 10. Current pain score is 6 out of 10. Patient admits to nausea however no vomiting. Patient denies any diarrhea. Patient denies any urinary symptoms. Patient denies any fever   Past Medical History:  Diagnosis Date  . Anemia   . Anxiety   . Attention deficit disorder (ADD)   . Genital herpes 2015  . 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 Mild depression ([place])   . Numbness     Patient Active Problem List   Diagnosis Date Noted  . 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 Numbness     Past Surgical History:  Procedure Laterality Date  . CESAREAN SECTION  2003   [facility]  . DG TOES*L* Bilateral   . DILATATION & CURETTAGE/HYSTEROSCOPY WITH MYOSURE N/A 12/24/2016   Procedure: DILATATION & CURETTAGE/HYSTEROSCOPY WITH MYOSURE;  Surgeon: Malachy Mood, MD;  Location: [facility] ORS;  Service: Gynecology;  Laterality: N/A;  . DILATION AND CURETTAGE OF UTERUS    . HYSTEROSCOPY WITH NOVASURE N/A 03/25/2017   Procedure: HYSTEROSCOPY WITH NOVASURE;  Surgeon: Malachy Mood, MD;  Location: [facility] ORS;  Service: Gynecology;  Laterality: N/A;  . TUBAL LIGATION  2003   [facility]    Prior to Admission medications   Medication Sig Start Date End Date Taking? Authorizing Provider  acetaminophen (ACETAMINOPHEN 8 HOUR) 650 MG CR tablet Take 650 mg by mouth daily as needed for pain.   Yes [provider]  ammonium lactate (AMLACTIN) 12 % cream Apply 1 g topically 3  (three) times daily as needed for dry skin.    Yes [provider]  cetirizine (ZYRTEC) 10 MG tablet Take 10 mg by mouth daily as needed for allergies.   Yes [provider]  clobetasol cream (TEMOVATE) 4.49 % Apply 1 application topically 2 (two) times a week.   Yes [provider]  HYDROcodone-acetaminophen (NORCO/VICODIN) 5-325 MG tablet Take 1 tablet by mouth every 6 (six) hours as needed. 03/25/17  Yes Malachy Mood, MD  hydroxychloroquine (PLAQUENIL) 200 MG tablet Take 200 mg by mouth 2 (two) times daily.   Yes [provider]  ibuprofen (ADVIL,MOTRIN) 800 MG tablet Take 1 tablet (800 mg total) by mouth every 8 (eight) hours as needed for moderate pain or cramping. 03/25/17  Yes Malachy Mood, MD    Allergies No known drug allergies  Family History  Problem Relation Age of Onset  . High blood pressure Brother   . Diabetes Brother   . Kidney disease Brother   . Lung cancer Mother     Social History Social History  Substance Use Topics  . Smoking status: Current Every Day Smoker    Packs/day: 1.00    Years: 23.00    Types: Cigarettes  . Smokeless tobacco: Never Used  . Alcohol use Yes     Comment: social    Review of Systems Constitutional: No fever/chills Eyes: No visual changes. ENT: No sore throat. Cardiovascular: Denies chest pain. Respiratory: Denies shortness  of breath. Gastrointestinal: Positive abdominal pain.  Positive nausea, no vomiting.  No diarrhea.  No constipation. Genitourinary: Negative for dysuria. Musculoskeletal: Negative for back pain. Integumentary: Negative for rash. Neurological: Negative for headaches, focal weakness or numbness.   ____________________________________________   PHYSICAL EXAM:  VITAL SIGNS: ED Triage Vitals  Enc Vitals Group     BP 04/01/17 0402 139/73     Pulse Rate 04/01/17 0402 89     Resp 04/01/17 0402 18     Temp 04/01/17 0402 98.9 F (37.2 C)     Temp Source 04/01/17  0402 Oral     SpO2 04/01/17 0402 100 %     Weight 04/01/17 0403 224 lb (101.6 kg)     Height 04/01/17 0403 5\' 3"  (1.6 m)     Head Circumference --      Peak Flow --      Pain Score 04/01/17 0401 6     Pain Loc --      Pain Edu? --      Excl. in [place]? --     Constitutional: Alert and oriented. Well appearing and in no acute distress. Eyes: Conjunctivae are normal. PERRL. EOMI. Head: Atraumatic. Mouth/Throat: Mucous membranes are moist.  Oropharynx non-erythematous. Neck: No stridor.   Cardiovascular: Normal rate, regular rhythm. Good peripheral circulation. Grossly normal heart sounds. Respiratory: Normal respiratory effort.  No retractions. Lungs CTAB. Gastrointestinal: Positive for epigastric pain No distention.  Musculoskeletal: No lower extremity tenderness nor edema. No gross deformities of extremities. Neurologic:  Normal speech and language. No gross focal neurologic deficits are appreciated.  Skin:  Skin is warm, dry and intact. No rash noted. Psychiatric: Mood and affect are normal. Speech and behavior are normal.   RADIOLOGY I, Pierce, personally viewed and evaluated these images (plain radiographs) as part of my medical decision making, as well as reviewing the written report by the radiologist.  Ct Abdomen Pelvis W Contrast  Result Date: 04/01/2017 CLINICAL DATA:  46 y/o F; right upper quadrant and epigastric abdominal pain. EXAM: CT ABDOMEN AND PELVIS WITH CONTRAST TECHNIQUE: Multidetector CT imaging of the abdomen and pelvis was performed using the standard protocol following bolus administration of intravenous contrast. CONTRAST:  100 cc Isovue 370 COMPARISON:  None. FINDINGS: Lower chest: No acute abnormality. Hepatobiliary: No focal liver abnormality is seen. No gallstones, gallbladder wall thickening, or biliary dilatation. Pancreas: Unremarkable. No pancreatic ductal dilatation or surrounding inflammatory changes. Spleen: Normal in size without focal  abnormality. Adrenals/Urinary Tract: Adrenal glands are unremarkable. Kidneys are normal, without renal calculi, focal lesion, or hydronephrosis. Bladder is unremarkable. Stomach/Bowel: Stomach is within normal limits. Appendix appears normal. No evidence of bowel wall thickening, distention, or inflammatory changes. Vascular/Lymphatic: Aortic atherosclerosis with minimal calcification. No enlarged abdominal or pelvic lymph nodes. 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 Air and fluid within the endometrial cavity compatible with recent endometrial ablation. Simple left ovarian cyst measuring up to 2.6 cm. Other: No abdominal wall hernia or abnormality. No abdominopelvic ascites. Musculoskeletal: No acute or significant osseous findings. IMPRESSION: 1. No acute process identified as explanation for abdominal pain. 2. Air and fluid within the endometrial cavity compatible with recent ablation. 3. 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 Electronically Signed   By: Kristine Garbe M.D.   On: 04/01/2017 06:23   US Abdomen Limited Ruq  Result Date: 04/01/2017 CLINICAL DATA:  46 y/o  F; right upper quadrant pain for 2 days. EXAM: US ABDOMEN LIMITED - RIGHT UPPER QUADRANT COMPARISON:  None. FINDINGS: Gallbladder: No gallstones or wall thickening visualized. No sonographic Murphy sign noted by sonographer. Common bile duct: Diameter: 3.9 mm Liver: No focal lesion identified. Within normal limits in parenchymal echogenicity. IMPRESSION: Normal right upper quadrant ultrasound. Electronically Signed   By: Kristine Garbe M.D.   On: 04/01/2017 05:01     Procedures   ____________________________________________   INITIAL IMPRESSION / ASSESSMENT AND PLAN / ED COURSE  Pertinent labs & imaging results that were available during my care of the patient were reviewed by me and considered in my medical decision making (see chart for details).  Patient given GI cocktail and  morphine and Zofran emergency Department with improvement of pain. Ultrasound CT scan revealed no etiology for the patient's abdominal pain concern for possible ulcer disease. Patient will be referred to gastroenterologist Dr. Tarri Glenn for further outpatient evaluation and management.      ____________________________________________  FINAL CLINICAL IMPRESSION(S) / ED DIAGNOSES  Final diagnoses:  Nausea  RUQ pain  Epigastric pain     MEDICATIONS GIVEN DURING THIS VISIT:  Medications  morphine 2 MG/ML injection 2 mg (2 mg Intravenous Given 04/01/17 0433)  ondansetron (ZOFRAN) injection 4 mg (4 mg Intravenous Given 04/01/17 0433)  gi cocktail (Maalox,Lidocaine,Donnatal) (30 mLs Oral Given 04/01/17 0544)  iopamidol (ISOVUE-370) 76 % injection 100 mL (100 mLs Intravenous Contrast Given 04/01/17 0552)     NEW OUTPATIENT MEDICATIONS STARTED DURING THIS VISIT:  New Prescriptions   No medications on file    Modified Medications   No medications on file    Discontinued Medications   No medications on file     Note:  This document was prepared using Dragon voice recognition software and may include unintentional dictation errors.    Gregor Hams, MD 04/01/17 [id number]

## 2017-04-20 ENCOUNTER — Ambulatory Visit: Payer: BLUE CROSS/BLUE SHIELD | Admitting: Gastroenterology

## 2017-05-04 ENCOUNTER — Ambulatory Visit: Payer: BLUE CROSS/BLUE SHIELD | Admitting: Obstetrics and Gynecology

## 2017-10-18 DIAGNOSIS — H6123 Impacted cerumen, bilateral: Secondary | ICD-10-CM | POA: Diagnosis not present

## 2017-10-18 DIAGNOSIS — H6062 Unspecified chronic otitis externa, left ear: Secondary | ICD-10-CM | POA: Diagnosis not present

## 2017-10-18 DIAGNOSIS — H93299 Other abnormal auditory perceptions, unspecified ear: Secondary | ICD-10-CM | POA: Diagnosis not present

## 2017-12-16 ENCOUNTER — Other Ambulatory Visit: Payer: Self-pay

## 2017-12-16 ENCOUNTER — Emergency Department: Payer: BLUE CROSS/BLUE SHIELD

## 2017-12-16 ENCOUNTER — Encounter: Payer: Self-pay | Admitting: Emergency Medicine

## 2017-12-16 ENCOUNTER — Inpatient Hospital Stay
Admission: EM | Admit: 2017-12-16 | Discharge: 2017-12-17 | DRG: 176 | Disposition: A | Discharge status: Home / Self Care | Payer: BLUE CROSS/BLUE SHIELD | Attending: Internal Medicine | Admitting: Internal Medicine

## 2017-12-16 DIAGNOSIS — E876 Hypokalemia: Secondary | ICD-10-CM | POA: Diagnosis not present

## 2017-12-16 DIAGNOSIS — I2699 Other pulmonary embolism without acute cor pulmonale: Secondary | ICD-10-CM | POA: Diagnosis not present

## 2017-12-16 DIAGNOSIS — Z79899 Other long term (current) drug therapy: Secondary | ICD-10-CM

## 2017-12-16 DIAGNOSIS — A6000 Herpesviral infection of urogenital system, unspecified: Secondary | ICD-10-CM | POA: Diagnosis present

## 2017-12-16 DIAGNOSIS — F419 Anxiety disorder, unspecified: Secondary | ICD-10-CM | POA: Diagnosis present

## 2017-12-16 DIAGNOSIS — L930 Discoid lupus erythematosus: Secondary | ICD-10-CM | POA: Diagnosis not present

## 2017-12-16 DIAGNOSIS — Z8249 Family history of ischemic heart disease and other diseases of the circulatory system: Secondary | ICD-10-CM | POA: Diagnosis not present

## 2017-12-16 DIAGNOSIS — R0781 Pleurodynia: Secondary | ICD-10-CM | POA: Diagnosis not present

## 2017-12-16 DIAGNOSIS — F17210 Nicotine dependence, cigarettes, uncomplicated: Secondary | ICD-10-CM | POA: Diagnosis not present

## 2017-12-16 DIAGNOSIS — Z9114 Patient's other noncompliance with medication regimen: Secondary | ICD-10-CM | POA: Diagnosis not present

## 2017-12-16 DIAGNOSIS — I824Z2 Acute embolism and thrombosis of unspecified deep veins of left distal lower extremity: Secondary | ICD-10-CM | POA: Diagnosis not present

## 2017-12-16 DIAGNOSIS — I82432 Acute embolism and thrombosis of left popliteal vein: Secondary | ICD-10-CM | POA: Diagnosis not present

## 2017-12-16 DIAGNOSIS — I10 Essential (primary) hypertension: Secondary | ICD-10-CM | POA: Diagnosis present

## 2017-12-16 DIAGNOSIS — R079 Chest pain, unspecified: Secondary | ICD-10-CM | POA: Diagnosis not present

## 2017-12-16 HISTORY — DX: Systemic involvement of connective tissue, unspecified: M35.9

## 2017-12-16 LAB — PROTIME-INR
INR: 1.03
PROTHROMBIN TIME: 13.4 s (ref 11.4–15.2)

## 2017-12-16 LAB — BASIC METABOLIC PANEL
Anion gap: 8 (ref 5–15)
BUN: 12 mg/dL (ref 6–20)
CHLORIDE: 109 mmol/L (ref 101–111)
CO2: 21 mmol/L — AB (ref 22–32)
CREATININE: 0.86 mg/dL (ref 0.44–1.00)
Calcium: 8.8 mg/dL — ABNORMAL LOW (ref 8.9–10.3)
GFR calc non Af Amer: >60 mL/min (ref >60)
GLUCOSE: 118 mg/dL — AB (ref 65–99)
Potassium: 4.2 mmol/L (ref 3.5–5.1)
Sodium: 138 mmol/L (ref 135–145)

## 2017-12-16 LAB — CBC
HCT: 40.6 % (ref 35.0–47.0)
Hemoglobin: 13.6 g/dL (ref 12.0–16.0)
MCH: 31.5 pg (ref 26.0–34.0)
MCHC: 33.4 g/dL (ref 32.0–36.0)
MCV: 94.1 fL (ref 80.0–100.0)
PLATELETS: 221 10*3/uL (ref 150–440)
RBC: 4.31 MIL/uL (ref 3.80–5.20)
RDW: 15.7 % — ABNORMAL HIGH (ref 11.5–14.5)
WBC: 6.9 10*3/uL (ref 3.6–11.0)

## 2017-12-16 LAB — FIBRIN DERIVATIVES D-DIMER (ARMC ONLY): Fibrin derivatives D-dimer (ARMC): 2054.91 ng/mL (FEU) — ABNORMAL HIGH (ref 0.00–499.00)

## 2017-12-16 LAB — TROPONIN I: Troponin I: <0.03 ng/mL (ref <0.03)

## 2017-12-16 LAB — APTT: aPTT: 30 seconds (ref 24–36)

## 2017-12-16 MED ORDER — TRAZODONE HCL 50 MG PO TABS: 25.0000 mg | ORAL_TABLET | Freq: Every evening | ORAL | Status: DC | PRN

## 2017-12-16 MED ORDER — ACETAMINOPHEN 325 MG PO TABS: 650.0000 mg | ORAL_TABLET | Freq: Four times a day (QID) | ORAL | Status: DC | PRN

## 2017-12-16 MED ORDER — IPRATROPIUM-ALBUTEROL 0.5-2.5 (3) MG/3ML IN SOLN
3.0000 mL | Freq: Four times a day (QID) | RESPIRATORY_TRACT | Status: DC
  Administered 2017-12-17 (×2): 3 mL via RESPIRATORY_TRACT
  Filled 2017-12-16 (×2): qty 3

## 2017-12-16 MED ORDER — PANTOPRAZOLE SODIUM 40 MG PO TBEC
40.0000 mg | DELAYED_RELEASE_TABLET | Freq: Every day | ORAL | Status: DC
  Administered 2017-12-17: 40 mg via ORAL
  Filled 2017-12-16: qty 1

## 2017-12-16 MED ORDER — LORATADINE 10 MG PO TABS
10.0000 mg | ORAL_TABLET | Freq: Every day | ORAL | Status: DC
  Filled 2017-12-16: qty 1

## 2017-12-16 MED ORDER — IOPAMIDOL (ISOVUE-370) INJECTION 76%
75.0000 mL | Freq: Once | INTRAVENOUS | Status: AC | PRN
  Administered 2017-12-16: 75 mL via INTRAVENOUS
  Filled 2017-12-16: qty 75

## 2017-12-16 MED ORDER — HYDROCODONE-ACETAMINOPHEN 5-325 MG PO TABS
1.0000 | ORAL_TABLET | Freq: Four times a day (QID) | ORAL | Status: DC | PRN
  Administered 2017-12-16: 1 via ORAL
  Filled 2017-12-16: qty 1

## 2017-12-16 MED ORDER — IBUPROFEN 800 MG PO TABS
800.0000 mg | ORAL_TABLET | Freq: Three times a day (TID) | ORAL | Status: DC | PRN
  Filled 2017-12-16: qty 1

## 2017-12-16 MED ORDER — AMMONIUM LACTATE 12 % EX CREA
1.0000 g | TOPICAL_CREAM | Freq: Three times a day (TID) | CUTANEOUS | Status: DC | PRN
  Filled 2017-12-16: qty 140

## 2017-12-16 MED ORDER — HYDROCODONE-ACETAMINOPHEN 5-325 MG PO TABS: 1.0000 | ORAL_TABLET | ORAL | Status: DC | PRN

## 2017-12-16 MED ORDER — ONDANSETRON HCL 4 MG/2ML IJ SOLN: 4.0000 mg | Freq: Four times a day (QID) | INTRAMUSCULAR | Status: DC | PRN

## 2017-12-16 MED ORDER — HYDROXYCHLOROQUINE SULFATE 200 MG PO TABS
200.0000 mg | ORAL_TABLET | Freq: Two times a day (BID) | ORAL | Status: DC
  Administered 2017-12-17: 200 mg via ORAL
  Filled 2017-12-16 (×2): qty 1

## 2017-12-16 MED ORDER — HEPARIN (PORCINE) IN NACL 100-0.45 UNIT/ML-% IJ SOLN
1300.0000 [IU]/h | INTRAMUSCULAR | Status: DC
  Administered 2017-12-16: 1300 [IU]/h via INTRAVENOUS
  Filled 2017-12-16: qty 250

## 2017-12-16 MED ORDER — NICOTINE 7 MG/24HR TD PT24
7.0000 mg | MEDICATED_PATCH | Freq: Every day | TRANSDERMAL | Status: DC
  Filled 2017-12-16: qty 1

## 2017-12-16 MED ORDER — BISACODYL 5 MG PO TBEC: 5.0000 mg | DELAYED_RELEASE_TABLET | Freq: Every day | ORAL | Status: DC | PRN

## 2017-12-16 MED ORDER — ACETAMINOPHEN 650 MG RE SUPP: 650.0000 mg | Freq: Four times a day (QID) | RECTAL | Status: DC | PRN

## 2017-12-16 MED ORDER — DOCUSATE SODIUM 100 MG PO CAPS
100.0000 mg | ORAL_CAPSULE | Freq: Two times a day (BID) | ORAL | Status: DC
  Filled 2017-12-16: qty 1

## 2017-12-16 MED ORDER — OXYCODONE-ACETAMINOPHEN 5-325 MG PO TABS: 2.0000 | ORAL_TABLET | Freq: Once | ORAL | Status: DC

## 2017-12-16 MED ORDER — HEPARIN BOLUS VIA INFUSION
4700.0000 [IU] | Freq: Once | INTRAVENOUS | Status: AC
  Administered 2017-12-16: 4700 [IU] via INTRAVENOUS
  Filled 2017-12-16: qty 4700

## 2017-12-16 MED ORDER — IBUPROFEN 800 MG PO TABS
800.0000 mg | ORAL_TABLET | Freq: Once | ORAL | Status: AC
  Administered 2017-12-16: 800 mg via ORAL
  Filled 2017-12-16: qty 1

## 2017-12-16 MED ORDER — ONDANSETRON HCL 4 MG PO TABS: 4.0000 mg | ORAL_TABLET | Freq: Four times a day (QID) | ORAL | Status: DC | PRN

## 2017-12-16 MED ORDER — NICOTINE 7 MG/24HR TD PT24
7.0000 mg | MEDICATED_PATCH | Freq: Every day | TRANSDERMAL | Status: DC
  Administered 2017-12-16 – 2017-12-17 (×2): 7 mg via TRANSDERMAL
  Filled 2017-12-16 (×2): qty 1

## 2017-12-16 NOTE — ED Notes (Signed)
Pt had an ablation and a tubal. No need for pregnancy test per EDP

## 2017-12-16 NOTE — Consult Note (Signed)
ANTICOAGULATION CONSULT NOTE - Initial Consult  Pharmacy Consult for PE/DVT Indication: pulmonary embolus  No Known Allergies  Patient Measurements: Weight: 224 lb (101.6 kg) Heparin Dosing Weight: 76.33kg  Vital Signs: Temp: 98.5 F (36.9 C) (01/25 1645) Temp Source: Oral (01/25 1645) BP: 133/81 (01/25 1645) Pulse Rate: 82 (01/25 1645)  Labs: Recent Labs    12/16/17 1644  HGB 13.6  HCT 40.6  PLT 221  CREATININE 0.86  TROPONINI <0.03    CrCl cannot be calculated (Unknown ideal weight.).   Medical History: Past Medical History:  Diagnosis Date  . Anemia   . Anxiety   . Attention deficit disorder (ADD)   . Collagen vascular disease ([facility])    lupus  . Genital herpes 2015  . 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 Mild depression ([place])   . Numbness     Medications:  Scheduled:  . heparin  4,700 Units Intravenous Once    Assessment: Pt is a 47 year old female who presents with chest pain. Found to have bilateral PE. Pharmacy consulted to dose heparin drip. No home anticoagulation seen on med rec or in care everywhere. Baseline CBC WNL. Add on INR and APTT ordered  Goal of Therapy:  Heparin level 0.3-0.7 units/ml Monitor platelets by anticoagulation protocol: Yes   Plan:  Give 4700 units bolus x 1 Start heparin infusion at 1300 units/hr Check anti-Xa level in 6 hours and daily while on heparin Continue to monitor H&H and platelets  Delia Slatten D Semir Brill, Pharm.D, BCPS Clinical Pharmacist  12/16/2017,8:16 PM

## 2017-12-16 NOTE — ED Notes (Signed)
Patient transported to Ultrasound 

## 2017-12-16 NOTE — ED Notes (Signed)
Pt returned from US at this time.

## 2017-12-16 NOTE — H&P (Signed)
[facility] NAME: Melinda Ross    MR#:  [id number]  DATE OF BIRTH:  October 10, 1971  DATE OF ADMISSION:  12/16/2017  PRIMARY CARE PHYSICIAN: Will Bonnet, MD   REQUESTING/REFERRING PHYSICIAN: Dr. Lenise Arena  CHIEF COMPLAINT: Chest pain   Chief Complaint  Patient presents with  . Chest Pain  . Leg Pain    HISTORY OF PRESENT ILLNESS:  Melinda Ross  is a 47 y.o. female with a known history of essential hypertension, lupus noncompliant with medication comes in with pleuritic chest pain since yesterday the chest, also noted to have left leg tightness since yesterday.  No fever or cough.  Felt short of breath.  No hypoxia.  CT chest showed bilateral PE, left leg DVT.  Patient told me she is supposed to take Plaquenil for lupus but she is not regular.  She works at a [employer] and walks all the time at work  place.  PAST MEDICAL HISTORY:   Past Medical History:  Diagnosis Date  . Anemia   . Anxiety   . Attention deficit disorder (ADD)   . Collagen vascular disease (HCC)    lupus  . Genital herpes 2015  . 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 Mild depression ([place])   . Numbness     PAST SURGICAL HISTOIRY:   Past Surgical History:  Procedure Laterality Date  . CESAREAN SECTION  2003   [facility]  . DG TOES*L* Bilateral   . DILATATION & CURETTAGE/HYSTEROSCOPY WITH MYOSURE N/A 12/24/2016   Procedure: DILATATION & CURETTAGE/HYSTEROSCOPY WITH MYOSURE;  Surgeon: Malachy Mood, MD;  Location: [facility] ORS;  Service: Gynecology;  Laterality: N/A;  . DILATION AND CURETTAGE OF UTERUS    . HYSTEROSCOPY WITH NOVASURE N/A 03/25/2017   Procedure: HYSTEROSCOPY WITH NOVASURE;  Surgeon: Malachy Mood, MD;  Location: [facility] OR[facility];  Service: Gynecology;  Laterality: N/A;  . TUBAL LIGATION  2003   [facility]    SOCIAL HISTORY:   Social History   Tobacco Use  . Smoking status: Current Every Day  Smoker    Packs/day: 1.00    Years: 23.00    Pack years: 23.00    Types: Cigarettes  . Smokeless tobacco: Never Used  Substance Use Topics  . Alcohol use: Yes    Comment: social    FAMILY HISTORY:   Family History  Problem Relation Age of Onset  . High blood pressure Brother   . Diabetes Brother   . Kidney disease Brother   . Lung cancer Mother     DRUG ALLERGIES:  No Known Allergies  REVIEW OF SYSTEMS:  CONSTITUTIONAL: No fever, fatigue or weakness.  EYES: No blurred or double vision.  EARS, NOSE, AND THROAT: No tinnitus or ear pain.  RESPIRATORY: No cough, shortness of breath, wheezing or hemoptysis.  CARDIOVASCULAR: Pleuritic chest pain, shortness of breath yesterday GASTROINTESTINAL: No nausea, vomiting, diarrhea or abdominal pain.  GENITOURINARY: No dysuria, hematuria.  ENDOCRINE: No polyuria, nocturia,  HEMATOLOGY: No anemia, easy bruising or bleeding SKIN: No rash or lesion. MUSCULOSKELETAL;left leg pain. NEUROLOGIC: No tingling, numbness, weakness.  PSYCHIATRY: No anxiety or depression.   MEDICATIONS AT HOME:   Prior to Admission medications   Medication Sig Start Date End Date Taking? Authorizing Provider  ammonium lactate (AMLACTIN) 12 % cream Apply 1 g topically 3 (three) times daily as needed for dry skin.    Yes [provider]  cetirizine (ZYRTEC) 10 MG tablet  Take 10 mg by mouth daily as needed for allergies.   Yes [provider]  clobetasol cream (TEMOVATE) 3.55 % Apply 1 application topically 2 (two) times a week.   Yes [provider]  hydroxychloroquine (PLAQUENIL) 200 MG tablet Take 200 mg by mouth 2 (two) times daily.   Yes [provider]  ibuprofen (ADVIL,MOTRIN) 800 MG tablet Take 1 tablet (800 mg total) by mouth every 8 (eight) hours as needed for moderate pain or cramping. 03/25/17  Yes Malachy Mood, MD  HYDROcodone-acetaminophen (NORCO/VICODIN) 5-325 MG tablet Take 1 tablet by mouth every 6 (six) hours  as needed. Patient not taking: Reported on 12/16/2017 03/25/17   Malachy Mood, MD  omeprazole (PRILOSEC) 40 MG capsule Take 1 capsule (40 mg total) by mouth daily. Patient not taking: Reported on 12/16/2017 04/01/17 04/01/18  Gregor Hams, MD      VITAL SIGNS:  Blood pressure (!) 142/94, pulse 72, temperature 98 F (36.7 C), temperature source Oral, resp. rate 18, height (!) 5.3" (0.135 m), weight 106.4 kg (234 lb 9.6 oz), last menstrual period 11/25/2017, SpO2 100 %.  PHYSICAL EXAMINATION:  GENERAL:  47 y.o.-year-old patient lying in the bed with no acute distress.  EYES: Pupils equal, round, reactive to light and accommodation. No scleral icterus. Extraocular muscles intact.  HEENT: Head atraumatic, normocephalic. Oropharynx and nasopharynx clear.  NECK:  Supple, no jugular venous distention. No thyroid enlargement, no tenderness.  LUNGS: Normal breath sounds bilaterally, no wheezing, rales,rhonchi or crepitation. No use of accessory muscles of respiration.  CARDIOVASCULAR: S1, S2 normal. No murmurs, rubs, or gallops.  ABDOMEN: Soft, nontender, nondistended. Bowel sounds present. No organomegaly or mass.  EXTREMITIES: No pedal edema,.  Left calf tenderness present NEUROLOGIC: Cranial nerves II through XII are intact. Muscle strength 5/5 in all extremities. Sensation intact. Gait not checked.  PSYCHIATRIC: The patient is alert and oriented x 3.  SKIN: No obvious rash, lesion, or ulcer.   LABORATORY PANEL:   CBC Recent Labs  Lab 12/16/17 1644  WBC 6.9  HGB 13.6  HCT 40.6  PLT 221   ------------------------------------------------------------------------------------------------------------------  Chemistries  Recent Labs  Lab 12/16/17 1644  NA 138  K 4.2  CL 109  CO2 21*  GLUCOSE 118*  BUN 12  CREATININE 0.86  CALCIUM 8.8*   ------------------------------------------------------------------------------------------------------------------  Cardiac Enzymes Recent  Labs  Lab 12/16/17 1644  TROPONINI <0.03   ------------------------------------------------------------------------------------------------------------------  RADIOLOGY:  Dg Chest 2 View  Result Date: 12/16/2017 CLINICAL DATA:  Chest pain since yesterday, leg pain today, smoker EXAM: CHEST  2 VIEW COMPARISON:  02/03/2008 FINDINGS: Normal heart size, mediastinal contours, and pulmonary vascularity. Increased bronchitic changes. Questionable RIGHT suprahilar nodular density versus summation artifact. Remaining lungs clear. No pleural effusion or pneumothorax. Scattered mild endplate spur formation thoracic spine. IMPRESSION: Bronchitic changes with questionable RIGHT upper lobe nodular density; CT chest recommended to exclude pulmonary nodule. Electronically Signed   By: Lavonia Dana M.D.   On: 12/16/2017 17:19   Ct Angio Chest Pe W And/or Wo Contrast  Result Date: 12/16/2017 CLINICAL DATA:  Chest and leg pain EXAM: CT ANGIOGRAPHY CHEST WITH CONTRAST TECHNIQUE: Multidetector CT imaging of the chest was performed using the standard protocol during bolus administration of intravenous contrast. Multiplanar CT image reconstructions and MIPs were obtained to evaluate the vascular anatomy. CONTRAST:  57mL ISOVUE-370 IOPAMIDOL (ISOVUE-370) INJECTION 76% COMPARISON:  Radiograph 12/16/2017 FINDINGS: Cardiovascular: Satisfactory opacification of the pulmonary arteries to the segmental level. Multiple small filling defects within segmental and subsegmental  right and left upper lobe arteries with additional small nonocclusive emboli within subsegmental bilateral lower lobe pulmonary arteries. Nonaneurysmal aorta. Borderline cardiomegaly. No pericardial effusion. Mediastinum/Nodes: Midline trachea. 15 mm hypodense nodule left lobe of thyroid. No significant adenopathy. Water density 2.5 cm oval lesion adjacent to the distal esophagus, series 4, image number 48. Esophagus otherwise unremarkable Lungs/Pleura: No  parenchymal abnormality to correspond to the right upper abnormality question on radiograph. No consolidation or effusion. Negative for a pneumothorax. Upper Abdomen: No acute abnormality. Musculoskeletal: No chest wall abnormality. No acute or significant osseous findings. Review of the MIP images confirms the above findings. IMPRESSION: 1. Positive for small bilateral segmental and subsegmental pulmonary emboli involving the upper and lower lobes. 2. No focal pulmonary airspace disease is visualized 3. 15 mm left lobe of thyroid hypodense nodule, nonemergent thyroid ultrasound evaluation as indicated 4. 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 Critical Value/emergent results were called by telephone at the time of interpretation on 12/16/2017 at 8:15 pm to Dr. Lenise Arena , who verbally acknowledged these results. Electronically Signed   By: Donavan Foil M.D.   On: 12/16/2017 20:15   US Venous Img Lower Unilateral Left  Result Date: 12/16/2017 CLINICAL DATA:  Left lower extremity pain and swelling for 1 day. EXAM: LEFT LOWER EXTREMITY VENOUS DOPPLER ULTRASOUND TECHNIQUE: Gray-scale sonography with graded compression, as well as color Doppler and duplex ultrasound were performed to evaluate the lower extremity deep venous systems from the level of the common femoral vein and including the common femoral, femoral, profunda femoral, popliteal and calf veins including the posterior tibial, peroneal and gastrocnemius veins when visible. The superficial great saphenous vein was also interrogated. Spectral Doppler was utilized to evaluate flow at rest and with distal augmentation maneuvers in the common femoral, femoral and popliteal veins. COMPARISON:  None. FINDINGS: Contralateral Common Femoral Vein: Respiratory phasicity is normal and symmetric with the symptomatic side. No evidence of thrombus. Normal compressibility. Common Femoral Vein: No evidence of  thrombus. Normal compressibility, respiratory phasicity and response to augmentation. Saphenofemoral Junction: No evidence of thrombus. Normal compressibility and flow on color Doppler imaging. Profunda Femoral Vein: No evidence of thrombus. Normal compressibility and flow on color Doppler imaging. Femoral Vein: No evidence of thrombus. Normal compressibility, respiratory phasicity and response to augmentation. Popliteal Vein: No evidence of thrombus. Normal compressibility, respiratory phasicity and response to augmentation. Calf Veins: Occlusive thrombus is seen the peroneal veins. Superficial Great Saphenous Vein: No evidence of thrombus. Normal compressibility. Venous Reflux:  None. Other Findings:  None. IMPRESSION: Occlusive thrombus in the peroneal veins of the calf. The examination is otherwise negative. Electronically Signed   By: Inge Rise M.D.   On: 12/16/2017 18:36    EKG:   Orders placed or performed during the hospital encounter of 12/16/17  . EKG 12-Lead  . EKG 12-Lead  . ED EKG within 10 minutes  . ED EKG within 10 minutes    IMPRESSION AND PLAN:  Chest pain secondary to bilateral PE, left leg DVT: Patient not hypoxic, hemodynamically stable .  Continue heparin drip for 24 hours, then start novel anticoagulants at discharge, and send hypercoagulability workup. 2.  Lupus: Continue Plaquenil. 3.  History of tobacco abuse: Counseled to quit, has faint wheezing continue nebulizers.   All the records are reviewed and case discussed with ED provider. Management plans discussed with the patient, family and they are in agreement.  CODE STATUS: full  TOTAL TIME TAKING CARE OF  THIS PATIENT: 55 minutes.    Epifanio Lesches M.D on 12/16/2017 at 11:07 PM  Between 7am to 6pm - Pager - [phone]  After 6pm go to [email or web address] - password [email or web address] [facility]  Office  [phone]  CC: Primary care physician; Will Bonnet, MD  Note: This  dictation was prepared with Dragon dictation along with smaller phrase technology. Any transcriptional errors that result from this process are unintentional.

## 2017-12-16 NOTE — ED Triage Notes (Signed)
Pt to ED via POV, c/o chest pain that started yesterday and leg pain that started today. Pt denies radiation of chest pain, shortness of breath, N/V. Pt denies any injury to leg. Pt denies numbness or weakness in leg. Pt states that her friend told her that her leg was swollen and red. Pt in NAD at this time.

## 2017-12-16 NOTE — ED Notes (Signed)
Patient transported to CT 

## 2017-12-16 NOTE — ED Provider Notes (Signed)
[facility] Emergency Department Provider Note       Time seen: ----------------------------------------- 5:33 PM on 12/16/2017 -----------------------------------------   I have reviewed the triage vital signs and the nursing notes.  HISTORY   Chief Complaint Chest Pain and Leg Pain    HPI Melinda Ross is a 47 y.o. female with a history of anemia, anxiety, lupus and depression who presents to the ED for breath, nausea or vomiting.  She also denies any injury to the leg.  She states is not hurting unless she tries to stand on it.  Left leg was red and swollen.  She denies fevers, chills or other complaints.  Past Medical History:  Diagnosis Date  . Anemia   . Anxiety   . Attention deficit disorder (ADD)   . Genital herpes 2015  . 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 Mild depression ([place])   . Numbness     Patient Active Problem List   Diagnosis Date Noted  . 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 Numbness     Past Surgical History:  Procedure Laterality Date  . CESAREAN SECTION  2003   [facility]  . DG TOES*L* Bilateral   . DILATATION & CURETTAGE/HYSTEROSCOPY WITH MYOSURE N/A 12/24/2016   Procedure: DILATATION & CURETTAGE/HYSTEROSCOPY WITH MYOSURE;  Surgeon: Malachy Mood, MD;  Location: [facility] ORS;  Service: Gynecology;  Laterality: N/A;  . DILATION AND CURETTAGE OF UTERUS    . HYSTEROSCOPY WITH NOVASURE N/A 03/25/2017   Procedure: HYSTEROSCOPY WITH NOVASURE;  Surgeon: Malachy Mood, MD;  Location: [facility] OR[facility];  Service: Gynecology;  Laterality: N/A;  . TUBAL LIGATION  2003   [facility]    Allergies Patient has no known allergies.  Social History Social History   Tobacco Use  . Smoking status: Current Every Day Smoker    Packs/day: 1.00    Years: 23.00    Pack years: 23.00    Types: Cigarettes  . Smokeless tobacco: Never Used  Substance Use Topics  . Alcohol use: Yes    Comment: social  . Drug use: No    Review  of Systems Constitutional: Negative for fever. Cardiovascular: Positive for chest pain Respiratory: Negative for shortness of breath. Gastrointestinal: Negative for abdominal pain, vomiting and diarrhea. Genitourinary: Negative for dysuria. Musculoskeletal: Positive for left leg pain Skin: Negative for rash. Neurological: Negative for headaches, focal weakness or numbness.  All systems negative/normal/unremarkable except as stated in the HPI  ____________________________________________   PHYSICAL EXAM:  VITAL SIGNS: ED Triage Vitals  Enc Vitals Group     BP 12/16/17 1645 133/81     Pulse Rate 12/16/17 1645 82     Resp 12/16/17 1645 16     Temp 12/16/17 1645 98.5 F (36.9 C)     Temp Source 12/16/17 1645 Oral     SpO2 12/16/17 1645 98 %     Weight 12/16/17 1646 224 lb (101.6 kg)     Height --      Head Circumference --      Peak Flow --      Pain Score 12/16/17 1646 8     Pain Loc --      Pain Edu? --      Excl. in [place]? --     Constitutional: Alert and oriented. Well appearing and in no distress. Eyes: Conjunctivae are normal. Normal extraocular movements. ENT   Head: Normocephalic and atraumatic.   Nose: No congestion/rhinnorhea.   Mouth/Throat: Mucous membranes are moist.  Neck: No stridor. Cardiovascular: Normal rate, regular rhythm. No murmurs, rubs, or gallops. Respiratory: Normal respiratory effort without tachypnea nor retractions. Breath sounds are clear and equal bilaterally. No wheezes/rales/rhonchi. Gastrointestinal: Soft and nontender. Normal bowel sounds Musculoskeletal: Nontender with normal range of motion in extremities. No lower extremity tenderness nor edema. Neurologic:  Normal speech and language. No gross focal neurologic deficits are appreciated.  Skin:  Skin is warm, dry and intact. No rash noted. Psychiatric: Mood and affect are normal. Speech and behavior are normal.  ____________________________________________  EKG:  Interpreted by me.  Sinus rhythm the rate 87 bpm, normal PR interval, normal QRS, normal QT.  ____________________________________________  ED COURSE:  As part of my medical decision making, I reviewed the following data within the [facility] History obtained from family if available, nursing notes, old chart and ekg, as well as notes from prior ED visits. Patient presented for chest pain and left leg pain, we will assess with labs and imaging as indicated at this time.   Procedures ____________________________________________   LABS (pertinent positives/negatives)  Labs Reviewed  BASIC METABOLIC PANEL - Abnormal; Notable for the following components:      Result Value   CO2 21 (*)    Glucose, Bld 118 (*)    Calcium 8.8 (*)    All other components within normal limits  CBC - Abnormal; Notable for the following components:   RDW 15.7 (*)    All other components within normal limits  FIBRIN DERIVATIVES D-DIMER ([facility] ONLY) - Abnormal; Notable for the following components:   Fibrin derivatives D-dimer [facility]) 2,054.91 (*)    All other components within normal limits  TROPONIN I  PROTIME-INR  APTT   CRITICAL CARE Performed by: Earleen Newport   Total critical care time: 30 minutes  Critical care time was exclusive of separately billable procedures and treating other patients.  Critical care was necessary to treat or prevent imminent or life-threatening deterioration.  Critical care was time spent personally by me on the following activities: development of treatment plan with patient and/or surrogate as well as nursing, discussions with consultants, evaluation of patient's response to treatment, examination of patient, obtaining history from patient or surrogate, ordering and performing treatments and interventions, ordering and review of laboratory studies, ordering and review of radiographic studies, pulse oximetry and re-evaluation of patient's  condition.  RADIOLOGY Images were viewed by me  IMPRESSION: Bronchitic changes with questionable RIGHT upper lobe nodular density; CT chest recommended to exclude pulmonary nodule. IMPRESSION: Occlusive thrombus in the peroneal veins of the calf. The examination is otherwise negative. CT angiogram of the chest reveals bilateral PEs ____________________________________________  DIFFERENTIAL DIAGNOSIS   Unstable angina, PE, pneumothorax, dissection, musculoskeletal pain, GERD, muscle strain, DVT  FINAL ASSESSMENT AND PLAN  Chest pain, left leg pain, bilateral pulmonary emboli   Plan: Patient had presented for chest and leg pain. Patient's labs revealed elevated d-dimer but otherwise stable labs. Patient's imaging was concerning because initially we found a thrombus in the peroneal veins of the calf.  She was also having chest pain so I subsequently ordered a CT angiogram which revealed bilateral PEs.  We placed her on a heparin drip and I will discussed with the hospitalist for admission.  Currently her vital signs are stable.   Earleen Newport, MD   Note: This note was generated in part or whole with voice recognition software. Voice recognition is usually quite accurate but there are transcription errors that can and very often  do occur. I apologize for any typographical errors that were not detected and corrected.     Earleen Newport, MD 12/16/17 2019

## 2017-12-17 LAB — CBC
HCT: 36.6 % (ref 35.0–47.0)
Hemoglobin: 12.2 g/dL (ref 12.0–16.0)
MCH: 31.2 pg (ref 26.0–34.0)
MCHC: 33.3 g/dL (ref 32.0–36.0)
MCV: 93.7 fL (ref 80.0–100.0)
PLATELETS: 238 10*3/uL (ref 150–440)
RBC: 3.91 MIL/uL (ref 3.80–5.20)
RDW: 16.2 % — AB (ref 11.5–14.5)
WBC: 4.4 10*3/uL (ref 3.6–11.0)

## 2017-12-17 LAB — BASIC METABOLIC PANEL
Anion gap: 7 (ref 5–15)
BUN: 13 mg/dL (ref 6–20)
CHLORIDE: 108 mmol/L (ref 101–111)
CO2: 23 mmol/L (ref 22–32)
CREATININE: 0.85 mg/dL (ref 0.44–1.00)
Calcium: 8.7 mg/dL — ABNORMAL LOW (ref 8.9–10.3)
GFR calc Af Amer: >60 mL/min (ref >60)
GFR calc non Af Amer: >60 mL/min (ref >60)
Glucose, Bld: 113 mg/dL — ABNORMAL HIGH (ref 65–99)
Potassium: 3.2 mmol/L — ABNORMAL LOW (ref 3.5–5.1)
SODIUM: 138 mmol/L (ref 135–145)

## 2017-12-17 LAB — HEPARIN LEVEL (UNFRACTIONATED): Heparin Unfractionated: 0.45 IU/mL (ref 0.30–0.70)

## 2017-12-17 LAB — GLUCOSE, CAPILLARY: Glucose-Capillary: 111 mg/dL — ABNORMAL HIGH (ref 65–99)

## 2017-12-17 MED ORDER — IPRATROPIUM-ALBUTEROL 0.5-2.5 (3) MG/3ML IN SOLN: 3.0000 mL | Freq: Three times a day (TID) | RESPIRATORY_TRACT | Status: DC

## 2017-12-17 MED ORDER — APIXABAN 5 MG PO TABS: 5.0000 mg | ORAL_TABLET | Freq: Two times a day (BID) | ORAL | 3 refills | Status: DC

## 2017-12-17 MED ORDER — APIXABAN 5 MG PO TABS
10.0000 mg | ORAL_TABLET | Freq: Two times a day (BID) | ORAL | Status: DC
  Administered 2017-12-17: 10 mg via ORAL
  Filled 2017-12-17: qty 2

## 2017-12-17 MED ORDER — APIXABAN 5 MG PO TABS: 10.0000 mg | ORAL_TABLET | Freq: Two times a day (BID) | ORAL | 0 refills | Status: DC

## 2017-12-17 MED ORDER — POTASSIUM CHLORIDE CRYS ER 20 MEQ PO TBCR
40.0000 meq | EXTENDED_RELEASE_TABLET | Freq: Once | ORAL | Status: AC
  Administered 2017-12-17: 40 meq via ORAL
  Filled 2017-12-17: qty 2

## 2017-12-17 MED ORDER — NICOTINE 14 MG/24HR TD PT24: 14.0000 mg | MEDICATED_PATCH | Freq: Every day | TRANSDERMAL | 0 refills | Status: DC

## 2017-12-17 MED ORDER — APIXABAN (ELIQUIS) EDUCATION KIT FOR DVT/PE PATIENTS: PACK | 0 refills | Status: DC

## 2017-12-17 MED ORDER — TRAMADOL HCL 50 MG PO TABS: 50.0000 mg | ORAL_TABLET | Freq: Four times a day (QID) | ORAL | 0 refills | Status: DC | PRN

## 2017-12-17 MED ORDER — APIXABAN 5 MG PO TABS: 5.0000 mg | ORAL_TABLET | Freq: Two times a day (BID) | ORAL | Status: DC

## 2017-12-17 NOTE — Plan of Care (Signed)
  Progressing Education: Knowledge of General Education information will improve 12/17/2017 0021 - Progressing by Tad Moore, RN Health Behavior/Discharge Planning: Ability to manage health-related needs will improve 12/17/2017 0021 - Progressing by Tad Moore, RN Clinical Measurements: Ability to maintain clinical measurements within normal limits will improve 12/17/2017 0021 - Progressing by Tad Moore, RN Will remain free from infection 12/17/2017 0021 - Progressing by Tad Moore, RN Diagnostic test results will improve 12/17/2017 0021 - Progressing by Tad Moore, RN Respiratory complications will improve 12/17/2017 0021 - Progressing by Tad Moore, RN Cardiovascular complication will be avoided 12/17/2017 0021 - Progressing by Tad Moore, RN Activity: Risk for activity intolerance will decrease 12/17/2017 0021 - Progressing by Tad Moore, RN Nutrition: Adequate nutrition will be maintained 12/17/2017 0021 - Progressing by Tad Moore, RN Coping: Level of anxiety will decrease 12/17/2017 0021 - Progressing by Tad Moore, RN Elimination: Will not experience complications related to bowel motility 12/17/2017 0021 - Progressing by Tad Moore, RN Will not experience complications related to urinary retention 12/17/2017 0021 - Progressing by Tad Moore, RN Pain Managment: General experience of comfort will improve 12/17/2017 0021 - Progressing by Tad Moore, RN Safety: Ability to remain free from injury will improve 12/17/2017 0021 - Progressing by Tad Moore, RN Skin Integrity: Risk for impaired skin integrity will decrease 12/17/2017 0021 - Progressing by Tad Moore, RN

## 2017-12-17 NOTE — Progress Notes (Signed)
     Melinda Ross was admitted to the [facility] on 12/16/2017 for an acute medical condition and is being Discharged on  12/17/2017 .  She will need another week for recovery and so advised to stay away from work until then. So please excuse  her from work for the above  Days. Should be able to return to work from 12/26/17.  Call Gladstone Lighter  MD, [facility] at  [phone] with questions.  Gladstone Lighter M.D on 12/17/2017,at 9:06 AM  [facility] [address] [place] [address]

## 2017-12-17 NOTE — Discharge Summary (Addendum)
[facility] NAME: Melinda Ross    MR#:  [id number]  DATE OF BIRTH:  03-26-1971  DATE OF ADMISSION:  12/16/2017   ADMITTING PHYSICIAN: Epifanio Lesches, MD  DATE OF DISCHARGE: 12/17/2017  PRIMARY CARE PHYSICIAN: Will Bonnet, MD   ADMISSION DIAGNOSIS:   Pulmonary embolism, bilateral ([place]) [I26.99]  DISCHARGE DIAGNOSIS:   Active Problems:   Pulmonary emboli (HCC)   SECONDARY DIAGNOSIS:   Past Medical History:  Diagnosis Date  . Anemia   . Anxiety   . Attention deficit disorder (ADD)   . Collagen vascular disease (HCC)    lupus  . Genital herpes 2015  . 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 Mild depression ([place])   . Numbness     HOSPITAL COURSE:   47 year old female with past medical history significant for lupus, anxiety, anemia, hypertension presents to hospital secondary to left leg pain and chest pain  1. Left leg popliteal DVT with bilateral segmental and subsegmental pulmonary emboli-hypercoagulability workup has been sent for. -Vitals are stable, no tachycardia or hypoxia. -Some swelling of left leg noted. Started on IV heparin drip in the hospital -Being discharged on eliquis. -Advised to establish an outpatient primary care doctor.  2. 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 On Plaquenil. Continue follow up with rheumatology  3. Hypokalemia-replaced prior to discharge  4. Tobacco use-received nicotine patch in the hospital. Counseled against smoking  Encourage ambulation, advised to stay away from work and exertional activity for a week.  DISCHARGE CONDITIONS:   Guarded CONSULTS OBTAINED:    none  DRUG ALLERGIES:   No Known Allergies DISCHARGE MEDICATIONS:   Allergies as of 12/17/2017   No Known Allergies     Medication List    STOP taking these medications   HYDROcodone-acetaminophen 5-325 MG tablet Commonly known as:  NORCO/VICODIN   ibuprofen  800 MG tablet Commonly known as:  ADVIL,MOTRIN   omeprazole 40 MG capsule Commonly known as:  PRILOSEC     TAKE these medications   ammonium lactate 12 % cream Commonly known as:  AMLACTIN Apply 1 g topically 3 (three) times daily as needed for dry skin.   apixaban 5 MG Tabs tablet Commonly known as:  ELIQUIS Take 2 tablets (10 mg total) by mouth 2 (two) times daily for 7 days.   apixaban 5 MG Tabs tablet Commonly known as:  ELIQUIS Take 1 tablet (5 mg total) by mouth 2 (two) times daily. Start taking on:  12/24/2017   apixaban Kit Commonly known as:  ELIQUIS Take 9m PO bid for 1 week followed by 55mPO BID   cetirizine 10 MG tablet Commonly known as:  ZYRTEC Take 10 mg by mouth daily as needed for allergies.   clobetasol cream 0.05 % Commonly known as:  TEMOVATE Apply 1 application topically 2 (two) times a week.   hydroxychloroquine 200 MG tablet Commonly known as:  PLAQUENIL Take 200 mg by mouth 2 (two) times daily.   nicotine 14 mg/24hr patch Commonly known as:  NICODERM CQ - dosed in mg/24 hours Place 1 patch (14 mg total) onto the skin daily. Start taking on:  12/18/2017   traMADol 50 MG tablet Commonly known as:  ULTRAM Take 1 tablet (50 mg total) by mouth every 6 (six) hours as needed.        DISCHARGE INSTRUCTIONS:   1. PCP follow-up in 1-2 weeks  DIET:  Cardiac diet  ACTIVITY:   Activity as tolerated  OXYGEN:   Home Oxygen: No.  Oxygen Delivery: room air  DISCHARGE LOCATION:   home   If you experience worsening of your admission symptoms, develop shortness of breath, life threatening emergency, suicidal or homicidal thoughts you must seek medical attention immediately by calling 911 or calling your MD immediately  if symptoms less severe.  You Must read complete instructions/literature along with all the possible adverse reactions/side effects for all the Medicines you take and that have been prescribed to you. Take any new Medicines  after you have completely understood and accpet all the possible adverse reactions/side effects.   Please note  You were cared for by a hospitalist during your hospital stay. If you have any questions about your discharge medications or the care you received while you were in the hospital after you are discharged, you can call the unit and asked to speak with the hospitalist on call if the hospitalist that took care of you is not available. Once you are discharged, your primary care physician will handle any further medical issues. Please note that NO REFILLS for any discharge medications will be authorized once you are discharged, as it is imperative that you return to your primary care physician (or establish a relationship with a primary care physician if you do not have one) for your aftercare needs so that they can reassess your need for medications and monitor your lab values.    On the day of Discharge:  VITAL SIGNS:   Blood pressure 96/73, pulse 80, temperature (!) 97.3 F (36.3 C), temperature source Oral, resp. rate 18, height '5\' 3"'  (1.6 m), weight 106.1 kg (234 lb), last menstrual period 11/25/2017, SpO2 100 %.  PHYSICAL EXAMINATION:    GENERAL:  47 y.o.-year-old patient lying in the bed with no acute distress.  EYES: Pupils equal, round, reactive to light and accommodation. No scleral icterus. Extraocular muscles intact.  HEENT: Head atraumatic, normocephalic. Oropharynx and nasopharynx clear.  NECK:  Supple, no jugular venous distention. No thyroid enlargement, no tenderness.  LUNGS: Normal breath sounds bilaterally, no wheezing, rales,rhonchi or crepitation. No use of accessory muscles of respiration. Decreased bibasilar breath sounds CARDIOVASCULAR: S1, S2 normal. No murmurs, rubs, or gallops.  ABDOMEN: Soft, non-tender, non-distended. Bowel sounds present. No organomegaly or mass.  EXTREMITIES: No pedal edema, cyanosis, or clubbing. Left calf is slightly swollen than the right  one with positive Homans sign NEUROLOGIC: Cranial nerves II through XII are intact. Muscle strength 5/5 in all extremities. Sensation intact. Gait not checked.  PSYCHIATRIC: The patient is alert and oriented x 3.  SKIN: No obvious rash, lesion, or ulcer.   DATA REVIEW:   CBC Recent Labs  Lab 12/17/17 0322  WBC 4.4  HGB 12.2  HCT 36.6  PLT 238    Chemistries  Recent Labs  Lab 12/17/17 0322  NA 138  K 3.2*  CL 108  CO2 23  GLUCOSE 113*  BUN 13  CREATININE 0.85  CALCIUM 8.7*     Microbiology Results  No results found for this or any previous visit.  RADIOLOGY:  Dg Chest 2 View  Result Date: 12/16/2017 CLINICAL DATA:  Chest pain since yesterday, leg pain today, smoker EXAM: CHEST  2 VIEW COMPARISON:  02/03/2008 FINDINGS: Normal heart size, mediastinal contours, and pulmonary vascularity. Increased bronchitic changes. Questionable RIGHT suprahilar nodular density versus summation artifact. Remaining lungs clear. No pleural effusion or pneumothorax. Scattered mild endplate spur formation thoracic spine. IMPRESSION:  Bronchitic changes with questionable RIGHT upper lobe nodular density; CT chest recommended to exclude pulmonary nodule. Electronically Signed   By: Lavonia Dana M.D.   On: 12/16/2017 17:19   Ct Angio Chest Pe W And/or Wo Contrast  Result Date: 12/16/2017 CLINICAL DATA:  Chest and leg pain EXAM: CT ANGIOGRAPHY CHEST WITH CONTRAST TECHNIQUE: Multidetector CT imaging of the chest was performed using the standard protocol during bolus administration of intravenous contrast. Multiplanar CT image reconstructions and MIPs were obtained to evaluate the vascular anatomy. CONTRAST:  61m ISOVUE-370 IOPAMIDOL (ISOVUE-370) INJECTION 76% COMPARISON:  Radiograph 12/16/2017 FINDINGS: Cardiovascular: Satisfactory opacification of the pulmonary arteries to the segmental level. Multiple small filling defects within segmental and subsegmental right and left upper lobe arteries with  additional small nonocclusive emboli within subsegmental bilateral lower lobe pulmonary arteries. Nonaneurysmal aorta. Borderline cardiomegaly. No pericardial effusion. Mediastinum/Nodes: Midline trachea. 15 mm hypodense nodule left lobe of thyroid. No significant adenopathy. Water density 2.5 cm oval lesion adjacent to the distal esophagus, series 4, image number 48. Esophagus otherwise unremarkable Lungs/Pleura: No parenchymal abnormality to correspond to the right upper abnormality question on radiograph. No consolidation or effusion. Negative for a pneumothorax. Upper Abdomen: No acute abnormality. Musculoskeletal: No chest wall abnormality. No acute or significant osseous findings. Review of the MIP images confirms the above findings. IMPRESSION: 1. Positive for small bilateral segmental and subsegmental pulmonary emboli involving the upper and lower lobes. 2. No focal pulmonary airspace disease is visualized 3. 15 mm left lobe of thyroid hypodense nodule, nonemergent thyroid ultrasound evaluation as indicated 4. 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 Critical Value/emergent results were called by telephone at the time of interpretation on 12/16/2017 at 8:15 pm to Dr. JLenise Arena, who verbally acknowledged these results. Electronically Signed   By: KDonavan FoilM.D.   On: 12/16/2017 20:15   UKoreaVenous Img Lower Unilateral Left  Result Date: 12/16/2017 CLINICAL DATA:  Left lower extremity pain and swelling for 1 day. EXAM: LEFT LOWER EXTREMITY VENOUS DOPPLER ULTRASOUND TECHNIQUE: Gray-scale sonography with graded compression, as well as color Doppler and duplex ultrasound were performed to evaluate the lower extremity deep venous systems from the level of the common femoral vein and including the common femoral, femoral, profunda femoral, popliteal and calf veins including the posterior tibial, peroneal and gastrocnemius veins when visible. The  superficial great saphenous vein was also interrogated. Spectral Doppler was utilized to evaluate flow at rest and with distal augmentation maneuvers in the common femoral, femoral and popliteal veins. COMPARISON:  None. FINDINGS: Contralateral Common Femoral Vein: Respiratory phasicity is normal and symmetric with the symptomatic side. No evidence of thrombus. Normal compressibility. Common Femoral Vein: No evidence of thrombus. Normal compressibility, respiratory phasicity and response to augmentation. Saphenofemoral Junction: No evidence of thrombus. Normal compressibility and flow on color Doppler imaging. Profunda Femoral Vein: No evidence of thrombus. Normal compressibility and flow on color Doppler imaging. Femoral Vein: No evidence of thrombus. Normal compressibility, respiratory phasicity and response to augmentation. Popliteal Vein: No evidence of thrombus. Normal compressibility, respiratory phasicity and response to augmentation. Calf Veins: Occlusive thrombus is seen the peroneal veins. Superficial Great Saphenous Vein: No evidence of thrombus. Normal compressibility. Venous Reflux:  None. Other Findings:  None. IMPRESSION: Occlusive thrombus in the peroneal veins of the calf. The examination is otherwise negative. Electronically Signed   By: TInge RiseM.D.   On: 12/16/2017 18:36     Management plans discussed with the patient,  family and they are in agreement.  CODE STATUS:     Code Status Orders  (From admission, onward)        Start     Ordered   12/16/17 2026  Full code  Continuous     12/16/17 2027    Code Status History    Date Active Date Inactive Code Status Order ID Comments User Context   This patient has a current code status but no historical code status.      TOTAL TIME TAKING CARE OF THIS PATIENT: 38 minutes.    Gladstone Lighter M.D on 12/17/2017 at 8:56 AM  Between 7am to 6pm - Pager - [phone]  After 6pm go to [email or web address] - [facility]  [facility]  Office  [phone]  CC: Primary care physician; Will Bonnet, MD   Note: This dictation was prepared with Dragon dictation along with smaller phrase technology. Any transcriptional errors that result from this process are unintentional.

## 2017-12-17 NOTE — Progress Notes (Signed)
The patient is admitted to room 233 with the diagnosis of PE. Alert and oriented x 4. Patient has been oriented to her room, call bell/ascom. Patient was medicated with Hydrocodone 1 tab for c/o left calf pain of 8/10 on a scale. Heparin drip is runniong at 71mL/hr, no bleeding/bruising/hematoma noted. Will continue to monitor.

## 2017-12-17 NOTE — Consult Note (Signed)
ANTICOAGULATION CONSULT NOTE - Initial Consult  Pharmacy Consult for PE/DVT Indication: pulmonary embolus  No Known Allergies  Patient Measurements: Height: (!) 5.3" (13.5 cm) Weight: 234 lb 9.6 oz (106.4 kg) IBW/kg (Calculated) : -80.31 Heparin Dosing Weight: 76.33kg  Vital Signs: Temp: 98.3 F (36.8 C) (01/26 0423) Temp Source: Oral (01/26 0423) BP: 110/67 (01/26 0423) Pulse Rate: 87 (01/26 0423)  Labs: Recent Labs    12/16/17 1644 12/16/17 1919 12/17/17 0322  HGB 13.6  --  12.2  HCT 40.6  --  36.6  PLT 221  --  238  APTT  --  30  --   LABPROT  --  13.4  --   INR  --  1.03  --   HEPARINUNFRC  --   --  0.45  CREATININE 0.86  --  0.85  TROPONINI <0.03  --   --     CrCl cannot be calculated (Unknown ideal weight.).   Medical History: Past Medical History:  Diagnosis Date  . Anemia   . Anxiety   . Attention deficit disorder (ADD)   . Collagen vascular disease (HCC)    lupus  . Genital herpes 2015  . 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 Mild depression ([place])   . Numbness     Medications:  Scheduled:  . docusate sodium  100 mg Oral BID  . hydroxychloroquine  200 mg Oral BID  . ipratropium-albuterol  3 mL Nebulization Q6H  . loratadine  10 mg Oral Daily  . nicotine  7 mg Transdermal Daily  . pantoprazole  40 mg Oral Daily    Assessment: Pt is a 47 year old female who presents with chest pain. Found to have bilateral PE. Pharmacy consulted to dose heparin drip. No home anticoagulation seen on med rec or in care everywhere. Baseline CBC WNL. Add on INR and APTT ordered  Goal of Therapy:  Heparin level 0.3-0.7 units/ml Monitor platelets by anticoagulation protocol: Yes   Plan:  Give 4700 units bolus x 1 Start heparin infusion at 1300 units/hr Check anti-Xa level in 6 hours and daily while on heparin Continue to monitor H&H and platelets  1/16 0330 heparin level 0.45. Continue current regimen. Recheck in 6 hours to confirm.  Sim Boast,  PharmD, BCPS  12/17/17 4:26 AM

## 2017-12-17 NOTE — Consult Note (Signed)
ANTICOAGULATION CONSULT NOTE - Initial Consult  Pharmacy Consult for apixaban Indication: pulmonary embolus and DVT  No Known Allergies  Patient Measurements: Height: 5\' 3"  (160 cm) Weight: 234 lb (106.1 kg) IBW/kg (Calculated) : 52.4 Heparin Dosing Weight:   Vital Signs: Temp: 97.3 F (36.3 C) (01/26 0802) Temp Source: Oral (01/26 0802) BP: 96/73 (01/26 0802) Pulse Rate: 80 (01/26 0802)  Labs: Recent Labs    12/16/17 1644 12/16/17 1919 12/17/17 0322  HGB 13.6  --  12.2  HCT 40.6  --  36.6  PLT 221  --  238  APTT  --  30  --   LABPROT  --  13.4  --   INR  --  1.03  --   HEPARINUNFRC  --   --  0.45  CREATININE 0.86  --  0.85  TROPONINI <0.03  --   --     Estimated Creatinine Clearance: 98.5 mL/min (by C-G formula based on SCr of 0.85 mg/dL).   Medical History: Past Medical History:  Diagnosis Date  . Anemia   . Anxiety   . Attention deficit disorder (ADD)   . Collagen vascular disease (HCC)    lupus  . Genital herpes 2015  . 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 Mild depression ([place])   . Numbness     Medications:  Scheduled:  . apixaban  10 mg Oral BID   Followed by  . [START ON 12/24/2017] apixaban  5 mg Oral BID  . docusate sodium  100 mg Oral BID  . hydroxychloroquine  200 mg Oral BID  . ipratropium-albuterol  3 mL Nebulization Q6H  . loratadine  10 mg Oral Daily  . nicotine  7 mg Transdermal Daily  . pantoprazole  40 mg Oral Daily  . potassium chloride  40 mEq Oral Once    Assessment: Pt is a 47 year old female who presents with chest pain. Found to have bilateral PE. Patient was started on a heparin drip last night. Now being transitioned to apixaban  Goal of Therapy:   Monitor platelets by anticoagulation protocol: Yes   Plan:  apixaban 10mg  BID x 7 days then 5mg  BID  Lillybeth Tal D Rodarius Kichline, Pharm.D, BCPS Clinical Pharmacist  12/17/2017,8:08 AM

## 2017-12-17 NOTE — Care Management Note (Signed)
Case Management Note  Patient Details  Name: Melinda Ross MRN: [id number] Date of Birth: 1971-10-20  Subjective/Objective:    Discharge today with no home health orders. Provided an Eliquis coupon.                 Action/Plan:   Expected Discharge Date:  12/17/17               Expected Discharge Plan:     In-House Referral:     Discharge planning Services     Post Acute Care Choice:    Choice offered to:     DME Arranged:    DME Agency:     HH Arranged:    HH Agency:     Status of Service:     If discussed at [employer] [facility] [employer], dates discussed:    Additional Comments:  Breyona Swander A, RN 12/17/2017, 9:21 AM

## 2017-12-18 LAB — HIV ANTIBODY (ROUTINE TESTING W REFLEX): HIV SCREEN 4TH GENERATION: NONREACTIVE

## 2017-12-19 LAB — PROTEIN S PANEL
PROTEIN S AG TOTAL: 73 % (ref 60–150)
Protein S Activity: 89 % (ref 63–140)
Protein S Ag, Free: 113 % (ref 57–157)

## 2017-12-19 LAB — PROTEIN C ACTIVITY: Protein C Activity: 115 % (ref 73–180)

## 2017-12-22 LAB — FACTOR 5 LEIDEN

## 2018-01-09 ENCOUNTER — Ambulatory Visit (INDEPENDENT_AMBULATORY_CARE_PROVIDER_SITE_OTHER): Payer: BLUE CROSS/BLUE SHIELD | Admitting: Vascular Surgery

## 2018-01-09 ENCOUNTER — Encounter (INDEPENDENT_AMBULATORY_CARE_PROVIDER_SITE_OTHER): Payer: Self-pay | Admitting: Vascular Surgery

## 2018-01-09 VITALS — BP 130/80 | HR 87 | Resp 16 | Ht 63.0 in | Wt 238.0 lb

## 2018-01-09 DIAGNOSIS — L930 Discoid lupus erythematosus: Secondary | ICD-10-CM

## 2018-01-09 DIAGNOSIS — I2699 Other pulmonary embolism without acute cor pulmonale: Secondary | ICD-10-CM

## 2018-01-09 DIAGNOSIS — I82409 Acute embolism and thrombosis of unspecified deep veins of unspecified lower extremity: Secondary | ICD-10-CM

## 2018-01-09 NOTE — Progress Notes (Signed)
MRN : [id number]  Melinda Ross is a 47 y.o. ([birth year]) female who presents with chief complaint of  Chief Complaint  Patient presents with  . New Patient (Initial Visit)    Left leg DVT and bilateral PE  .  History of Present Illness: The patient presents to the office for evaluation of DVT and PE.  PE and DVT were identified at [facility] by Duplex ultrasound and CT scan  The initial symptoms were pain and swelling in the lower extremity and chest pain.  The patient notes the leg continues to be very painful with dependency and swells quite a bite.  Symptoms are much better with elevation.  The patient notes minimal edema in the morning which steadily worsens throughout the day.    She has a known history of essential hypertension, lupus noncompliant with medication who presented to the [facility] ER on 12/16/2017 with pleuritic chest pain, also noted to have left leg tightness since yesterday.  No fever or cough.  Felt short of breath.  No hypoxia.  CT chest showed bilateral PE, left leg DVT.  Patient told me she is supposed to take Plaquenil for lupus but she is not regular.   The patient has not been using compression therapy at this point.  No SOB or pleuritic chest pains.  No cough or hemoptysis.  No blood per rectum or blood in any sputum.  No excessive bruising per the patient.  Current Meds  Medication Sig  . ammonium lactate (AMLACTIN) 12 % cream Apply 1 g topically 3 (three) times daily as needed for dry skin.   Marland Kitchen apixaban (ELIQUIS) 5 MG TABS tablet Take 1 tablet (5 mg total) by mouth 2 (two) times daily.  Marland Kitchen atorvastatin (LIPITOR) 20 MG tablet Take 20 mg by mouth daily.  . cetirizine (ZYRTEC) 10 MG tablet Take 10 mg by mouth daily as needed for allergies.  . clobetasol cream (TEMOVATE) 3.22 % Apply 1 application topically 2 (two) times a week.  . hydroxychloroquine (PLAQUENIL) 200 MG tablet Take 200 mg by mouth 2 (two) times daily.  . nicotine (NICODERM CQ - DOSED IN MG/24 HOURS)  14 mg/24hr patch Place 1 patch (14 mg total) onto the skin daily.  . traMADol (ULTRAM) 50 MG tablet Take 1 tablet (50 mg total) by mouth every 6 (six) hours as needed.    Past Medical History:  Diagnosis Date  . Anemia   . Anxiety   . Attention deficit disorder (ADD)   . Collagen vascular disease (HCC)    lupus  . Genital herpes 2015  . 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 Mild depression ([place])   . Numbness     Past Surgical History:  Procedure Laterality Date  . CESAREAN SECTION  2003   [facility]  . DG TOES*L* Bilateral   . DILATATION & CURETTAGE/HYSTEROSCOPY WITH MYOSURE N/A 12/24/2016   Procedure: DILATATION & CURETTAGE/HYSTEROSCOPY WITH MYOSURE;  Surgeon: Malachy Mood, MD;  Location: [facility] ORS;  Service: Gynecology;  Laterality: N/A;  . DILATION AND CURETTAGE OF UTERUS    . HYSTEROSCOPY WITH NOVASURE N/A 03/25/2017   Procedure: HYSTEROSCOPY WITH NOVASURE;  Surgeon: Malachy Mood, MD;  Location: [facility] ORS;  Service: Gynecology;  Laterality: N/A;  . TUBAL LIGATION  2003   [facility]    Social History Social History   Tobacco Use  . Smoking status: Current Every Day Smoker    Packs/day: 1.00    Years: 23.00    Pack  years: 23.00    Types: Cigarettes  . Smokeless tobacco: Never Used  Substance Use Topics  . Alcohol use: Yes    Comment: social  . Drug use: No    Family History Family History  Problem Relation Age of Onset  . High blood pressure Brother   . Diabetes Brother   . Kidney disease Brother   . Lung cancer Mother   No family history of bleeding/clotting disorders, porphyria or autoimmune disease   No Known Allergies   REVIEW OF SYSTEMS (Negative unless checked)  Constitutional: [] Weight loss  [] Fever  [] Chills Cardiac: [] Chest pain   [] Chest pressure   [] Palpitations   [] Shortness of breath when laying flat   [] Shortness of breath with exertion. Vascular:  [] Pain in legs with walking   [] Pain in legs at rest  [x] History  of DVT   [] Phlebitis   [] Swelling in legs   [] Varicose veins   [] Non-healing ulcers Pulmonary:   [] Uses home oxygen   [] Productive cough   [] Hemoptysis   [] Wheeze  [] COPD   [] Asthma Neurologic:  [] Dizziness   [] Seizures   [] History of stroke   [] History of TIA  [] Aphasia   [] Vissual changes   [] Weakness or numbness in arm   [] Weakness or numbness in leg Musculoskeletal:   [] Joint swelling   [] Joint pain   [] Low back pain Hematologic:  [] Easy bruising  [] Easy bleeding   [] Hypercoagulable state   [] Anemic Gastrointestinal:  [] Diarrhea   [] Vomiting  [] Gastroesophageal reflux/heartburn   [] Difficulty swallowing. Genitourinary:  [] Chronic kidney disease   [] Difficult urination  [] Frequent urination   [] Blood in urine Skin:  [] Rashes   [] Ulcers  Psychological:  [] History of anxiety   []  History of major depression.  Physical Examination  Vitals:   01/09/18 0831  BP: 130/80  Pulse: 87  Resp: 16  Weight: 238 lb (108 kg)  Height: 5\' 3"  (1.6 m)   Body mass index is 42.16 kg/m. Gen: WD/WN, NAD Head: [place]/AT, No temporalis wasting.  Ear/Nose/Throat: Hearing grossly intact, nares w/o erythema or drainage, poor dentition Eyes: PER, EOMI, sclera nonicteric.  Neck: Supple, no masses.  No bruit or JVD.  Pulmonary:  Good air movement, clear to auscultation bilaterally, no use of accessory muscles.  Cardiac: RRR, normal S1, S2, no Murmurs. Vascular: scattered varicosities present bilaterally.  Mild venous stasis changes to the legs bilaterally.  2+ soft pitting edema Vessel Right Left  Radial Palpable Palpable  PT Palpable Palpable  DP Palpable Palpable   Gastrointestinal: soft, non-distended. No guarding/no peritoneal signs.  Musculoskeletal: M/S 5/5 throughout.  No deformity or atrophy.  Neurologic: CN 2-12 intact. Pain and light touch intact in extremities.  Symmetrical.  Speech is fluent. Motor exam as listed above. Psychiatric: Judgment intact, Mood & affect appropriate for pt's clinical  situation. Dermatologic: No rashes or ulcers noted.  No changes consistent with cellulitis. Lymph : No Cervical lymphadenopathy, no lichenification or skin changes of chronic lymphedema.  CBC Lab Results  Component Value Date   WBC 4.4 12/17/2017   HGB 12.2 12/17/2017   HCT 36.6 12/17/2017   MCV 93.7 12/17/2017   PLT 238 12/17/2017    BMET    Component Value Date/Time   NA 138 12/17/2017 0322   K 3.2 (L) 12/17/2017 0322   CL 108 12/17/2017 0322   CO2 23 12/17/2017 0322   GLUCOSE 113 (H) 12/17/2017 0322   BUN 13 12/17/2017 0322   CREATININE 0.85 12/17/2017 0322   CALCIUM 8.7 (L) 12/17/2017 6789  GFRNONAA >60 12/17/2017 0322   GFRAA >60 12/17/2017 0322   CrCl cannot be calculated (Patient's most recent lab result is older than the maximum 21 days allowed.).  COAG Lab Results  Component Value Date   INR 1.03 12/16/2017    Radiology Dg Chest 2 View  Result Date: 12/16/2017 CLINICAL DATA:  Chest pain since yesterday, leg pain today, smoker EXAM: CHEST  2 VIEW COMPARISON:  02/03/2008 FINDINGS: Normal heart size, mediastinal contours, and pulmonary vascularity. Increased bronchitic changes. Questionable RIGHT suprahilar nodular density versus summation artifact. Remaining lungs clear. No pleural effusion or pneumothorax. Scattered mild endplate spur formation thoracic spine. IMPRESSION: Bronchitic changes with questionable RIGHT upper lobe nodular density; CT chest recommended to exclude pulmonary nodule. Electronically Signed   By: Lavonia Dana M.D.   On: 12/16/2017 17:19   Ct Angio Chest Pe W And/or Wo Contrast  Result Date: 12/16/2017 CLINICAL DATA:  Chest and leg pain EXAM: CT ANGIOGRAPHY CHEST WITH CONTRAST TECHNIQUE: Multidetector CT imaging of the chest was performed using the standard protocol during bolus administration of intravenous contrast. Multiplanar CT image reconstructions and MIPs were obtained to evaluate the vascular anatomy. CONTRAST:  77mL ISOVUE-370  IOPAMIDOL (ISOVUE-370) INJECTION 76% COMPARISON:  Radiograph 12/16/2017 FINDINGS: Cardiovascular: Satisfactory opacification of the pulmonary arteries to the segmental level. Multiple small filling defects within segmental and subsegmental right and left upper lobe arteries with additional small nonocclusive emboli within subsegmental bilateral lower lobe pulmonary arteries. Nonaneurysmal aorta. Borderline cardiomegaly. No pericardial effusion. Mediastinum/Nodes: Midline trachea. 15 mm hypodense nodule left lobe of thyroid. No significant adenopathy. Water density 2.5 cm oval lesion adjacent to the distal esophagus, series 4, image number 48. Esophagus otherwise unremarkable Lungs/Pleura: No parenchymal abnormality to correspond to the right upper abnormality question on radiograph. No consolidation or effusion. Negative for a pneumothorax. Upper Abdomen: No acute abnormality. Musculoskeletal: No chest wall abnormality. No acute or significant osseous findings. Review of the MIP images confirms the above findings. IMPRESSION: 1. Positive for small bilateral segmental and subsegmental pulmonary emboli involving the upper and lower lobes. 2. No focal pulmonary airspace disease is visualized 3. 15 mm left lobe of thyroid hypodense nodule, nonemergent thyroid ultrasound evaluation as indicated 4. 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 Critical Value/emergent results were called by telephone at the time of interpretation on 12/16/2017 at 8:15 pm to Dr. Lenise Arena , who verbally acknowledged these results. Electronically Signed   By: Donavan Foil M.D.   On: 12/16/2017 20:15   US Venous Img Lower Unilateral Left  Result Date: 12/16/2017 CLINICAL DATA:  Left lower extremity pain and swelling for 1 day. EXAM: LEFT LOWER EXTREMITY VENOUS DOPPLER ULTRASOUND TECHNIQUE: Gray-scale sonography with graded compression, as well as color Doppler and duplex ultrasound were  performed to evaluate the lower extremity deep venous systems from the level of the common femoral vein and including the common femoral, femoral, profunda femoral, popliteal and calf veins including the posterior tibial, peroneal and gastrocnemius veins when visible. The superficial great saphenous vein was also interrogated. Spectral Doppler was utilized to evaluate flow at rest and with distal augmentation maneuvers in the common femoral, femoral and popliteal veins. COMPARISON:  None. FINDINGS: Contralateral Common Femoral Vein: Respiratory phasicity is normal and symmetric with the symptomatic side. No evidence of thrombus. Normal compressibility. Common Femoral Vein: No evidence of thrombus. Normal compressibility, respiratory phasicity and response to augmentation. Saphenofemoral Junction: No evidence of thrombus. Normal compressibility and flow on color Doppler imaging.  Profunda Femoral Vein: No evidence of thrombus. Normal compressibility and flow on color Doppler imaging. Femoral Vein: No evidence of thrombus. Normal compressibility, respiratory phasicity and response to augmentation. Popliteal Vein: No evidence of thrombus. Normal compressibility, respiratory phasicity and response to augmentation. Calf Veins: Occlusive thrombus is seen the peroneal veins. Superficial Great Saphenous Vein: No evidence of thrombus. Normal compressibility. Venous Reflux:  None. Other Findings:  None. IMPRESSION: Occlusive thrombus in the peroneal veins of the calf. The examination is otherwise negative. Electronically Signed   By: Inge Rise M.D.   On: 12/16/2017 18:36     Assessment/Plan 1. Other pulmonary embolism without acute cor pulmonale, unspecified chronicity (HCC) Recommend:   No surgery or intervention at this point in time.  IVC filter is not indicated at present.  Patient's duplex ultrasound of the venous system shows DVT in the left tibial veins.  The patient is initiated on anticoagulation  and is tolerating her Eliquis  Elevation was stressed, use of a recliner was discussed.  I have had a long discussion with the patient regarding DVT and post phlebitic changes such as swelling and why it  causes symptoms such as pain.  The patient will wear graduated compression stockings class 1 (20-30 mmHg), beginning after three full days of anticoagulation, on a daily basis a prescription was given. The patient will  beginning wearing the stockings first thing in the morning and removing them in the evening. The patient is instructed specifically not to sleep in the stockings.  In addition, behavioral modification including elevation during the day and avoidance of prolonged dependency will be initiated.    The patient will continue anticoagulation for now as there have not been any problems or complications at this point.    2. 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

## 2018-01-19 DIAGNOSIS — L930 Discoid lupus erythematosus: Secondary | ICD-10-CM | POA: Diagnosis not present

## 2018-01-19 DIAGNOSIS — M3219 Other organ or system involvement in systemic lupus erythematosus: Secondary | ICD-10-CM | POA: Diagnosis not present

## 2018-01-19 DIAGNOSIS — I2699 Other pulmonary embolism without acute cor pulmonale: Secondary | ICD-10-CM | POA: Diagnosis not present

## 2018-03-20 ENCOUNTER — Ambulatory Visit: Payer: BLUE CROSS/BLUE SHIELD | Admitting: Internal Medicine

## 2018-03-20 ENCOUNTER — Encounter: Payer: Self-pay | Admitting: Internal Medicine

## 2018-03-20 VITALS — BP 112/62 | HR 84 | Temp 98.5°F | Ht 63.0 in | Wt 235.8 lb

## 2018-03-20 DIAGNOSIS — Z79899 Other long term (current) drug therapy: Secondary | ICD-10-CM

## 2018-03-20 DIAGNOSIS — Z13220 Encounter for screening for lipoid disorders: Secondary | ICD-10-CM | POA: Diagnosis not present

## 2018-03-20 DIAGNOSIS — M329 Systemic lupus erythematosus, unspecified: Secondary | ICD-10-CM

## 2018-03-20 DIAGNOSIS — Z720 Tobacco use: Secondary | ICD-10-CM | POA: Insufficient documentation

## 2018-03-20 DIAGNOSIS — B370 Candidal stomatitis: Secondary | ICD-10-CM

## 2018-03-20 DIAGNOSIS — I824Y9 Acute embolism and thrombosis of unspecified deep veins of unspecified proximal lower extremity: Secondary | ICD-10-CM

## 2018-03-20 DIAGNOSIS — N83202 Unspecified ovarian cyst, left side: Secondary | ICD-10-CM | POA: Diagnosis not present

## 2018-03-20 DIAGNOSIS — R739 Hyperglycemia, unspecified: Secondary | ICD-10-CM | POA: Diagnosis not present

## 2018-03-20 DIAGNOSIS — N926 Irregular menstruation, unspecified: Secondary | ICD-10-CM | POA: Insufficient documentation

## 2018-03-20 DIAGNOSIS — E559 Vitamin D deficiency, unspecified: Secondary | ICD-10-CM | POA: Diagnosis not present

## 2018-03-20 DIAGNOSIS — M545 Low back pain, unspecified: Secondary | ICD-10-CM | POA: Insufficient documentation

## 2018-03-20 DIAGNOSIS — D219 Benign neoplasm of connective and other soft tissue, unspecified: Secondary | ICD-10-CM | POA: Insufficient documentation

## 2018-03-20 DIAGNOSIS — I2699 Other pulmonary embolism without acute cor pulmonale: Secondary | ICD-10-CM

## 2018-03-20 DIAGNOSIS — Z1389 Encounter for screening for other disorder: Secondary | ICD-10-CM | POA: Diagnosis not present

## 2018-03-20 DIAGNOSIS — E041 Nontoxic single thyroid nodule: Secondary | ICD-10-CM | POA: Insufficient documentation

## 2018-03-20 DIAGNOSIS — Z0000 Encounter for general adult medical examination without abnormal findings: Secondary | ICD-10-CM | POA: Diagnosis not present

## 2018-03-20 MED ORDER — CLOTRIMAZOLE 10 MG MT TROC: 10.0000 mg | Freq: Every day | OROMUCOSAL | 0 refills | Status: DC

## 2018-03-20 NOTE — Patient Instructions (Addendum)
Try Anbesol for mouth  Please schedule eye appt  Please appt with hematology/oncology to work up blood clots  Make sure mammogram is 3D and fax me report   Thank you   Oral Thrush, Adult Oral thrush is an infection in your mouth and throat. It causes white patches on your tongue and in your mouth. Follow these instructions at home: Helping with soreness  To lessen your pain: ? Drink cold liquids, like water and iced tea. ? Eat frozen ice pops or frozen juices. ? Eat foods that are easy to swallow, like gelatin and ice cream. ? Drink from a straw if the patches in your mouth are painful. General instructions   Take or use over-the-counter and prescription medicines only as told by your doctor. Medicine for oral thrush may be something to swallow, or it may be something to put on the infected area.  Eat plain yogurt that has live cultures in it. Read the label to make sure.  If you wear dentures: ? Take out your dentures before you go to bed. ? Brush them well. ? Soak them in a denture cleaner.  Rinse your mouth with warm salt-water many times a day. To make the salt-water mixture, completely dissolve 1/2-1 teaspoon of salt in 1 cup of warm water. Contact a doctor if:  Your problems are getting worse.  Your problems do not get better in less than 7 days with treatment.  Your infection is spreading. This may show as white patches on the skin outside of your mouth.  You are nursing your baby and you have redness and pain in the nipples. This information is not intended to replace advice given to you by your health care provider. Make sure you discuss any questions you have with your health care provider. Document Released: 02/02/2010 Document Revised: 08/02/2016 Document Reviewed: 08/02/2016 Elsevier Interactive Patient Education  2017 [employer].    Deep Vein Thrombosis Deep vein thrombosis (DVT) is a condition in which a blood clot forms in a deep vein, such as a lower  leg, thigh, or arm vein. A clot is blood that has thickened into a gel or solid. This condition is dangerous. It can lead to serious and even life-threatening complications if the clot travels to the lungs and causes a blockage (pulmonary embolism). It can also damage veins in the leg. This can result in leg pain, swelling, discoloration, and sores (post-thrombotic syndrome). What are the causes? This condition may be caused by:  A slowdown of blood flow.  Damage to a vein.  A condition that makes blood clot more easily.  What increases the risk? The following factors may make you more likely to develop this condition:  Being overweight.  Being elderly, especially over age 44.  Sitting or lying down for more than four hours.  Lack of physical activity (sedentary lifestyle).  Being pregnant, giving birth, or having recently given birth.  Taking medicines that contain estrogen.  Smoking.  A history of any of the following: ? Blood clots or blood clotting disease. ? Peripheral vascular disease. ? Inflammatory bowel disease. ? Cancer. ? Heart disease. ? Genetic conditions that affect how blood clots. ? Neurological diseases that affect the legs (leg paresis). ? Injury. ? Major or lengthy surgery. ? A central line placed inside a large vein.  What are the signs or symptoms? Symptoms of this condition include:  Swelling, pain, or tenderness in an arm or leg.  Warmth, redness, or discoloration in an arm or  leg.  If the clot is in your leg, symptoms may be more noticeable or worse when you stand or walk. Some people do not have any symptoms. How is this diagnosed? This condition is diagnosed with:  A medical history.  A physical exam.  Tests, such as: ? Blood tests. These are done to see how your blood clots. ? Imaging tests. These are done to check for clots. Tests may include:  Ultrasound.  CT scan.  MRI.  X-ray.  Venogram. For this test, X-rays are taken  after a dye is injected into a vein.  How is this treated? Treatment for this condition depends on the cause, your risk for bleeding or developing more clots, and any medical conditions you have. Treatment may include:  Taking blood thinners (also called anticoagulants). These medicines may be taken by mouth, injected under the skin, or injected through an IV tube (catheter). These medicines prevent clots from forming.  Injecting medicine that dissolves blood clots into the affected vein (catheter-directed thrombolysis).  Having surgery. Surgery may be done to: ? Remove the clot. ? Place a filter in a large vein to catch blood clots before they reach the lungs.  Some treatments may be continued for up to six months. Follow these instructions at home: If you are taking an oral blood thinner:  Take the medicine exactly as told by your health care provider. Some blood thinners need to be taken at the same time every day. Do not skip a dose.  Ask your health care provider about what foods and drugs interact with the medicine.  Ask about possible side effects. General instructions  Blood thinners can cause easy bruising and difficulty stopping bleeding. Because of this, if you are taking or were given a blood thinner: ? Hold pressure over cuts for longer than usual. ? Tell your dentist and other health care providers that you are taking blood thinners before having any procedures that can cause bleeding. ? Avoid contact sports.  Take over-the-counter and prescription medicines only as told by your health care provider.  Return to your normal activities as told by your health care provider. Ask your health care provider what activities are safe for you.  Wear compression stockings if recommended by your health care provider.  Keep all follow-up visits as told by your health care provider. This is important. How is this prevented? To lower your risk of developing this condition  again:  For 30 or more minutes every day, do an activity that: ? Involves moving your arms and legs. ? Increases your heart rate.  When traveling for longer than four hours: ? Exercise your arms and legs every hour. ? Drink plenty of water. ? Avoid drinking alcohol.  Avoid sitting or lying for a long time without moving your legs.  Stay a healthy weight.  If you are a woman who is older than age 36, avoid unnecessary use of medicines that contain estrogen.  Do not use any products that contain nicotine or tobacco, such as cigarettes and e-cigarettes. This is especially important if you take estrogen medicines. If you need help quitting, ask your health care provider.  Contact a health care provider if:  You miss a dose of your blood thinner.  You have nausea, vomiting, or diarrhea that lasts for more than one day.  Your menstrual period is heavier than usual.  You have unusual bruising. Get help right away if:  You have new or increased pain, swelling, or redness in an  arm or leg.  You have numbness or tingling in an arm or leg.  You have shortness of breath.  You have chest pain.  You have a rapid or irregular heartbeat.  You feel light-headed or dizzy.  You cough up blood.  There is blood in your vomit, stool, or urine.  You have a serious fall or accident, or you hit your head.  You have a severe headache or confusion.  You have a cut that will not stop bleeding. These symptoms may represent a serious problem that is an emergency. Do not wait to see if the symptoms will go away. Get medical help right away. Call your local emergency services (911 in the [place] Do not drive yourself to the hospital. Summary  DVT is a condition in which a blood clot forms in a deep vein, such as a lower leg, thigh, or arm vein.  Symptoms can include swelling, warmth, pain, and redness in your leg or arm.  Treatment may include taking blood thinners, injecting medicine that  dissolves blood clots,wearing compression stockings, or surgery.  If you are prescribed blood thinners, take them exactly as told. This information is not intended to replace advice given to you by your health care provider. Make sure you discuss any questions you have with your health care provider. Document Released: 11/08/2005 Document Revised: 12/11/2016 Document Reviewed: 12/11/2016 Elsevier Interactive Patient Education  2018 [employer].   Pulmonary Embolism A pulmonary embolism (PE) is a sudden blockage or decrease of blood flow in one lung or both lungs. Most blockages come from a blood clot that forms in a lower leg, thigh, or arm vein (deep vein thrombosis, DVT) and travels to the lungs. A clot is blood that has thickened into a gel or solid. PE is a dangerous and life-threatening condition that needs to be treated right away. What are the causes? This condition is usually caused by a blood clot that forms in a vein and moves to the lungs. In rare cases, it may be caused by air, fat, part of a tumor, or other tissue that moves through the veins and into the lungs. What increases the risk? The following factors may make you more likely to develop this condition:  Having DVT or a history of DVT.  Being older than age 47.  Personal or family history of blood clots or blood clotting disease.  Major or lengthy surgery.  Orthopedic surgery, especially hip or knee replacement.  Traumatic injury, such as breaking a hip or leg.  Spinal cord injury.  Stroke.  Taking medicines that contain estrogen. These include birth control pills and hormone replacement therapy.  Long-term (chronic) lung or heart disease.  Cancer and chemotherapy.  Having a central venous catheter.  Pregnancy and the period after delivery.  What are the signs or symptoms? Symptoms of this condition usually start suddenly and include:  Shortness of breath while active or at rest.  Coughing or  coughing up blood or blood-tinged mucus.  Chest pain that is often worse with deep breaths.  Rapid or irregular heartbeat.  Feeling light-headed or dizzy.  Fainting.  Feeling anxious.  Sweating.  Pain and swelling in a leg. This is a symptom of DVT, which can lead to PE.  How is this diagnosed? This condition may be diagnosed based on:  Your medical history.  A physical exam.  Blood tests to check blood oxygen level and how well your blood clots, and a D-dimer blood test, which checks your blood  for a substance that is released when a blood clot breaks apart.  CT pulmonary angiogram. This test checks blood flow in and around your lungs.  Ventilation-perfusion scan, also called a lung VQ scan. This test measures air flow and blood flow to the lungs.  Ultrasound of the legs to look for blood clots.  How is this treated? Treatment for this conditions depends on many factors, such as the cause of your PE, your risk for bleeding or developing more clots, and other medical conditions you have. Treatment aims to remove, dissolve, or stop blood clots from forming or growing larger. Treatment may include:  Blood thinning medicines (anticoagulants) to stop clots from forming or growing. These medicines may be given as a pill, as an injection, or through an IV tube (infusion).  Medicines that dissolve clots (thrombolytics).  A procedure in which a flexible tube is used to remove a blood clot (embolectomy) or deliver medicine to destroy it (catheter-directed thrombolysis).  A procedure in which a filter is inserted into a large vein that carries blood to the heart (inferior vena cava). This filter (vena cava filter) catches blood clots before they reach the lungs.  Surgery to remove the clot (surgical embolectomy). This is rare.  You may need a combination of immediate, long-term (up to 3 months after diagnosis), and extended (more than 3 months after diagnosis) treatments. Your  treatment may continue for several months (maintenance therapy). You and your health care provider will work together to choose the treatment program that is best for you. Follow these instructions at home: If you are taking an anticoagulant medicine:  Take the medicine every day at the same time each day.  Understand what foods and drugs interact with your medicine.  Understand the side effects of this medicine, including excessive bruising or bleeding. Ask your health care provider or pharmacist about other side effects. General instructions  Take over-the-counter and prescription medicines only as told by your health care provider.  Anticoagulant medicines may cause side effects, including easy bruising and difficulty stopping bleeding. If you are prescribed an anticoagulant: ? Hold pressure over cuts for longer than usual. ? Tell your dentist and other health care providers that you are taking anticoagulants before you have any procedure that may cause bleeding. ? Avoid contact sports. ? Be extra careful when handling sharp objects. ? Use a soft toothbrush. Floss with waxed dental floss. ? Shave with an Copy.  Wear a medical alert bracelet or carry a medical alert card that says you have had a PE.  Ask your health care provider when you may return to your normal activities.  Talk with your health care provider about any travel plans. It is important to make sure that you are still able to take your medicine while on trips.  Keep all follow-up visits as told by your health care provider. This is important. How is this prevented? Take these actions to lower your risk of developing another PE:  Exercise regularly. Take frequent walks. For at least 30 minutes every day, engage in: ? Activity that involves moving your arms and legs. ? Activity that encourages good blood flow through your body by increasing your heart rate.  While traveling, drink plenty of water and avoid  drinking alcohol. Ask your health care provider if you should wear below-the-knee compression stockings.  Avoid sitting or lying in bed for long periods of time without moving your legs. Exercise your arms and legs every hour during long-distance travel (  over 4 hours).  If you are hospitalized or have surgery, ask your health care provider about your risks and what treatments can help prevent blood clots.  Maintain a healthy weight. Ask your health care provider what weight is healthy for you.  If you are a woman who is over age 68, avoid unnecessary use of medicines that contain estrogen, including birth control pills.  Do not use any products that contain nicotine or tobacco, such as cigarettes and e-cigarettes. This is especially important if you take estrogen medicines. If you need help quitting, ask your health care provider.  See your health care provider for regular checkups. This may include blood tests and ultrasound testing on your legs to check for new blood clots.  Contact a health care provider if:  You missed a dose of your blood thinner medicine. Get help right away if:  You have new or increased pain, swelling, warmth, or redness in an arm or leg.  You have numbness or tingling in an arm or leg.  You have shortness of breath while active or at rest.  You have chest pain.  You have a rapid or irregular heartbeat.  You feel light-headed or dizzy.  You cough up blood.  You have blood in your vomit, stool, or urine.  You have a fever.  You have abdomen (abdominal) pain.  You have a severe fall or head injury.  You have a severe headache.  You have vision changes.  You cannot move your arms or legs.  You are confused or have memory loss.  You are bleeding for 10 minutes or more, even with strong pressure on the wound. These symptoms may represent a serious problem that is an emergency. Do not wait to see if the symptoms will go away. Get medical help right  away. Call your local emergency services (911 in the [place] Do not drive yourself to the hospital. Summary  A pulmonary embolism (PE) is a sudden blockage or decrease of blood flow in one lung or both lungs. PE is a dangerous and life-threatening condition that needs to be treated right away.  Having deep vein thrombosis (DVT) or a history of DVT is the most common risk factor for PE.  Treatments for this condition usually include medicines to thin your blood (anticoagulants) or medicines to break apart blood clots (thrombolytics).  If you are prescribed blood thinners, it is important to take the medicine every single day at the same time each day.  If you have signs of PE or DVT, call your local emergency services (911 in the [place] This information is not intended to replace advice given to you by your health care provider. Make sure you discuss any questions you have with your health care provider. Document Released: 11/05/2000 Document Revised: 12/11/2016 Document Reviewed: 12/11/2016 Elsevier Interactive Patient Education  2018 [employer].

## 2018-03-20 NOTE — Progress Notes (Addendum)
Chief Complaint  Patient presents with  . New Patient (Initial Visit)   New patient  1. 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 Needs eye exam  3. S/p ablation 2017 Dr. Glennon Mac still bleeding had  Cycle 2x last month used 8 pads qd and before ablation changed pads super and tampon every 2 hours.  Cycles still heavy though less and having 2 cycles per day  4. Tobacco abuse 1/2 ppd smoker since 89 y.o max 1 ppd mom FH lung cancer tried Chantix before and quit x 6 months   Review of Systems  Constitutional: Negative for weight loss.  HENT: Negative for hearing loss.   Respiratory: Negative for shortness of breath.   Cardiovascular: Negative for chest pain.  Gastrointestinal: Negative for abdominal pain.  Musculoskeletal: Positive for back pain.  Skin: Negative for rash.  Neurological: Negative for headaches.  Psychiatric/Behavioral: Negative for depression.   Past Medical History:  Diagnosis Date  . Anemia   . Anxiety   . Attention deficit disorder (ADD)   . Collagen vascular disease (HCC)    lupus  . DVT of leg (deep venous thrombosis) ([place])   . Genital herpes 2015  . Hyperlipidemia   . Lupus ([place])    SEES DR Duard Brady KERNODLE  Discoid  . Mild depression ([place])   . Numbness   . Pulmonary emboli ([place])   . UTI (urinary tract infection)    Past Surgical History:  Procedure Laterality Date  . ABLATION     2017 Dr. Livingston Diones   . CESAREAN SECTION  2003   [facility]  . DG TOES*L* Bilateral    2 bones removed in small toes 2002   . DILATATION & CURETTAGE/HYSTEROSCOPY WITH MYOSURE N/A 12/24/2016   Procedure: [place];  Surgeon: Malachy Mood, MD;  Location: [facility] ORS;  Service: Gynecology;  Laterality: N/A;  . DILATION AND CURETTAGE OF UTERUS    . HYSTEROSCOPY WITH NOVASURE N/A 03/25/2017   Procedure: HYSTEROSCOPY WITH  NOVASURE;  Surgeon: Malachy Mood, MD;  Location: [facility] ORS;  Service: Gynecology;  Laterality: N/A;  . TUBAL LIGATION  2003   [facility]   Family History  Problem Relation Age of Onset  . High blood pressure Brother   . Diabetes Brother   . Kidney disease Brother   . Lung cancer Mother    Social History   Socioeconomic History  . Marital status: Single    Spouse name: Not on file  . Number of children: 3  . Years of education: Not on file  . Highest education level: Not on file  Occupational History    Comment: [employer]  Social Needs  . Financial resource strain: Not on file  . Food insecurity:    Worry: Not on file    Inability: Not on file  . Transportation needs:    Medical: Not on file    Non-medical: Not on file  Tobacco Use  . Smoking status: Current Every Day Smoker    Packs/day: 1.00    Years: 23.00    Pack years: 23.00    Types: Cigarettes  . Smokeless tobacco: Never Used  Substance and Sexual Activity  . Alcohol use: Yes    Comment: social  . Drug use: No  . Sexual activity: Yes    Birth control/protection: Surgical  Lifestyle  . Physical activity:    Days  per week: Not on file    Minutes per session: Not on file  . Stress: Not on file  Relationships  . Social connections:    Talks on phone: Not on file    Gets together: Not on file    Attends religious service: Not on file    Active member of club or organization: Not on file    Attends meetings of clubs or organizations: Not on file    Relationship status: Not on file  . Intimate partner violence:    Fear of current or ex partner: Not on file    Emotionally abused: Not on file    Physically abused: Not on file    Forced sexual activity: Not on file  Other Topics Concern  . Not on file  Social History Narrative   Patient is single and lives at home with her daughter. Patient works at [employer]. Patient has three children.   Right handed.    Caffeine -5   3 kids ages 35 girl,  50 boy 80 girl    Some college    Surveyor, mining at [employer]  Medication Sig  . atorvastatin (LIPITOR) 20 MG tablet Take 20 mg by mouth daily.  . cetirizine (ZYRTEC) 10 MG tablet Take 10 mg by mouth daily as needed for allergies.  . hydroxychloroquine (PLAQUENIL) 200 MG tablet Take 200 mg by mouth 2 (two) times daily.  . traMADol (ULTRAM) 50 MG tablet Take 1 tablet (50 mg total) by mouth every 6 (six) hours as needed.   No Known Allergies No results found for this or any previous visit (from the past 2160 hour(s)). Objective  Body mass index is 41.77 kg/m. Wt Readings from Last 3 Encounters:  03/20/18 235 lb 12.8 oz (107 kg)  01/09/18 238 lb (108 kg)  12/17/17 234 lb (106.1 kg)   Temp Readings from Last 3 Encounters:  03/20/18 98.5 F (36.9 C) (Oral)  12/17/17 (!) 97.3 F (36.3 C) (Oral)  04/01/17 98.9 F (37.2 C) (Oral)   BP Readings from Last 3 Encounters:  03/20/18 112/62  01/09/18 130/80  12/17/17 96/73   Pulse Readings from Last 3 Encounters:  03/20/18 84  01/09/18 87  12/17/17 80    Physical Exam  Constitutional: She is oriented to person, place, and time. Vital signs are normal. She appears well-developed and well-nourished. She is cooperative.  HENT:  Head: Normocephalic and atraumatic.  Mouth/Throat: Oropharynx is clear and moist and mucous membranes are normal.  C/w thrush to tongue   Eyes: Pupils are equal, round, and reactive to light. Conjunctivae are normal.  Cardiovascular: Normal rate, regular rhythm and normal heart sounds.  Pulmonary/Chest: Effort normal and breath sounds normal.  Neurological: She is alert and oriented to person, place, and time. Gait normal.  Skin: Skin is warm, dry and intact.  Psychiatric: She has a normal mood and affect. Her speech is normal and behavior is normal. Judgment and thought content normal. Cognition and memory are normal.  Nursing note and vitals reviewed.   Assessment   1. DVT/PT noted  12/16/17  2. SLE/discoid lupus  3. Irregular menses  4. Tobacco abuse  5. Thyroid nodule  6. HM 7. thrush Plan  1. Refer to [facility] 2. F/u [facility] rheum Dr. Jefm Bryant  Referred eye exam on plaquenil  3. F/u OB/GYN D.r Glennon Mac in future  Get copy last pap  4.  rec smoking cessation  Does not want chantix for now  5. Thyroid US  6.  Ask about vaccines at f/u  -Tdap per pt had in 2018  vx not in [place] getting 03/28/18 a twork  Pap get copy [facility] LMP 03/12/18  -pap 03/10/12 neg pap neg HPV Dr. Glennon Mac no more recent pap  Ordered labs  Dentist [facility]   7. lozengers address at f/u   "I spent 40 minutes face-to face with patient with greater than 50% of time spent counseling and/or in coordination of care chart review, plan of care    Provider: Dr. Olivia Mackie McLean-Scocuzza-Internal Medicine

## 2018-03-20 NOTE — Progress Notes (Signed)
Pre visit review using our clinic review tool, if applicable. No additional management support is needed unless otherwise documented below in the visit note. 

## 2018-03-24 ENCOUNTER — Encounter: Payer: Self-pay | Admitting: Oncology

## 2018-03-24 ENCOUNTER — Inpatient Hospital Stay: Payer: BLUE CROSS/BLUE SHIELD | Attending: Oncology | Admitting: Oncology

## 2018-03-24 ENCOUNTER — Other Ambulatory Visit: Payer: Self-pay

## 2018-03-24 ENCOUNTER — Encounter: Payer: Self-pay | Admitting: Internal Medicine

## 2018-03-24 ENCOUNTER — Inpatient Hospital Stay: Payer: BLUE CROSS/BLUE SHIELD

## 2018-03-24 VITALS — BP 112/74 | HR 95 | Temp 98.4°F | Ht 63.0 in | Wt 235.0 lb

## 2018-03-24 DIAGNOSIS — Z86718 Personal history of other venous thrombosis and embolism: Secondary | ICD-10-CM | POA: Diagnosis not present

## 2018-03-24 DIAGNOSIS — Z801 Family history of malignant neoplasm of trachea, bronchus and lung: Secondary | ICD-10-CM

## 2018-03-24 DIAGNOSIS — D500 Iron deficiency anemia secondary to blood loss (chronic): Secondary | ICD-10-CM | POA: Diagnosis not present

## 2018-03-24 DIAGNOSIS — E785 Hyperlipidemia, unspecified: Secondary | ICD-10-CM

## 2018-03-24 DIAGNOSIS — I2699 Other pulmonary embolism without acute cor pulmonale: Secondary | ICD-10-CM

## 2018-03-24 DIAGNOSIS — F418 Other specified anxiety disorders: Secondary | ICD-10-CM | POA: Diagnosis not present

## 2018-03-24 DIAGNOSIS — N921 Excessive and frequent menstruation with irregular cycle: Secondary | ICD-10-CM

## 2018-03-24 DIAGNOSIS — E611 Iron deficiency: Secondary | ICD-10-CM

## 2018-03-24 DIAGNOSIS — Z7901 Long term (current) use of anticoagulants: Secondary | ICD-10-CM

## 2018-03-24 DIAGNOSIS — Z86711 Personal history of pulmonary embolism: Secondary | ICD-10-CM | POA: Diagnosis not present

## 2018-03-24 DIAGNOSIS — Z79899 Other long term (current) drug therapy: Secondary | ICD-10-CM | POA: Diagnosis not present

## 2018-03-24 DIAGNOSIS — N920 Excessive and frequent menstruation with regular cycle: Secondary | ICD-10-CM

## 2018-03-24 LAB — IRON AND TIBC
Iron: 37 ug/dL (ref 28–170)
SATURATION RATIOS: 12 % (ref 10.4–31.8)
TIBC: 315 ug/dL (ref 250–450)
UIBC: 278 ug/dL

## 2018-03-24 LAB — CBC WITH DIFFERENTIAL/PLATELET
BASOS ABS: 0 10*3/uL (ref 0–0.1)
Basophils Relative: 0 %
EOS PCT: 2 %
Eosinophils Absolute: 0.1 10*3/uL (ref 0–0.7)
HEMATOCRIT: 36.3 % (ref 35.0–47.0)
HEMOGLOBIN: 12.5 g/dL (ref 12.0–16.0)
LYMPHS ABS: 1.1 10*3/uL (ref 1.0–3.6)
LYMPHS PCT: 28 %
MCH: 32.4 pg (ref 26.0–34.0)
MCHC: 34.3 g/dL (ref 32.0–36.0)
MCV: 94.4 fL (ref 80.0–100.0)
Monocytes Absolute: 0.4 10*3/uL (ref 0.2–0.9)
Monocytes Relative: 10 %
NEUTROS ABS: 2.3 10*3/uL (ref 1.4–6.5)
NEUTROS PCT: 60 %
PLATELETS: 232 10*3/uL (ref 150–440)
RBC: 3.85 MIL/uL (ref 3.80–5.20)
RDW: 15.1 % — ABNORMAL HIGH (ref 11.5–14.5)
WBC: 3.8 10*3/uL (ref 3.6–11.0)

## 2018-03-24 LAB — FERRITIN: Ferritin: 12 ng/mL (ref 11–307)

## 2018-03-24 MED ORDER — FERROUS SULFATE 325 (65 FE) MG PO TBEC: 325.0000 mg | DELAYED_RELEASE_TABLET | Freq: Two times a day (BID) | ORAL | 3 refills | Status: DC

## 2018-03-24 MED ORDER — DOCUSATE SODIUM 100 MG PO CAPS: 100.0000 mg | ORAL_CAPSULE | Freq: Every day | ORAL | 2 refills | Status: DC | PRN

## 2018-03-24 NOTE — Progress Notes (Signed)
Patient here today as a new patient with PE and DVT.

## 2018-03-24 NOTE — Progress Notes (Signed)
Hematology/Oncology Consult note [facility] [phone] [phone]   Patient Care Team: McLean-Scocuzza, Nino Glow, MD as PCP - General (Internal Medicine)  REFERRING PROVIDER: McLean-Scocuzza, Nino Glow, MD CHIEF COMPLAINTS/PURPOSE OF CONSULTATION:  Evaluation of DVT/PE  HISTORY OF PRESENTING ILLNESS:  Melinda Ross is a  47 y.o.  female with PMH listed below who was referred to me for evaluation of DVT/PE In January 2019, patient had a unprovoked distal left lower extremity DVT and bilateral PE.  Denies any triggering factors including car trip, airflight, recent injury/surgery, or any other immobilization factors.  Denies takin oral contraceptives.  She is on her feet all day long for her work. Patient was started on IV heparin drip at discharge switch to Eliquis.  Patient currently takes Eliquis 5 mg twice daily.Denies hematochezia, hematuria, hematemesis, epistaxis, black tarry stool or easy bruising.   She has irregular and heavy menstrual periods Patient has a history of discoid lupus, follows up with rheumatology Dr. Jefm Bryant.  On Plaquenil. She has had factor V Leiden mutation tested negative, protein C and protein S panel normal.  Family history: Mother had lung cancer.  No other family history of cancer mother's side.  She does not know her father's side Denies any weight loss, fatigue, night sweats, fever or chills. Review of Systems  Constitutional: Negative for chills, fever, malaise/fatigue and weight loss.  HENT: Negative for congestion, ear discharge, ear pain, nosebleeds, sinus pain and sore throat.   Eyes: Negative for double vision, photophobia, pain, discharge and redness.  Respiratory: Negative for cough, hemoptysis, sputum production, shortness of breath and wheezing.   Cardiovascular: Negative for chest pain, palpitations, orthopnea, claudication and leg swelling.  Gastrointestinal: Negative for abdominal pain, blood in  stool, constipation, diarrhea, heartburn, melena, nausea and vomiting.  Genitourinary: Negative for dysuria, flank pain, frequency and hematuria.  Musculoskeletal: Negative for back pain, myalgias and neck pain.  Skin: Negative for itching and rash.  Neurological: Negative for dizziness, tingling, tremors, focal weakness, weakness and headaches.  Endo/Heme/Allergies: Negative for environmental allergies. Does not bruise/bleed easily.  Psychiatric/Behavioral: Negative for depression and hallucinations. The patient is not nervous/anxious.     MEDICAL HISTORY:  Past Medical History:  Diagnosis Date  . Anemia   . Anxiety   . Attention deficit disorder (ADD)   . Collagen vascular disease (HCC)    lupus  . DVT of leg (deep venous thrombosis) ([place])   . Genital herpes 2015  . Hyperlipidemia   . Lupus ([place])    SEES DR Duard Brady KERNODLE  Discoid  . Mild depression ([place])   . Numbness   . Pulmonary emboli ([place])   . UTI (urinary tract infection)     SURGICAL HISTORY: Past Surgical History:  Procedure Laterality Date  . ABLATION     2017 Dr. Livingston Diones   . CESAREAN SECTION  2003   [facility]  . DG TOES*L* Bilateral    2 bones removed in small toes 2002   . DILATATION & CURETTAGE/HYSTEROSCOPY WITH MYOSURE N/A 12/24/2016   Procedure: [place];  Surgeon: Malachy Mood, MD;  Location: [facility] ORS;  Service: Gynecology;  Laterality: N/A;  . DILATION AND CURETTAGE OF UTERUS    . HYSTEROSCOPY WITH NOVASURE N/A 03/25/2017   Procedure: HYSTEROSCOPY WITH NOVASURE;  Surgeon: Malachy Mood, MD;  Location: [facility] ORS;  Service: Gynecology;  Laterality: N/A;  . TUBAL LIGATION  2003   [facility]    SOCIAL HISTORY: Social History   Socioeconomic History  .  Marital status: Single    Spouse name: Not on file  . Number of children: 3  . Years of education: Not on file  . Highest education level: Not on file  Occupational History     Comment: [employer]  Social Needs  . Financial resource strain: Not on file  . Food insecurity:    Worry: Not on file    Inability: Not on file  . Transportation needs:    Medical: Not on file    Non-medical: Not on file  Tobacco Use  . Smoking status: Current Every Day Smoker    Packs/day: 1.00    Years: 30.00    Pack years: 30.00    Types: Cigarettes  . Smokeless tobacco: Never Used  Substance and Sexual Activity  . Alcohol use: Yes    Comment: social - once every 2-3 months   . Drug use: No  . Sexual activity: Yes    Birth control/protection: Surgical  Lifestyle  . Physical activity:    Days per week: Not on file    Minutes per session: Not on file  . Stress: Not on file  Relationships  . Social connections:    Talks on phone: Not on file    Gets together: Not on file    Attends religious service: Not on file    Active member of club or organization: Not on file    Attends meetings of clubs or organizations: Not on file    Relationship status: Not on file  . Intimate partner violence:    Fear of current or ex partner: Not on file    Emotionally abused: Not on file    Physically abused: Not on file    Forced sexual activity: Not on file  Other Topics Concern  . Not on file  Social History Narrative   Patient is single and lives at home with her daughter. Patient works at [employer]. Patient has three children.   Right handed.    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 High blood pressure Brother   . Diabetes Brother   . Kidney disease Brother   . Arthritis Brother   . Birth defects Brother   . Lung cancer Mother   . COPD Mother   . Hearing loss Father   . Heart disease Father   . Hypertension Father   . Heart disease Sister   . Hypertension Sister   . Depression Daughter     ALLERGIES:  has No Known  Allergies.  MEDICATIONS:  Current Outpatient Medications  Medication Sig Dispense Refill  . atorvastatin (LIPITOR) 20 MG tablet Take 20 mg by mouth daily.    . cetirizine (ZYRTEC) 10 MG tablet Take 10 mg by mouth daily as needed for allergies.    . clotrimazole (MYCELEX) 10 MG troche Take 1 tablet (10 mg total) by mouth 5 (five) times daily. 35 tablet 0  . ELIQUIS 5 MG TABS tablet Take 5 mg by mouth daily.  3  . hydroxychloroquine (PLAQUENIL) 200 MG tablet Take 200 mg by mouth 2 (two) times daily.     No current facility-administered medications for this visit.      PHYSICAL EXAMINATION: ECOG PERFORMANCE STATUS: 0 - Asymptomatic Vitals:   03/24/18 1426  BP: 112/74  Pulse: 95  Temp:  98.4 F (36.9 C)  SpO2: 98%   Filed Weights   03/24/18 1426  Weight: 235 lb (106.6 kg)    Physical Exam  Constitutional: She is oriented to person, place, and time. She appears well-developed and well-nourished. No distress.  HENT:  Head: Normocephalic and atraumatic.  Right Ear: External ear normal.  Left Ear: External ear normal.  Mouth/Throat: Oropharynx is clear and moist.  Eyes: Pupils are equal, round, and reactive to light. Conjunctivae and EOM are normal. No scleral icterus.  Neck: Normal range of motion. Neck supple.  Cardiovascular: Normal rate, regular rhythm and normal heart sounds.  Pulmonary/Chest: Effort normal and breath sounds normal. No respiratory distress. She has no wheezes. She has no rales. She exhibits no tenderness.  Abdominal: Soft. Bowel sounds are normal. She exhibits no distension and no mass. There is no tenderness.  Musculoskeletal: Normal range of motion. She exhibits no edema or deformity.  Lymphadenopathy:    She has no cervical adenopathy.  Neurological: She is alert and oriented to person, place, and time. No cranial nerve deficit. Coordination normal.  Skin: Skin is warm and dry. No rash noted.  Psychiatric: She has a normal mood and affect. Her behavior  is normal. Thought content normal.     LABORATORY DATA:  I have reviewed the data as listed Lab Results  Component Value Date   WBC 3.8 03/24/2018   HGB 12.5 03/24/2018   HCT 36.3 03/24/2018   MCV 94.4 03/24/2018   PLT 232 03/24/2018   Recent Labs    04/01/17 0541 12/16/17 1644 12/17/17 0322  NA 136 138 138  K 3.4* 4.2 3.2*  CL 108 109 108  CO2 22 21* 23  GLUCOSE 129* 118* 113*  BUN 8 12 13   CREATININE 0.75 0.86 0.85  CALCIUM 8.3* 8.8* 8.7*  GFRNONAA >60 >60 >60  GFRAA >60 >60 >60  PROT 6.8  --   --   ALBUMIN 3.3*  --   --   AST 32  --   --   ALT 14  --   --   ALKPHOS 66  --   --   BILITOT 0.5  --   --     RADIOGRAPHIC STUDIES: I have personally reviewed the radiological images as listed and agreed with the findings in the report. 12/16/2017 CT Angio chest PE 1. Positive for small bilateral segmental and subsegmental pulmonary emboli involving the upper and lower lobes. 2. No focal pulmonary airspace disease is visualized 3. 15 mm left lobe of thyroid hypodense nodule, nonemergent thyroid ultrasound evaluation as indicated 4. 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  1/93/7902 US venous lower extremity Occlusive thrombus in the peroneal veins of the calf. The examination is otherwise negative  ASSESSMENT & PLAN:  1. Chronic anticoagulation   2. Menorrhagia with irregular cycle   3. Iron deficiency   4. Other acute pulmonary embolism without acute cor pulmonale (HCC)    #Unprovoked lower extremity DVT and bilateral PE: Discussed with patient that I recommend long-term anticoagulation.  She appears to tolerate Eliquis 5 mg twice daily well.  Continue current regimen.  #Chronic anticoagulation/menorrhagia with irregular cycle: Check CBC, iron, ferritin. Labs reviewed, patient has early iron deficiency.  Will start patient on oral iron supplementation with ferrous sulfate 325 twice daily.  Prescription sent to  pharmacy.  Colace as needed  #Hyper coagulable work-up: Patient has negative factor V Leiden mutation.  Protein C and S were normal although  these are usually not checked during acute setting.  We will check prothrombin mutation.  These test results does not change patient's anticoagulation management.  #Age-appropriate cancer screening discussed with patient..  She has mammogram scheduled to be done next week.  All questions were answered. The patient knows to call the clinic with any problems questions or concerns.  Return of visit: CBC, iron, ferritin, follow-up visit in 3 months. Thank you for this kind referral and the opportunity to participate in the care of this patient. A copy of today's note is routed to referring provider    Earlie Server, MD, PhD Hematology Oncology [facility] Pager- [phone] 03/24/2018

## 2018-03-27 ENCOUNTER — Telehealth: Payer: Self-pay | Admitting: *Deleted

## 2018-03-27 NOTE — Telephone Encounter (Signed)
Patient called and asking what medications were sent to pharmacy and what they are for and if there is a cheaper medicine that can be sent in.I returned call to patient and got voice mail. I left message that medications were for iron defiency and stool softener and that she can get over the counter version of each, but to be sure she gata iron equal to 325 mg and to have pharmacist help her choose one

## 2018-03-28 ENCOUNTER — Ambulatory Visit: Payer: BLUE CROSS/BLUE SHIELD

## 2018-03-28 DIAGNOSIS — Z1231 Encounter for screening mammogram for malignant neoplasm of breast: Secondary | ICD-10-CM | POA: Diagnosis not present

## 2018-03-28 NOTE — Telephone Encounter (Signed)
Thanks for the update

## 2018-03-29 ENCOUNTER — Telehealth: Payer: Self-pay

## 2018-03-29 ENCOUNTER — Ambulatory Visit
Admission: RE | Admit: 2018-03-29 | Discharge: 2018-03-29 | Disposition: A | Discharge status: Home / Self Care | Payer: BLUE CROSS/BLUE SHIELD | Source: Ambulatory Visit | Attending: Internal Medicine | Admitting: Internal Medicine

## 2018-03-29 ENCOUNTER — Encounter: Payer: Self-pay | Admitting: Internal Medicine

## 2018-03-29 DIAGNOSIS — E041 Nontoxic single thyroid nodule: Secondary | ICD-10-CM | POA: Insufficient documentation

## 2018-03-29 DIAGNOSIS — E042 Nontoxic multinodular goiter: Secondary | ICD-10-CM | POA: Diagnosis not present

## 2018-03-29 LAB — PROTHROMBIN GENE MUTATION

## 2018-03-29 NOTE — Telephone Encounter (Signed)
Copied from [place]. Topic: Inquiry >> Mar 29, 2018  7:45 AM Scherrie Gerlach wrote: Reason for CRM: pt states she work [employer] and can have her labs done there at no cost.  Pt wants to know if you can fax the labs that pt was to have done tomorrow  to their nurse practioner Sallyanne Havers at Fax:  [phone]  Lab orders have been faxed to the number listed to [facility] as requested.

## 2018-03-30 ENCOUNTER — Other Ambulatory Visit: Payer: Self-pay | Admitting: Internal Medicine

## 2018-03-30 ENCOUNTER — Other Ambulatory Visit: Payer: BLUE CROSS/BLUE SHIELD

## 2018-03-30 DIAGNOSIS — E041 Nontoxic single thyroid nodule: Secondary | ICD-10-CM

## 2018-04-03 ENCOUNTER — Telehealth: Payer: Self-pay | Admitting: Internal Medicine

## 2018-04-03 NOTE — Telephone Encounter (Signed)
Please advise 

## 2018-04-03 NOTE — Telephone Encounter (Signed)
Call pt about endocrine appt for thyroid biopsy she has not heard   Thanks MTS

## 2018-04-03 NOTE — Telephone Encounter (Signed)
Copied from [place] [id number]. Topic: Quick Communication - See Telephone Encounter >> Apr 03, 2018  3:27 PM Rutherford Nail, [place] wrote: CRM for notification. See Telephone encounter for: 04/03/18. Patient calling and states that Fransisco Beau called at [facility] on 03/30/18, stating Dr McLean-Scocuzza thought patient might need to get a biopsy. Patient states that she has not heard anything about updates. Would like to know what the next step is? CB#: [phone]

## 2018-04-04 ENCOUNTER — Encounter (INDEPENDENT_AMBULATORY_CARE_PROVIDER_SITE_OTHER): Payer: Self-pay

## 2018-04-07 DIAGNOSIS — E042 Nontoxic multinodular goiter: Secondary | ICD-10-CM | POA: Diagnosis not present

## 2018-04-12 DIAGNOSIS — E042 Nontoxic multinodular goiter: Secondary | ICD-10-CM | POA: Diagnosis not present

## 2018-04-13 DIAGNOSIS — E042 Nontoxic multinodular goiter: Secondary | ICD-10-CM | POA: Diagnosis not present

## 2018-04-26 ENCOUNTER — Ambulatory Visit: Payer: BLUE CROSS/BLUE SHIELD | Admitting: Internal Medicine

## 2018-04-26 ENCOUNTER — Encounter: Payer: Self-pay | Admitting: Internal Medicine

## 2018-04-26 VITALS — BP 100/70 | HR 80 | Temp 98.4°F | Ht 63.0 in | Wt 241.6 lb

## 2018-04-26 DIAGNOSIS — E785 Hyperlipidemia, unspecified: Secondary | ICD-10-CM | POA: Diagnosis not present

## 2018-04-26 DIAGNOSIS — N926 Irregular menstruation, unspecified: Secondary | ICD-10-CM | POA: Diagnosis not present

## 2018-04-26 DIAGNOSIS — E0520 Thyrotoxicosis with toxic multinodular goiter without thyrotoxic crisis or storm: Secondary | ICD-10-CM

## 2018-04-26 DIAGNOSIS — Z124 Encounter for screening for malignant neoplasm of cervix: Secondary | ICD-10-CM

## 2018-04-26 DIAGNOSIS — E559 Vitamin D deficiency, unspecified: Secondary | ICD-10-CM

## 2018-04-26 DIAGNOSIS — M62838 Other muscle spasm: Secondary | ICD-10-CM | POA: Diagnosis not present

## 2018-04-26 MED ORDER — PRAVASTATIN SODIUM 40 MG PO TABS: 40.0000 mg | ORAL_TABLET | Freq: Every day | ORAL | 3 refills | Status: DC

## 2018-04-26 MED ORDER — CYCLOBENZAPRINE HCL 5 MG PO TABS: 5.0000 mg | ORAL_TABLET | Freq: Every evening | ORAL | 0 refills | Status: DC | PRN

## 2018-04-26 MED ORDER — CHOLECALCIFEROL 1.25 MG (50000 UT) PO CAPS: 50000.0000 [IU] | ORAL_CAPSULE | ORAL | 1 refills | Status: DC

## 2018-04-26 NOTE — Patient Instructions (Addendum)
Docusate or colace  Ferrous sulfate 325 mg 1x -2x per day  Vitamin D3 take 1x per week x 6 months then D3 5000 IU daily  Please f/u OB/GYN Dr. Glennon Mac  Stop Lipitor changing to Pravastatin  Follow in 4 months  Call and sch [place] and f/u appt with Dr. Gabriel Carina in 6 months    Thyroid Nodule A thyroid nodule is an isolatedgrowth of thyroid cells that forms a lump in your thyroid gland. The thyroid gland is a butterfly-shaped gland. It is found in the lower front of your neck. This gland sends chemical messengers (hormones) through your blood to all parts of your body. These hormones are important in regulating your body temperature and helping your body to use energy. Thyroid nodules are common. Most are not cancerous (are benign). You may have one nodule or several nodules. Different types of thyroid nodules include:  Nodules that grow and fill with fluid (thyroid cysts).  Nodules that produce too much thyroid hormone (hot nodules or hyperthyroid).  Nodules that produce no thyroid hormone (cold nodules or hypothyroid).  Nodules that form from cancer cells (thyroid cancers).  What are the causes? Usually, the cause of this condition is not known. What increases the risk? Factors that make this condition more likely to develop include:  Increasing age. Thyroid nodules become more common in people who are older than 45 years of age.  Gender. ? Benign thyroid nodules are more common in women. ? Cancerous (malignant) thyroid nodules are more common in men.  A family history that includes: ? Thyroid nodules. ? Pheochromocytoma. ? Thyroid carcinoma. ? Hyperparathyroidism.  Certain kinds of thyroid diseases, such as Hashimoto thyroiditis.  Lack of iodine.  A history of head and neck radiation, such as from X-rays.  What are the signs or symptoms? It is common for this condition to cause no symptoms. If you have symptoms, they may include:  A lump in your lower neck.  Feeling a lump  or tickle in your throat.  Pain in your neck, jaw, or ear.  Having trouble swallowing.  Hot nodules may cause symptoms that include:  Weight loss.  Warm, flushed skin.  Feeling hot.  Feeling nervous.  A racing heartbeat.  Cold nodules may cause symptoms that include:  Weight gain.  Dry skin.  Brittle hair. This may also occur with hair loss.  Feeling cold.  Fatigue.  Thyroid cancer nodules may cause symptoms that include:  Hard nodules that feel stuck to the thyroid gland.  Hoarseness.  Lumps in the glands near your thyroid (lymph nodes).  How is this diagnosed? A thyroid nodule may be felt by your health care provider during a physical exam. This condition may also be diagnosed based on your symptoms. You may also have tests, including:  An ultrasound. This may be done to confirm the diagnosis.  A biopsy. This involves taking a sample from the nodule and looking at it under a microscope to see if the nodule is benign.  Blood tests to make sure that your thyroid is working properly.  Imaging tests such as MRI or CT scan may be done if: ? Your nodule is large. ? Your nodule is blocking your airway. ? Cancer is suspected.  How is this treated? Treatment depends on the cause and size of your nodule or nodules. If the nodule is benign, treatment may not be necessary. Your health care provider may monitor the nodule to see if it goes away without treatment. If the nodule  continues to grow, is cancerous, or does not go away:  It may need to be drained with a needle.  It may need to be removed with surgery.  If you have surgery, part or all of your thyroid gland may need to be removed as well. Follow these instructions at home:  Pay attention to any changes in your nodule.  Take over-the-counter and prescription medicines only as told by your health care provider.  Keep all follow-up visits as told by your health care provider. This is important. Contact a  health care provider if:  Your voice changes.  You have trouble swallowing.  You have pain in your neck, ear, or jaw that is getting worse.  Your nodule gets bigger.  Your nodule starts to make it harder for you to breathe. Get help right away if:  You have a sudden fever.  You feel very weak.  Your muscles look like they are shrinking (muscle wasting).  You have mood swings.  You feel very restless.  You feel confused.  You are seeing or hearing things that other people do not see or hear (having hallucinations).  You feel suddenly nauseous or throw up.  You suddenly have diarrhea.  You have chest pain.  There is a loss of consciousness. This information is not intended to replace advice given to you by your health care provider. Make sure you discuss any questions you have with your health care provider. Document Released: 10/01/2004 Document Revised: 07/11/2016 Document Reviewed: 02/19/2015 Elsevier Interactive Patient Education  2018 [place].    Muscle Cramps and Spasms Muscle cramps and spasms are when muscles tighten by themselves. They usually get better within minutes. Muscle cramps are painful. They are usually stronger and last longer than muscle spasms. Muscle spasms may or may not be painful. They can last a few seconds or much longer. Follow these instructions at home:  Drink enough fluid to keep your pee (urine) clear or pale yellow.  Massage, stretch, and relax the muscle.  If directed, apply heat to tight or tense muscles as often as told by your doctor. Use the heat source that your doctor recommends. ? Place a towel between your skin and the heat source. ? Leave the heat on for 20-30 minutes. ? Take off the heat if your skin turns bright red. This is especially important if you are unable to feel pain, heat, or cold. You may have a greater risk of getting burned.  If directed, put ice on the affected area. This may help if you are sore or  have pain after a cramp or spasm. ? Put ice in a plastic bag. ? Place a towel between your skin and the bag. ? Leave the ice on for 20 minutes, 2-3 times a day.  Take over-the-counter and prescription medicines only as told by your doctor.  Pay attention to any changes in your symptoms. Contact a doctor if:  Your cramps or spasms get worse or happen more often.  Your cramps or spasms do not get better with time. This information is not intended to replace advice given to you by your health care provider. Make sure you discuss any questions you have with your health care provider. Document Released: 10/21/2008 Document Revised: 12/10/2015 Document Reviewed: 08/12/2015 Elsevier Interactive Patient Education  2018 [employer].    Vitamin D Deficiency Vitamin D deficiency is when your body does not have enough vitamin D. Vitamin D is important to your body for many reasons:  It helps the body to absorb two important minerals, called calcium and phosphorus.  It plays a role in bone health.  It may help to prevent some diseases, such as diabetes and multiple sclerosis.  It plays a role in muscle function, including heart function.  You can get vitamin D by:  Eating foods that naturally contain vitamin D.  Eating or drinking milk or other dairy products that have vitamin D added to them.  Taking a vitamin D supplement or a multivitamin supplement that contains vitamin D.  Being in the sun. Your body naturally makes vitamin D when your skin is exposed to sunlight. Your body changes the sunlight into a form of the vitamin that the body can use.  If vitamin D deficiency is severe, it can cause a condition in which your bones become soft. In adults, this condition is called osteomalacia. In children, this condition is called rickets. What are the causes? Vitamin D deficiency may be caused by:  Not eating enough foods that contain vitamin D.  Not getting enough sun  exposure.  Having certain digestive system diseases that make it difficult for your body to absorb vitamin D. These diseases include Crohn disease, chronic pancreatitis, and cystic fibrosis.  Having a surgery in which a part of the stomach or a part of the small intestine is removed.  Being obese.  Having chronic kidney disease or liver disease.  What increases the risk? This condition is more likely to develop in:  Older people.  People who do not spend much time outdoors.  People who live in a long-term care facility.  People who have had broken bones.  People with weak or thin bones (osteoporosis).  People who have a disease or condition that changes how the body absorbs vitamin D.  People who have dark skin.  People who take certain medicines, such as steroid medicines or certain seizure medicines.  People who are overweight or obese.  What are the signs or symptoms? In mild cases of vitamin D deficiency, there may not be any symptoms. If the condition is severe, symptoms may include:  Bone pain.  Muscle pain.  Falling often.  Broken bones caused by a minor injury.  How is this diagnosed? This condition is usually diagnosed with a blood test. How is this treated? Treatment for this condition may depend on what caused the condition. Treatment options include:  Taking vitamin D supplements.  Taking a calcium supplement. Your health care provider will suggest what dose is best for you.  Follow these instructions at home:  Take medicines and supplements only as told by your health care provider.  Eat foods that contain vitamin D. Choices include: ? Fortified dairy products, cereals, or juices. Fortified means that vitamin D has been added to the food. Check the label on the package to be sure. ? Fatty fish, such as salmon or trout. ? Eggs. ? Oysters.  Do not use a tanning bed.  Maintain a healthy weight. Lose weight, if needed.  Keep all follow-up  visits as told by your health care provider. This is important. Contact a health care provider if:  Your symptoms do not go away.  You feel like throwing up (nausea) or you throw up (vomit).  You have fewer bowel movements than usual or it is difficult for you to have a bowel movement (constipation). This information is not intended to replace advice given to you by your health care provider. Make sure you discuss any questions you  have with your health care provider. Document Released: 01/31/2012 Document Revised: 04/21/2016 Document Reviewed: 03/26/2015 Elsevier Interactive Patient Education  2018 [employer].

## 2018-04-26 NOTE — Progress Notes (Signed)
Pre visit review using our clinic review tool, if applicable. No additional management support is needed unless otherwise documented below in the visit note. 

## 2018-04-26 NOTE — Progress Notes (Addendum)
Chief Complaint  Patient presents with  . Follow-up   F/u  1. 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 Gabriel Carina need to get report  2. Irregular menses still not f/u with Dr. Glennon Mac last pap per records 2013 will refer back. 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 H/o DVT/PE Dr. Tasia Catchings rec chronic anticoagulation on eliquid 5 mg bid DDI with lipitor will change to pravastatin  5. C/o right mid back pain and muscle spasm within the last couple of days daughter tried applying pressure to help no heat tried she tried NSAIDS  Review of Systems  Constitutional: Negative for weight loss.  HENT: Negative for hearing loss.   Eyes: Negative for blurred vision.  Respiratory: Negative for shortness of breath.   Cardiovascular: Negative for chest pain.  Gastrointestinal: Negative for abdominal pain.  Musculoskeletal: Positive for back pain and myalgias.  Skin: Negative for rash.  Psychiatric/Behavioral: Negative for depression.   Past Medical History:  Diagnosis Date  . Anemia   . Anxiety   . Attention deficit disorder (ADD)   . Collagen vascular disease (HCC)    lupus  . DVT of leg (deep venous thrombosis) ([place])   . Genital herpes 2015  . Hyperlipidemia   . Lupus ([place])    SEES DR Duard Brady KERNODLE  Discoid  . Mild depression ([place])   . Numbness   . Pulmonary emboli ([place])   . UTI (urinary tract infection)    Past Surgical History:  Procedure Laterality Date  . ABLATION     2017 Dr. Livingston Diones   . CESAREAN SECTION  2003   [facility]  . DG TOES*L* Bilateral    2 bones removed in small toes 2002   . DILATATION & CURETTAGE/HYSTEROSCOPY WITH MYOSURE N/A 12/24/2016   Procedure: [place];  Surgeon: Malachy Mood, MD;  Location: [facility];  Service: Gynecology;  Laterality: N/A;  . DILATION AND CURETTAGE OF UTERUS    . HYSTEROSCOPY WITH NOVASURE N/A 03/25/2017   Procedure: HYSTEROSCOPY WITH NOVASURE;   Surgeon: Malachy Mood, MD;  Location: [facility];  Service: Gynecology;  Laterality: N/A;  . TUBAL LIGATION  2003   [facility]   Family History  Problem Relation Age of Onset  . High blood pressure Brother   . Diabetes Brother   . Kidney disease Brother   . Arthritis Brother   . Birth defects Brother   . Lung cancer Mother   . COPD Mother   . Hearing loss Father   . Heart disease Father   . Hypertension Father   . Heart disease Sister   . Hypertension Sister   . Depression Daughter    Social History   Socioeconomic History  . Marital status: Single    Spouse name: Not on file  . Number of children: 3  . Years of education: Not on file  . Highest education level: Not on file  Occupational History    Comment: [employer]  Social Needs  . Financial resource strain: Not on file  . Food insecurity:    Worry: Not on file    Inability: Not on file  . Transportation needs:    Medical: Not on file    Non-medical: Not on file  Tobacco Use  . Smoking status: Current Every Day Smoker    Packs/day: 1.00    Years: 30.00    Pack years: 30.00    Types: Cigarettes  . Smokeless  tobacco: Never Used  Substance and Sexual Activity  . Alcohol use: Yes    Comment: social - once every 2-3 months   . Drug use: No  . Sexual activity: Yes    Birth control/protection: Surgical  Lifestyle  . Physical activity:    Days per week: Not on file    Minutes per session: Not on file  . Stress: Not on file  Relationships  . Social connections:    Talks on phone: Not on file    Gets together: Not on file    Attends religious service: Not on file    Active member of club or organization: Not on file    Attends meetings of clubs or organizations: Not on file    Relationship status: Not on file  . Intimate partner violence:    Fear of current or ex partner: Not on file    Emotionally abused: Not on file    Physically abused: Not on file    Forced sexual activity: Not on file    Other Topics Concern  . Not on file  Social History Narrative   Patient is single and lives at home with her daughter. Patient works at [employer]. Patient has three children.   Right handed.    Caffeine -5   3 kids ages 76 girl, 79 boy 57 girl    Some college    Surveyor, mining at [employer]  Medication Sig  . atorvastatin (LIPITOR) 20 MG tablet Take 20 mg by mouth daily.  . cetirizine (ZYRTEC) 10 MG tablet Take 10 mg by mouth daily as needed for allergies.  . clotrimazole (MYCELEX) 10 MG troche Take 1 tablet (10 mg total) by mouth 5 (five) times daily.  Marland Kitchen docusate sodium (COLACE) 100 MG capsule Take 1 capsule (100 mg total) by mouth daily as needed for mild constipation. Do not take if you have loose stools  . ELIQUIS 5 MG TABS tablet Take 5 mg by mouth daily.  . ferrous sulfate 325 (65 FE) MG EC tablet Take 1 tablet (325 mg total) by mouth 2 (two) times daily with a meal.  . hydroxychloroquine (PLAQUENIL) 200 MG tablet Take 200 mg by mouth 2 (two) times daily.   No Known Allergies Recent Results (from the past 2160 hour(s))  CBC with Differential/Platelet     Status: Abnormal   Collection Time: 03/24/18 12:14 PM  Result Value Ref Range   WBC 3.8 3.6 - 11.0 K/uL   RBC 3.85 3.80 - 5.20 MIL/uL   Hemoglobin 12.5 12.0 - 16.0 g/dL   HCT 36.3 35.0 - 47.0 %   MCV 94.4 80.0 - 100.0 fL   MCH 32.4 26.0 - 34.0 pg   MCHC 34.3 32.0 - 36.0 g/dL   RDW 15.1 (H) 11.5 - 14.5 %   Platelets 232 150 - 440 K/uL   Neutrophils Relative % 60 %   Neutro Abs 2.3 1.4 - 6.5 K/uL   Lymphocytes Relative 28 %   Lymphs Abs 1.1 1.0 - 3.6 K/uL   Monocytes Relative 10 %   Monocytes Absolute 0.4 0.2 - 0.9 K/uL   Eosinophils Relative 2 %   Eosinophils Absolute 0.1 0 - 0.7 K/uL   Basophils Relative 0 %   Basophils Absolute 0.0 0 - 0.1 K/uL    Comment: Performed at [facility], [address] [place], [place] [address]  Iron and TIBC     Status: None   Collection Time: 03/24/18  12:14 PM  Result Value Ref Range   Iron 37 28 - 170 ug/dL   TIBC 315 250 - 450 ug/dL   Saturation Ratios 12 10.4 - 31.8 %   UIBC 278 ug/dL    Comment: Performed at [facility], [address] [place], [place] [address]  Ferritin     Status: None   Collection Time: 03/24/18 12:14 PM  Result Value Ref Range   Ferritin 12 11 - 307 ng/mL    Comment: Performed at [facility], [address] [place], [place] [address]  Prothrombin gene mutation     Status: None   Collection Time: 03/24/18 12:14 PM  Result Value Ref Range   Recommendations-PTGENE: Comment     Comment: (NOTE) NEGATIVE No mutation identified. Comment: A point mutation ([id number]) in the factor II (prothrombin) gene is the second most common cause of inherited thrombophilia. The incidence of this mutation in the [place] Caucasian population is about 2% and in the [place] American population it is approximately 0.5%. This mutation is rare in the [place] and Native American population. Being heterozygous for a prothrombin mutation increases the risk for developing venous thrombosis about 2 to 3 times above the general population risk. Being homozygous for the prothrombin gene mutation increases the relative risk for venous thrombosis further, although it is not yet known how much further the risk is increased. In women heterozygous for the prothrombin gene mutation, the use of estrogen containing oral contraceptives increases the relative risk of venous thrombosis about 16 times and the risk of developing cerebral thrombosis is also significantly increased. In pregnancy the pr othrombin gene mutation increases risk for venous thrombosis and may increase risk for stillbirth, placental abruption, pre-eclampsia and fetal growth restriction. If the patient possesses two or more congenital or acquired thrombophilic risk factors, the risk for thrombosis may rise to more than the sum of the risk ratios for  the individual mutations. This assay detects only the prothrombin G20210A mutation and does not measure genetic abnormalities elsewhere in the genome. Other thrombotic risk factors may be pursued through systematic clinical laboratory analysis. These factors include the R506Q ([place]) mutation in the Factor V gene, plasma homocysteine levels, as well as testing for deficiencies of antithrombin III, protein C and protein S. Genetic Counselors are available for health care providers to discuss results at [phone] [phone]). Methodology: DNA analysis of the Factor II gene was performed by PCR amplification followed by restriction analysis. The di agnostic sensitivity is >99% for both. All the tests must be combined with clinical information for the most accurate interpretation. Molecular-based testing is highly accurate, but as in any laboratory test, diagnostic errors may occur. This test was developed and its performance characteristics determined by [facility]. It has not been cleared or approved by the Food and Drug Administration. Poort SR, et al. Blood. 1996; 82:5053-9767. Varga EA. Circulation. 2004; 341:P37-T02. Mervin Hack, et [place]; 19:700-703. Allison Quarry, PhD, [facility] Ruben Reason, PhD, [facility] Annetta Maw, M.S., PhD, [facility] Alfredo Bach, PhD, [facility] Norva Riffle, PhD, [facility] Earlean Polka, PhD, [facility] Performed At: [facility] [address] [place], [place] [id number] Nechama Guard MD [id number] Performed at [facility], [address] [place], [place] [address]    Objective  Body mass index is 42.8 kg/m. Wt Readings from Last 3 Encounters:  04/26/18 241 lb 9.6 oz (109.6 kg)  03/24/18 235 lb (106.6 kg)  03/20/18 235 lb 12.8 oz (107 kg)  Temp Readings from Last 3 Encounters:  04/26/18 98.4 F (36.9 C) (Oral)  03/24/18 98.4 F (36.9 C) (Tympanic)  03/20/18 98.5 F (36.9 C) (Oral)   BP  Readings from Last 3 Encounters:  04/26/18 100/70  03/24/18 112/74  03/20/18 112/62   Pulse Readings from Last 3 Encounters:  04/26/18 80  03/24/18 95  03/20/18 84    Physical Exam  Constitutional: She is oriented to person, place, and time. Vital signs are normal. She appears well-developed and well-nourished. She is cooperative.  HENT:  Head: Normocephalic and atraumatic.  Mouth/Throat: Oropharynx is clear and moist.  Eyes: Pupils are equal, round, and reactive to light. Conjunctivae are normal.  Cardiovascular: Normal rate, regular rhythm and normal heart sounds.  Pulmonary/Chest: Effort normal and breath sounds normal.  Musculoskeletal:       Thoracic back: She exhibits bony tenderness.       Back:  Neurological: She is alert and oriented to person, place, and time. Gait normal.  Skin: Skin is warm, dry and intact.  Psychiatric: She has a normal mood and affect. Her speech is normal and behavior is normal. Judgment and thought content normal. Cognition and memory are normal.  Nursing note and vitals reviewed.   Assessment   1. MNG and abnormal thyroid labs  2. irregluar menses and iron def anemia  3. 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 DVT/PE on chronic anticoagulation  5. Right mid back pain/muscle spasm  6. Vit D def 8.4 03/2018  7. HM 8. HLD Plan   1. F/u Dr. Gabriel Carina in 6 months repeat US and get copy of bx thyroid  Will need to repeat thyroid labs see below  Reviewed note 04/07/18 Dr. Gabriel Carina checked TSH, free T4 rec FNA and repeat US in 6-12 months if FNA is benign free T4 low 0.53 normal 0.66-1.14, TSH 3.388 nl 0.450-5.330  -need to get records of pathology  2. Refer to Dr. Glennon Mac [facility]  rec ferrous sulfate 325 mg qd to bid with colace  3. F/u rheum  Pending eye appt 05/01/18  4. Chronic anticoagulation  5. Prn Flexeril qhs prn, heat  6.50K IU weekly x 6 months then 5000 IU qd D3  7.  Ask about flu shot at f/u  Per pt Tdap 2018  mammo had 04/13/18 neg  fibrous breast  Pap referred to [facility] last 03/10/12   Reviewed labs 03/30/18 CMET nl  TC 177, TG 98, HDL 29, LDL 128  TSH 2.410, T3 low 19, free T4 low 0.72  Anti TPO abx neg  CBC nl  UA trace blood  Iron sat 14 low  A1C 5.7  D3 8.4 low 8. Change lipitor 20 qhs to pravastatin 40 mg qhs   [facility] 04/24/18 negative check for use of hydroxychloroquine rec cont use. Dr. Benay Pillow  Provider: Dr. Olivia Mackie [facility]

## 2018-04-27 ENCOUNTER — Telehealth: Payer: Self-pay | Admitting: Obstetrics and Gynecology

## 2018-04-27 NOTE — Telephone Encounter (Signed)
[facility] referring for Pap if due and f/u irregular heavy cycles with Dr. Glennon Mac. Called and left voicemail for patient to call back to be schedule

## 2018-05-01 DIAGNOSIS — Z79899 Other long term (current) drug therapy: Secondary | ICD-10-CM | POA: Diagnosis not present

## 2018-05-01 NOTE — Telephone Encounter (Signed)
Patient is schedule 05/10/18 with SDJ

## 2018-05-03 ENCOUNTER — Encounter: Payer: Self-pay | Admitting: Internal Medicine

## 2018-05-04 ENCOUNTER — Other Ambulatory Visit: Payer: Self-pay | Admitting: Internal Medicine

## 2018-05-04 ENCOUNTER — Telehealth: Payer: Self-pay | Admitting: Internal Medicine

## 2018-05-04 NOTE — Telephone Encounter (Signed)
shes ok with the tramadol and will call to schedule appointment

## 2018-05-04 NOTE — Telephone Encounter (Signed)
Call pt to see if she wants to come in for appt Friday 05/05/18 based on her symptoms to reevaluate  Does she want to try Tramadol for pain? 10 day supply?   [place]

## 2018-05-05 ENCOUNTER — Other Ambulatory Visit: Payer: Self-pay | Admitting: Internal Medicine

## 2018-05-05 ENCOUNTER — Telehealth: Payer: Self-pay | Admitting: Internal Medicine

## 2018-05-05 DIAGNOSIS — M546 Pain in thoracic spine: Secondary | ICD-10-CM

## 2018-05-05 MED ORDER — TRAMADOL HCL 50 MG PO TABS: 50.0000 mg | ORAL_TABLET | Freq: Two times a day (BID) | ORAL | 0 refills | Status: DC | PRN

## 2018-05-05 NOTE — Telephone Encounter (Signed)
ROI received from [place] on 05/05/2018.

## 2018-05-05 NOTE — Telephone Encounter (Signed)
Sent tramadol # 10 pills 5 day supply bid prn

## 2018-05-06 ENCOUNTER — Emergency Department
Admission: EM | Admit: 2018-05-06 | Discharge: 2018-05-06 | Disposition: A | Discharge status: Home / Self Care | Payer: BLUE CROSS/BLUE SHIELD | Attending: Emergency Medicine | Admitting: Emergency Medicine

## 2018-05-06 ENCOUNTER — Emergency Department: Payer: BLUE CROSS/BLUE SHIELD

## 2018-05-06 ENCOUNTER — Encounter: Payer: Self-pay | Admitting: Emergency Medicine

## 2018-05-06 ENCOUNTER — Other Ambulatory Visit: Payer: Self-pay

## 2018-05-06 DIAGNOSIS — M5414 Radiculopathy, thoracic region: Secondary | ICD-10-CM

## 2018-05-06 DIAGNOSIS — F17210 Nicotine dependence, cigarettes, uncomplicated: Secondary | ICD-10-CM | POA: Diagnosis not present

## 2018-05-06 DIAGNOSIS — K5730 Diverticulosis of large intestine without perforation or abscess without bleeding: Secondary | ICD-10-CM | POA: Diagnosis not present

## 2018-05-06 DIAGNOSIS — R1011 Right upper quadrant pain: Secondary | ICD-10-CM | POA: Insufficient documentation

## 2018-05-06 DIAGNOSIS — R0989 Other specified symptoms and signs involving the circulatory and respiratory systems: Secondary | ICD-10-CM | POA: Diagnosis not present

## 2018-05-06 DIAGNOSIS — R079 Chest pain, unspecified: Secondary | ICD-10-CM | POA: Diagnosis not present

## 2018-05-06 DIAGNOSIS — Z79899 Other long term (current) drug therapy: Secondary | ICD-10-CM | POA: Insufficient documentation

## 2018-05-06 LAB — COMPREHENSIVE METABOLIC PANEL
ALBUMIN: 3.8 g/dL (ref 3.5–5.0)
ALT: 16 U/L (ref 14–54)
AST: 26 U/L (ref 15–41)
Alkaline Phosphatase: 68 U/L (ref 38–126)
Anion gap: 8 (ref 5–15)
BUN: 15 mg/dL (ref 6–20)
CHLORIDE: 109 mmol/L (ref 101–111)
CO2: 21 mmol/L — ABNORMAL LOW (ref 22–32)
Calcium: 8.9 mg/dL (ref 8.9–10.3)
Creatinine, Ser: 0.92 mg/dL (ref 0.44–1.00)
GFR calc Af Amer: >60 mL/min (ref >60)
GFR calc non Af Amer: >60 mL/min (ref >60)
GLUCOSE: 124 mg/dL — AB (ref 65–99)
Potassium: 3.8 mmol/L (ref 3.5–5.1)
SODIUM: 138 mmol/L (ref 135–145)
Total Bilirubin: 0.3 mg/dL (ref 0.3–1.2)
Total Protein: 6.7 g/dL (ref 6.5–8.1)

## 2018-05-06 LAB — CBC
HEMATOCRIT: 39.2 % (ref 35.0–47.0)
Hemoglobin: 13.4 g/dL (ref 12.0–16.0)
MCH: 32.2 pg (ref 26.0–34.0)
MCHC: 34.2 g/dL (ref 32.0–36.0)
MCV: 94.1 fL (ref 80.0–100.0)
Platelets: 205 10*3/uL (ref 150–440)
RBC: 4.17 MIL/uL (ref 3.80–5.20)
RDW: 15.6 % — AB (ref 11.5–14.5)
WBC: 4.3 10*3/uL (ref 3.6–11.0)

## 2018-05-06 LAB — URINALYSIS, COMPLETE (UACMP) WITH MICROSCOPIC
BACTERIA UA: NONE SEEN
BILIRUBIN URINE: NEGATIVE
Glucose, UA: NEGATIVE mg/dL
Hgb urine dipstick: NEGATIVE
Ketones, ur: NEGATIVE mg/dL
Nitrite: NEGATIVE
PROTEIN: NEGATIVE mg/dL
SPECIFIC GRAVITY, URINE: 1.025 (ref 1.005–1.030)
pH: 6 (ref 5.0–8.0)

## 2018-05-06 LAB — POCT PREGNANCY, URINE: PREG TEST UR: NEGATIVE

## 2018-05-06 LAB — LIPASE, BLOOD: LIPASE: 41 U/L (ref 11–51)

## 2018-05-06 MED ORDER — HYDROMORPHONE HCL 1 MG/ML IJ SOLN
1.0000 mg | Freq: Once | INTRAMUSCULAR | Status: AC
  Administered 2018-05-06: 1 mg via INTRAVENOUS
  Filled 2018-05-06: qty 1

## 2018-05-06 MED ORDER — HYDROMORPHONE HCL 1 MG/ML IJ SOLN
INTRAMUSCULAR | Status: AC
  Administered 2018-05-06: 1 mg
  Filled 2018-05-06: qty 1

## 2018-05-06 MED ORDER — IOPAMIDOL (ISOVUE-370) INJECTION 76%
100.0000 mL | Freq: Once | INTRAVENOUS | Status: AC | PRN
  Administered 2018-05-06: 100 mL via INTRAVENOUS

## 2018-05-06 MED ORDER — HYDROMORPHONE HCL 1 MG/ML IJ SOLN: 1.0000 mg | Freq: Once | INTRAMUSCULAR | Status: DC

## 2018-05-06 MED ORDER — PREDNISONE 20 MG PO TABS: 40.0000 mg | ORAL_TABLET | Freq: Every day | ORAL | 0 refills | Status: DC

## 2018-05-06 MED ORDER — SODIUM CHLORIDE 0.9 % IV BOLUS
1000.0000 mL | Freq: Once | INTRAVENOUS | Status: AC
  Administered 2018-05-06: 1000 mL via INTRAVENOUS

## 2018-05-06 MED ORDER — GABAPENTIN 100 MG PO CAPS: 100.0000 mg | ORAL_CAPSULE | Freq: Three times a day (TID) | ORAL | 0 refills | Status: DC

## 2018-05-06 NOTE — ED Provider Notes (Signed)
[facility] Emergency Department Provider Note  ____________________________________________  Time seen: Approximately 2:02 PM  I have reviewed the triage vital signs and the nursing notes.   HISTORY  Chief Complaint Flank Pain    HPI Melinda Ross is a 47 y.o. female with a history of discoid lupus, pulmonary embolism, and hyperlipidemia who complains of right sided chest pain in the back near the lower ribs, radiating around to the front.  Not pleuritic, denies shortness of breath.  Worse with movement and change in position and turning.  No recent trauma.  Denies fever chills or cough.  Does feel like when she had a pulmonary embolism in the past.  She has been compliant with her Eliquis therapy as well as all her other medications.  Pain is constant, severe, no alleviating factors.  Present for the past  week, not improved despite NSAIDs, tramadol, Flexeril from primary care and heating pad and topical medicines..     Past Medical History:  Diagnosis Date  . Anemia   . Anxiety   . Attention deficit disorder (ADD)   . Collagen vascular disease (HCC)    lupus  . DVT of leg (deep venous thrombosis) ([place])   . Genital herpes 2015  . Hyperlipidemia   . Lupus ([place])    SEES DR Duard Brady KERNODLE  Discoid  . Mild depression ([place])   . Numbness   . Pulmonary emboli ([place])   . UTI (urinary tract infection)      Patient Active Problem List   Diagnosis Date Noted  . Hyperlipidemia 04/26/2018  . Vitamin D deficiency 04/26/2018  . Ovarian cyst, left 03/20/2018  . Irregular menses 03/20/2018  . Low back pain 03/20/2018  . Fibroid 03/20/2018  . Thyroid nodule 03/20/2018  . SLE (systemic lupus erythematosus) ([place]) 03/20/2018  . Tobacco abuse 03/20/2018  . DVT (deep venous thrombosis) ([place]) 01/09/2018  . Pulmonary emboli ([place]) 12/16/2017  . 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 Numbness      Past Surgical History:  Procedure Laterality Date  . ABLATION     2017  Dr. Livingston Diones   . CESAREAN SECTION  2003   [facility]  . DG TOES*L* Bilateral    2 bones removed in small toes 2002   . DILATATION & CURETTAGE/HYSTEROSCOPY WITH MYOSURE N/A 12/24/2016   Procedure: [place];  Surgeon: Malachy Mood, MD;  Location: [facility] ORS;  Service: Gynecology;  Laterality: N/A;  . DILATION AND CURETTAGE OF UTERUS    . HYSTEROSCOPY WITH NOVASURE N/A 03/25/2017   Procedure: HYSTEROSCOPY WITH NOVASURE;  Surgeon: Malachy Mood, MD;  Location: [facility] ORS;  Service: Gynecology;  Laterality: N/A;  . TUBAL LIGATION  2003   [facility]     Prior to Admission medications   Medication Sig Start Date End Date Taking? Authorizing Provider  cetirizine (ZYRTEC) 10 MG tablet Take 10 mg by mouth daily as needed for allergies.    [provider]  Cholecalciferol 50000 units capsule Take 1 capsule (50,000 Units total) by mouth once a week. 04/26/18   McLean-Scocuzza, Nino Glow, MD  clotrimazole (MYCELEX) 10 MG troche Take 1 tablet (10 mg total) by mouth 5 (five) times daily. 03/20/18   McLean-Scocuzza, Nino Glow, MD  cyclobenzaprine (FLEXERIL) 5 MG tablet Take 1 tablet (5 mg total) by mouth at bedtime as needed for muscle spasms. 04/26/18   McLean-Scocuzza, Nino Glow, MD  docusate sodium (COLACE) 100 MG capsule Take 1 capsule (100 mg total) by mouth daily as needed  for mild constipation. Do not take if you have loose stools 03/24/18   Earlie Server, MD  ELIQUIS 5 MG TABS tablet Take 5 mg by mouth daily. 03/20/18   [provider]  ferrous sulfate 325 (65 FE) MG EC tablet Take 1 tablet (325 mg total) by mouth 2 (two) times daily with a meal. 03/24/18   Earlie Server, MD  gabapentin (NEURONTIN) 100 MG capsule Take 1 capsule (100 mg total) by mouth 3 (three) times daily. 05/06/18 06/05/18  Carrie Mew, MD  hydroxychloroquine (PLAQUENIL) 200 MG tablet Take 200 mg by mouth 2 (two) times daily.    [provider]  pravastatin  (PRAVACHOL) 40 MG tablet Take 1 tablet (40 mg total) by mouth at bedtime. 04/26/18   McLean-Scocuzza, Nino Glow, MD  predniSONE (DELTASONE) 20 MG tablet Take 2 tablets (40 mg total) by mouth daily. 05/06/18   Carrie Mew, MD  traMADol (ULTRAM) 50 MG tablet Take 1 tablet (50 mg total) by mouth every 12 (twelve) hours as needed. 05/05/18   McLean-Scocuzza, Nino Glow, MD     Allergies Patient has no known allergies.   Family History  Problem Relation Age of Onset  . High blood pressure Brother   . Diabetes Brother   . Kidney disease Brother   . Arthritis Brother   . Birth defects Brother   . Lung cancer Mother   . COPD Mother   . Hearing loss Father   . Heart disease Father   . Hypertension Father   . Heart disease Sister   . Hypertension Sister   . Depression Daughter     Social History Social History   Tobacco Use  . Smoking status: Current Every Day Smoker    Packs/day: 1.00    Years: 30.00    Pack years: 30.00    Types: Cigarettes  . Smokeless tobacco: Never Used  Substance Use Topics  . Alcohol use: Yes    Comment: social - once every 2-3 months   . Drug use: No    Review of Systems  Constitutional:   No fever or chills.  ENT:   No sore throat. No rhinorrhea. Cardiovascular:   Positive as above chest pain without syncope. Respiratory:   No dyspnea or cough. Gastrointestinal:   Negative for abdominal pain, vomiting and diarrhea.  Musculoskeletal:   Negative for focal pain or swelling All other systems reviewed and are negative except as documented above in ROS and HPI.  ____________________________________________   PHYSICAL EXAM:  VITAL SIGNS: ED Triage Vitals  Enc Vitals Group     BP 05/06/18 1059 (!) 143/76     Pulse Rate 05/06/18 1059 (!) 103     Resp 05/06/18 1059 20     Temp 05/06/18 1059 98.1 F (36.7 C)     Temp Source 05/06/18 1059 Oral     SpO2 05/06/18 1059 98 %     Weight 05/06/18 1100 241 lb (109.3 kg)     Height 05/06/18 1100 5\' 3"   (1.6 m)     Head Circumference --      Peak Flow --      Pain Score 05/06/18 1100 10     Pain Loc --      Pain Edu? --      Excl. in [place]? --     Vital signs reviewed, nursing assessments reviewed.   Constitutional:   Alert and oriented.  Uncomfortable but nontoxic . Eyes:   Conjunctivae are normal. EOMI. PERRL. ENT  Head:   Normocephalic and atraumatic.      Nose:   No congestion/rhinnorhea.       Mouth/Throat:   MMM, no pharyngeal erythema. No peritonsillar mass.       Neck:   No meningismus. Full ROM.  No JVD Hematological/Lymphatic/Immunilogical:   No cervical lymphadenopathy. Cardiovascular:   RRR. Symmetric bilateral radial and DP pulses.  No murmurs.  Respiratory:   Normal respiratory effort without tachypnea/retractions. Breath sounds are clear and equal bilaterally. No wheezes/rales/rhonchi. Gastrointestinal:   Soft with right upper quadrant tenderness. Non distended. There is no CVA tenderness.  No rebound, rigidity, or guarding.  Musculoskeletal:   Normal range of motion in all extremities. No joint effusions.  No lower extremity tenderness.  No edema.  Posterior chest wall is tender to the touch in the indicated area, wrapping around to the front, tender along the way over the inferior ribs. Neurologic:   Normal speech and language.  Motor grossly intact. No acute focal neurologic deficits are appreciated.  Skin:    Skin is warm, dry and intact. No rash noted.  No petechiae, purpura, or bullae.  ____________________________________________    LABS (pertinent positives/negatives) (all labs ordered are listed, but only abnormal results are displayed) Labs Reviewed  COMPREHENSIVE METABOLIC PANEL - Abnormal; Notable for the following components:      Result Value   CO2 21 (*)    Glucose, Bld 124 (*)    All other components within normal limits  CBC - Abnormal; Notable for the following components:   RDW 15.6 (*)    All other components within normal limits   URINALYSIS, COMPLETE (UACMP) WITH MICROSCOPIC - Abnormal; Notable for the following components:   Color, Urine YELLOW (*)    APPearance HAZY (*)    Leukocytes, UA SMALL (*)    All other components within normal limits  LIPASE, BLOOD  POC URINE PREG, ED  POCT PREGNANCY, URINE   ____________________________________________   EKG Interpreted by me Sinus rhythm rate of 78, normal axis intervals QRS ST segments and T waves.   ____________________________________________    RADIOLOGY  Dg Chest 2 View  Result Date: 05/06/2018 CLINICAL DATA:  47 year old female with a history EXAM: CHEST - 2 VIEW COMPARISON:  12/16/2017, 02/03/2008 FINDINGS: Cardiomediastinal silhouette unchanged in size and contour. No pneumothorax or pleural effusion. No confluent airspace disease. Resolution of the opacity in the right suprahilar region since the prior plain film. No displaced fracture. IMPRESSION: Negative for acute cardiopulmonary disease. Electronically Signed   By: Corrie Mckusick D.O.   On: 05/06/2018 12:49   Ct Angio Chest Pe W And/or Wo Contrast  Result Date: 05/06/2018 CLINICAL DATA:  RIGHT-sided mid back pain radiating towards RIGHT breast for 1 week, worsening. Pain refractory to muscle relaxers. Pain increasing with movement. History of pulmonary embolism in January of 2019. EXAM: CT ANGIOGRAPHY CHEST CT ABDOMEN AND PELVIS WITH CONTRAST TECHNIQUE: Multidetector CT imaging of the chest was performed using the standard protocol during bolus administration of intravenous contrast. Multiplanar CT image reconstructions and MIPs were obtained to evaluate the vascular anatomy. Multidetector CT imaging of the abdomen and pelvis was performed using the standard protocol during bolus administration of intravenous contrast. CONTRAST:  136mL ISOVUE-370 IOPAMIDOL (ISOVUE-370) INJECTION 76% COMPARISON:  CTA chest dated 12/16/2017. CT abdomen dated 04/01/2017. FINDINGS: CTA CHEST FINDINGS Cardiovascular: There is  no pulmonary embolism identified within the main, lobar or segmental pulmonary arteries bilaterally. No thoracic aortic aneurysm or evidence of aortic dissection. Heart size is normal.  No pericardial effusion. Mediastinum/Nodes: No mass or enlarged lymph nodes within the mediastinum or perihilar regions. Thyroid gland appears normal. Esophagus appears normal. Trachea and central bronchi are unremarkable. Lungs/Pleura: Lungs are clear. No pleural effusion or pneumothorax. Minimal emphysematous change within the upper lobes. Musculoskeletal: Prominent disc-osteophytic bulge at the T7-T8 level, with probable mass effect on the thoracic cord. No acute or suspicious osseous finding. Review of the MIP images confirms the above findings. CT ABDOMEN and PELVIS FINDINGS Hepatobiliary: No focal liver abnormality is seen. No gallstones, gallbladder wall thickening, or biliary dilatation. Pancreas: Unremarkable. No pancreatic ductal dilatation or surrounding inflammatory changes. Spleen: Normal in size without focal abnormality. Adrenals/Urinary Tract: Adrenal glands appear normal. Kidneys are unremarkable without mass, stone or hydronephrosis. No perinephric inflammation. No ureteral or bladder calculi identified. Bladder appears normal. Stomach/Bowel: Bowel is normal in caliber. No bowel wall thickening or evidence of bowel wall inflammation seen. Scattered diverticulosis throughout the colon but no focal inflammatory change to suggest acute diverticulitis. Appendix is normal. Stomach appears normal. Vascular/Lymphatic: Mild aortic atherosclerosis. No enlarged lymph nodes seen within the abdomen or pelvis. Reproductive: Leiomyomatous uterus, including a probable large fibroid at the uterine fundus. No mass or free fluid within either adnexal region. Other: No free fluid or abscess collection seen. No free intraperitoneal air. Musculoskeletal: No acute or suspicious osseous finding. No significant degenerative change within  the lumbar spine. Review of the MIP images confirms the above findings. IMPRESSION: 1. No acute findings within the chest, abdomen or pelvis. No pulmonary embolism. No aortic aneurysm or evidence of aortic dissection. 2. Prominent disc-osteophytic bulge at the T7-T8 vertebral body level, causing at least moderate central canal stenosis and probable mass effect on the thoracic cord and/or exiting nerve roots. This is a possible source for patient's pain. Consider nonemergent MRI of the thoracic spine for further characterization. 3. Minimal emphysematous change within the lung apices. 4. Mild colonic diverticulosis without evidence of acute diverticulitis. 5. Mild aortic atherosclerosis. 6. Leiomyomatous uterus, largest fibroid measuring 4.7 cm on pelvic ultrasound of 12/15/2016. Electronically Signed   By: Franki Cabot M.D.   On: 05/06/2018 14:43   Ct Abdomen Pelvis W Contrast  Result Date: 05/06/2018 CLINICAL DATA:  RIGHT-sided mid back pain radiating towards RIGHT breast for 1 week, worsening. Pain refractory to muscle relaxers. Pain increasing with movement. History of pulmonary embolism in January of 2019. EXAM: CT ANGIOGRAPHY CHEST CT ABDOMEN AND PELVIS WITH CONTRAST TECHNIQUE: Multidetector CT imaging of the chest was performed using the standard protocol during bolus administration of intravenous contrast. Multiplanar CT image reconstructions and MIPs were obtained to evaluate the vascular anatomy. Multidetector CT imaging of the abdomen and pelvis was performed using the standard protocol during bolus administration of intravenous contrast. CONTRAST:  143mL ISOVUE-370 IOPAMIDOL (ISOVUE-370) INJECTION 76% COMPARISON:  CTA chest dated 12/16/2017. CT abdomen dated 04/01/2017. FINDINGS: CTA CHEST FINDINGS Cardiovascular: There is no pulmonary embolism identified within the main, lobar or segmental pulmonary arteries bilaterally. No thoracic aortic aneurysm or evidence of aortic dissection. Heart size is  normal. No pericardial effusion. Mediastinum/Nodes: No mass or enlarged lymph nodes within the mediastinum or perihilar regions. Thyroid gland appears normal. Esophagus appears normal. Trachea and central bronchi are unremarkable. Lungs/Pleura: Lungs are clear. No pleural effusion or pneumothorax. Minimal emphysematous change within the upper lobes. Musculoskeletal: Prominent disc-osteophytic bulge at the T7-T8 level, with probable mass effect on the thoracic cord. No acute or suspicious osseous finding. Review of the MIP images confirms the above findings. CT ABDOMEN  and PELVIS FINDINGS Hepatobiliary: No focal liver abnormality is seen. No gallstones, gallbladder wall thickening, or biliary dilatation. Pancreas: Unremarkable. No pancreatic ductal dilatation or surrounding inflammatory changes. Spleen: Normal in size without focal abnormality. Adrenals/Urinary Tract: Adrenal glands appear normal. Kidneys are unremarkable without mass, stone or hydronephrosis. No perinephric inflammation. No ureteral or bladder calculi identified. Bladder appears normal. Stomach/Bowel: Bowel is normal in caliber. No bowel wall thickening or evidence of bowel wall inflammation seen. Scattered diverticulosis throughout the colon but no focal inflammatory change to suggest acute diverticulitis. Appendix is normal. Stomach appears normal. Vascular/Lymphatic: Mild aortic atherosclerosis. No enlarged lymph nodes seen within the abdomen or pelvis. Reproductive: Leiomyomatous uterus, including a probable large fibroid at the uterine fundus. No mass or free fluid within either adnexal region. Other: No free fluid or abscess collection seen. No free intraperitoneal air. Musculoskeletal: No acute or suspicious osseous finding. No significant degenerative change within the lumbar spine. Review of the MIP images confirms the above findings. IMPRESSION: 1. No acute findings within the chest, abdomen or pelvis. No pulmonary embolism. No aortic  aneurysm or evidence of aortic dissection. 2. Prominent disc-osteophytic bulge at the T7-T8 vertebral body level, causing at least moderate central canal stenosis and probable mass effect on the thoracic cord and/or exiting nerve roots. This is a possible source for patient's pain. Consider nonemergent MRI of the thoracic spine for further characterization. 3. Minimal emphysematous change within the lung apices. 4. Mild colonic diverticulosis without evidence of acute diverticulitis. 5. Mild aortic atherosclerosis. 6. Leiomyomatous uterus, largest fibroid measuring 4.7 cm on pelvic ultrasound of 12/15/2016. Electronically Signed   By: Franki Cabot M.D.   On: 05/06/2018 14:43   US Abdomen Limited Ruq  Result Date: 05/06/2018 CLINICAL DATA:  Right upper quadrant pain EXAM: ULTRASOUND ABDOMEN LIMITED RIGHT UPPER QUADRANT COMPARISON:  04/01/2017 FINDINGS: Gallbladder: No gallstones or wall thickening visualized. No sonographic Murphy sign noted by sonographer. Common bile duct: Diameter: 2.7 mm Liver: No focal lesion identified. Within normal limits in parenchymal echogenicity. Portal vein is patent on color Doppler imaging with normal direction of blood flow towards the liver. IMPRESSION: 1. No acute findings.  Normal exam. Right upper please pick the correct US ABDOMEN LIMITED template. Electronically Signed   By: Kerby Moors M.D.   On: 05/06/2018 13:28    ____________________________________________   PROCEDURES Procedures  ____________________________________________  DIFFERENTIAL DIAGNOSIS   Musculoskeletal chest wall pain, pneumothorax, pneumonia, pulmonary embolism, liver disease, biliary disease  CLINICAL IMPRESSION / ASSESSMENT AND PLAN / ED COURSE  Pertinent labs & imaging results that were available during my care of the patient were reviewed by me and considered in my medical decision making (see chart for details).      Clinical Course as of May 07 1543  Sat May 06, 2018   1225 Presents with right lower chest pain wrapping around the side.  Been going on for several days, unlikely to be shingles.  Not consistent with PE, not pleuritic, and she is on her Eliquis.  Suspect pneumonia versus small pneumothorax versus gallbladder disease versus musculoskeletal chest wall pain.  Will obtain ultrasound and chest x-ray.  Labs are unremarkable.   [PS]  1401 Ultrasound right upper quadrant unremarkable.  Chest x-ray unremarkable.  Patient still having severe pain, 7/10 after Dilaudid and feels like it is getting worse again.  At this point with her refractory severe pain and still diagnostic uncertainty, I will obtain CT scans of the chest abdomen and pelvis to look for occult  pathology.   [PS]    Clinical Course User Index [PS] Carrie Mew, MD     ----------------------------------------- 3:43 PM on 05/06/2018 -----------------------------------------  CT reports reveal a thoracic radiculopathy causing her pain.  It is consistent with radicular pain given the dermatomal distribution.  I will start her on a short course of prednisone and gabapentin on top of the pain medicine she is already taking.  Referral to neurosurgery.  Results discussed with patient and she agrees with the plan.  ____________________________________________   FINAL CLINICAL IMPRESSION(S) / ED DIAGNOSES    Final diagnoses:  Chest pain, unspecified type  Thoracic radiculopathy     ED Discharge Orders        Ordered    predniSONE (DELTASONE) 20 MG tablet  Daily     05/06/18 1542    gabapentin (NEURONTIN) 100 MG capsule  3 times daily     05/06/18 1542      Portions of this note were generated with dragon dictation software. Dictation errors may occur despite best attempts at proofreading.    Carrie Mew, MD 05/06/18 1544

## 2018-05-06 NOTE — ED Notes (Addendum)
Pt c/o right-sided mid back pain right above bra strap that wraps around toward right breast x 1 week but getting worse. Pt states being placed on muscle relaxers by PCP with no improvement, even also taking tylenol and ibuprofen. Pt reports pain increases with movement and when she is walking. Denies injury.  Has hx PE but states that today's pain and symptoms do not feel as they did when dx in January. Pt rating pain 10/10 at this time. No increase in pain with deep breaths.

## 2018-05-06 NOTE — ED Triage Notes (Signed)
First Nurse Note>  C/O right mid back pain radiating toward right ribs x 1 week.  Has been seen by PCP for same and prescribed Tramadol, which has not helped.  Patient states pain is worse with movement.    States she has history of Lupus and PE.

## 2018-05-06 NOTE — ED Notes (Signed)
Returned from CT.

## 2018-05-06 NOTE — ED Triage Notes (Signed)
R flank pain x 1 week. Denies dysuria.

## 2018-05-06 NOTE — ED Notes (Signed)
ED Provider at bedside. 

## 2018-05-06 NOTE — ED Notes (Addendum)
Patient transported to X-ray and US 

## 2018-05-06 NOTE — ED Notes (Addendum)
Pt reports mid back pain around bra=strap line radiating around the front on the right side. Pt states she is currently taking Eliquis for hx of blood clots

## 2018-05-06 NOTE — ED Notes (Signed)
Patient transported to CT 

## 2018-05-06 NOTE — ED Notes (Signed)
Pt BP at this time is 88/56 EDP aware d/c placed on hold at this time.

## 2018-05-06 NOTE — ED Notes (Addendum)
Pt returned from XR and US

## 2018-05-08 ENCOUNTER — Encounter: Payer: Self-pay | Admitting: Internal Medicine

## 2018-05-09 ENCOUNTER — Other Ambulatory Visit: Payer: Self-pay | Admitting: Internal Medicine

## 2018-05-09 ENCOUNTER — Telehealth: Payer: Self-pay | Admitting: Internal Medicine

## 2018-05-09 DIAGNOSIS — G8929 Other chronic pain: Secondary | ICD-10-CM

## 2018-05-09 DIAGNOSIS — M4804 Spinal stenosis, thoracic region: Secondary | ICD-10-CM

## 2018-05-09 DIAGNOSIS — M546 Pain in thoracic spine: Principal | ICD-10-CM

## 2018-05-09 NOTE — Telephone Encounter (Signed)
I want to order an MRI of your mid back CT was abnormal  You may need to see neurosurgery or ortho not neurology   [place]

## 2018-05-10 ENCOUNTER — Ambulatory Visit (INDEPENDENT_AMBULATORY_CARE_PROVIDER_SITE_OTHER): Payer: BLUE CROSS/BLUE SHIELD | Admitting: Obstetrics and Gynecology

## 2018-05-10 ENCOUNTER — Encounter: Payer: Self-pay | Admitting: Obstetrics and Gynecology

## 2018-05-10 VITALS — BP 128/84 | HR 81 | Ht 63.0 in | Wt 240.0 lb

## 2018-05-10 DIAGNOSIS — N926 Irregular menstruation, unspecified: Secondary | ICD-10-CM

## 2018-05-10 NOTE — Progress Notes (Signed)
Obstetrics & Gynecology Office Visit    Chief Complaint  Patient presents with  . Referral    pap if needed & Irregular periods  Referral from Dr. Olivia Mackie McLean-Scocuzza at [facility]   History of Present Illness: 47 y.o. [phone] female who presents in referral from Dr. Terese Door at [facility] for the above. Her last pap smear was in 2013 and was normal, HPV negative.  She had an endometrial ablation on 03/25/2017 with Dr. Georgianne Fick.  She states that her bleeding after the ablation was just like before, as if she had never had the procedure done.  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  She has had no vaginal bleeding in 3 weeks. For this she is taking Eliquis 5mg  twice daily.  Her menses are irregular, sometimes coming three times per month.  The last three weeks she has had no menses.    Past Medical History:  Diagnosis Date  . Anemia   . Anxiety   . Attention deficit disorder (ADD)   . Collagen vascular disease (HCC)    lupus  . DVT of leg (deep venous thrombosis) ([facility])   . Genital herpes 2015  . Hyperlipidemia   . Lupus ([place])    SEES DR Duard Brady KERNODLE  Discoid  . Mild depression ([place])   . Numbness   . Pulmonary emboli ([place])   . UTI (urinary tract infection)     Past Surgical History:  Procedure Laterality Date  . ABLATION     2017 Dr. Livingston Diones   . CESAREAN SECTION  2003   [facility]  . DG TOES*L* Bilateral    2 bones removed in small toes 2002   . DILATATION & CURETTAGE/HYSTEROSCOPY WITH MYOSURE N/A 12/24/2016   Procedure: [place];  Surgeon: Malachy Mood, MD;  Location: [facility] ORS;  Service: Gynecology;  Laterality: N/A;  . DILATION AND CURETTAGE OF UTERUS    . HYSTEROSCOPY WITH NOVASURE N/A 03/25/2017   Procedure: HYSTEROSCOPY WITH NOVASURE;  Surgeon: Malachy Mood, MD;  Location: [facility] ORS;  Service: Gynecology;  Laterality: N/A;  . TUBAL  LIGATION  2003   [facility]    Gynecologic History: Patient's last menstrual period was 04/16/2018 (within weeks).  Obstetric History: [id number]  Family History  Problem Relation Age of Onset  . High blood pressure Brother   . Diabetes Brother   . Kidney disease Brother   . Arthritis Brother   . Birth defects Brother   . Lung cancer Mother   . COPD Mother   . Hearing loss Father   . Heart disease Father   . Hypertension Father   . Heart disease Sister   . Hypertension Sister   . Depression Daughter     Social History   Socioeconomic History  . Marital status: Single    Spouse name: Not on file  . Number of children: 3  . Years of education: Not on file  . Highest education level: Not on file  Occupational History    Comment: [employer]  Social Needs  . Financial resource strain: Not on file  . Food insecurity:    Worry: Not on file    Inability: Not on file  . Transportation needs:    Medical: Not on file    Non-medical: Not on file  Tobacco Use  . Smoking status: Current Every Day Smoker    Packs/day: 1.00    Years: 30.00  Pack years: 30.00    Types: Cigarettes  . Smokeless tobacco: Never Used  Substance and Sexual Activity  . Alcohol use: Yes    Comment: social - once every 2-3 months   . Drug use: No  . Sexual activity: Yes    Birth control/protection: Surgical  Lifestyle  . Physical activity:    Days per week: Not on file    Minutes per session: Not on file  . Stress: Not on file  Relationships  . Social connections:    Talks on phone: Not on file    Gets together: Not on file    Attends religious service: Not on file    Active member of club or organization: Not on file    Attends meetings of clubs or organizations: Not on file    Relationship status: Not on file  . Intimate partner violence:    Fear of current or ex partner: Not on file    Emotionally abused: Not on file    Physically abused: Not on file    Forced sexual activity:  Not on file  Other Topics Concern  . Not on file  Social History Narrative   Patient is single and lives at home with her daughter. Patient works at [employer]. Patient has three children.   Right handed.    Caffeine -5   3 kids ages 62 girl, 21 boy 22 girl    Some college    Surveyor, mining at [employer]: No Known Allergies  Prior to Admission medications   Medication Sig Start Date End Date Taking? Authorizing Provider  cetirizine (ZYRTEC) 10 MG tablet Take 10 mg by mouth daily as needed for allergies.   Yes [provider]  Cholecalciferol 50000 units capsule Take 1 capsule (50,000 Units total) by mouth once a week. 04/26/18  Yes McLean-Scocuzza, Nino Glow, MD  cyclobenzaprine (FLEXERIL) 5 MG tablet Take 1 tablet (5 mg total) by mouth at bedtime as needed for muscle spasms. 04/26/18  Yes McLean-Scocuzza, Nino Glow, MD  docusate sodium (COLACE) 100 MG capsule Take 1 capsule (100 mg total) by mouth daily as needed for mild constipation. Do not take if you have loose stools 03/24/18  Yes Earlie Server, MD  ELIQUIS 5 MG TABS tablet Take 5 mg by mouth daily. 03/20/18  Yes [provider]  gabapentin (NEURONTIN) 100 MG capsule Take 1 capsule (100 mg total) by mouth 3 (three) times daily. 05/06/18 06/05/18 Yes Carrie Mew, MD  hydroxychloroquine (PLAQUENIL) 200 MG tablet Take 200 mg by mouth 2 (two) times daily.   Yes [provider]  pravastatin (PRAVACHOL) 40 MG tablet Take 1 tablet (40 mg total) by mouth at bedtime. 04/26/18  Yes McLean-Scocuzza, Nino Glow, MD  predniSONE (DELTASONE) 20 MG tablet Take 2 tablets (40 mg total) by mouth daily. 05/06/18  Yes Carrie Mew, MD  traMADol (ULTRAM) 50 MG tablet Take 1 tablet (50 mg total) by mouth every 12 (twelve) hours as needed. 05/05/18  Yes McLean-Scocuzza, Nino Glow, MD  clotrimazole (MYCELEX) 10 MG troche Take 1 tablet (10 mg total) by mouth 5 (five) times daily. Patient not taking: Reported on 05/10/2018 03/20/18    McLean-Scocuzza, Nino Glow, MD  ferrous sulfate 325 (65 FE) MG EC tablet Take 1 tablet (325 mg total) by mouth 2 (two) times daily with a meal. Patient not taking: Reported on 05/10/2018 03/24/18   Earlie Server, MD    Review of Systems  Constitutional: Negative.   HENT:  Negative.   Eyes: Negative.   Respiratory: Negative.   Cardiovascular: Negative.   Gastrointestinal: Negative.   Genitourinary: Negative.   Musculoskeletal: Negative.   Skin: Negative.   Neurological: Negative.   Psychiatric/Behavioral: Negative.      Physical Exam BP 128/84 (BP Location: Left Arm, Patient Position: Sitting, Cuff Size: Large)   Pulse 81   Ht 5\' 3"  (1.6 m)   Wt 240 lb (108.9 kg)   LMP 04/16/2018 (Within Weeks) Comment: neg preg  SpO2 99%   BMI 42.51 kg/m  Patient's last menstrual period was 04/16/2018 (within weeks). Physical Exam  Constitutional: She is oriented to person, place, and time. She appears well-developed and well-nourished. No distress.  Genitourinary: Vagina normal. Pelvic exam was performed with patient supine. There is no rash, tenderness or lesion on the right labia. There is no rash, tenderness or lesion on the left labia. Right adnexum does not display mass, does not display tenderness and does not display fullness. Left adnexum does not display mass, does not display tenderness and does not display fullness. Cervix does not exhibit motion tenderness, lesion or polyp.   Uterus is enlarged, exhibiting a mass (irregular contour), irregular and mobile. Uterus is not tender.  HENT:  Head: Normocephalic and atraumatic.  Eyes: EOM are normal. No scleral icterus.  Neck: Normal range of motion. Neck supple.  Cardiovascular: Normal rate and regular rhythm.  Pulmonary/Chest: Effort normal and breath sounds normal. No respiratory distress. She has no wheezes. She has no rales.  Abdominal: Soft. Bowel sounds are normal. She exhibits no distension and no mass. There is no tenderness. There is no  rebound and no guarding.  Musculoskeletal: Normal range of motion. She exhibits no edema.  Neurological: She is alert and oriented to person, place, and time. No cranial nerve deficit.  Skin: Skin is warm and dry. No erythema.  Psychiatric: She has a normal mood and affect. Her behavior is normal. Judgment normal.   Female chaperone present for pelvic and breast  portions of the physical exam  Assessment: 47 y.o. [id number] female here for  1. Irregular menses      Plan: Problem List Items Addressed This Visit      Other   Irregular menses - Primary   Relevant Orders   IGP,CtNgTv,Apt HPV,rfx16/18,45   US PELVIS TRANSVANGINAL NON-OB (TV ONLY)     Discussed her menstrual history and prior treatments. She has exhausted all medical treatments and surgical apart from a hysterectomy.  She is very high risk for complications from surgery given her history of VTE.  Will obtain pelvic ultrasound.  She will consider her risk versus her current symptoms and decide whether she would like a hysterectomy. I think her symptoms will continue as they have been present for several years.  Will be very aggressive about preventing VTE, if she elects hysterectomy.  20 minutes spent in face to face discussion with > 50% spent in counseling,management, and coordination of care of her menorrhagia with irregular cycles.   Prentice Docker, MD 05/10/2018 5:59 PM     CC: McLean-Scocuzza, Nino Glow, MD [place], [place] [id number]

## 2018-05-12 ENCOUNTER — Encounter (INDEPENDENT_AMBULATORY_CARE_PROVIDER_SITE_OTHER): Payer: Self-pay

## 2018-05-15 ENCOUNTER — Encounter (INDEPENDENT_AMBULATORY_CARE_PROVIDER_SITE_OTHER): Payer: Self-pay

## 2018-05-17 LAB — IGP,CTNGTV,APT HPV,RFX16/18,45
Chlamydia, Nuc. Acid Amp: NEGATIVE
Gonococcus, Nuc. Acid Amp: NEGATIVE
HPV Aptima: NEGATIVE
PAP SMEAR COMMENT: 0
TRICH VAG BY NAA: POSITIVE — AB

## 2018-05-18 ENCOUNTER — Telehealth: Payer: Self-pay | Admitting: Obstetrics and Gynecology

## 2018-05-18 ENCOUNTER — Other Ambulatory Visit: Payer: Self-pay | Admitting: Obstetrics and Gynecology

## 2018-05-18 DIAGNOSIS — A5901 Trichomonal vulvovaginitis: Secondary | ICD-10-CM

## 2018-05-18 MED ORDER — METRONIDAZOLE 500 MG PO TABS: 2000.0000 mg | ORAL_TABLET | Freq: Once | ORAL | 0 refills | Status: AC

## 2018-05-18 NOTE — Telephone Encounter (Signed)
Left generic VM.  Need to discus +trich result on pap smear and send in abx for treatment.

## 2018-05-18 NOTE — Telephone Encounter (Signed)
Notified SDJ pt returned call.

## 2018-05-18 NOTE — Telephone Encounter (Signed)
Patient is returning missed call from Dr. Glennon Mac

## 2018-05-18 NOTE — Telephone Encounter (Signed)
Patient is aware SDJ in OR today

## 2018-05-18 NOTE — Telephone Encounter (Signed)
Discussed finding of trichomonas on pap smear and NAAT testing.  Will treat patient. Advised patient that partner needs to be treated, as well. All questions answered. Wills end Rx for flagyl per CDC recommendations.

## 2018-05-26 ENCOUNTER — Ambulatory Visit
Admission: RE | Admit: 2018-05-26 | Discharge: 2018-05-26 | Disposition: A | Discharge status: Home / Self Care | Payer: BLUE CROSS/BLUE SHIELD | Source: Ambulatory Visit | Attending: Internal Medicine | Admitting: Internal Medicine

## 2018-05-26 ENCOUNTER — Other Ambulatory Visit: Payer: Self-pay | Admitting: Internal Medicine

## 2018-05-26 DIAGNOSIS — G8929 Other chronic pain: Secondary | ICD-10-CM

## 2018-05-26 DIAGNOSIS — M546 Pain in thoracic spine: Secondary | ICD-10-CM

## 2018-05-26 DIAGNOSIS — M2578 Osteophyte, vertebrae: Secondary | ICD-10-CM | POA: Diagnosis not present

## 2018-05-26 DIAGNOSIS — M4804 Spinal stenosis, thoracic region: Secondary | ICD-10-CM | POA: Diagnosis not present

## 2018-05-26 DIAGNOSIS — M5124 Other intervertebral disc displacement, thoracic region: Secondary | ICD-10-CM | POA: Diagnosis not present

## 2018-05-26 MED ORDER — GADOBENATE DIMEGLUMINE 529 MG/ML IV SOLN
20.0000 mL | Freq: Once | INTRAVENOUS | Status: AC | PRN
  Administered 2018-05-26: 20 mL via INTRAVENOUS

## 2018-05-29 ENCOUNTER — Telehealth: Payer: Self-pay | Admitting: Internal Medicine

## 2018-05-29 NOTE — Telephone Encounter (Signed)
Was this taken care of ? Call the # I need more details what do they need?   [place]

## 2018-05-29 NOTE — Telephone Encounter (Unsigned)
Copied from [place] [id number]. Topic: General - Other >> May 29, 2018  9:29 AM Judyann Munson wrote: Reason for CRM: Barbaraann Rondo from [facility] is calling in for a new order for a MRI .  Best contact number [phone]

## 2018-05-29 NOTE — Telephone Encounter (Signed)
Please advise 

## 2018-05-30 NOTE — Telephone Encounter (Signed)
Change from MRI t spine Joylene Igo with Radiology Scheduling calling and states that her supervisor(Rodney) is needing the MRI Throasic needs to be changed to MRI Thorasic spine with and without contrast. Needs a paper order sent.  CB#: [phone] Morey Hummingbird) CB#: [phone] Barbaraann Rondo) Fax#: [phone] or [phone]

## 2018-05-30 NOTE — Telephone Encounter (Signed)
Please advise 

## 2018-05-31 NOTE — Telephone Encounter (Signed)
Orders was completed yesterday and pended for provider signature.

## 2018-06-07 ENCOUNTER — Other Ambulatory Visit (INDEPENDENT_AMBULATORY_CARE_PROVIDER_SITE_OTHER): Payer: Self-pay | Admitting: Vascular Surgery

## 2018-06-07 MED ORDER — ELIQUIS 5 MG PO TABS: 5.0000 mg | ORAL_TABLET | Freq: Every day | ORAL | 5 refills | Status: DC

## 2018-06-07 NOTE — Progress Notes (Signed)
el

## 2018-06-08 ENCOUNTER — Other Ambulatory Visit: Payer: BLUE CROSS/BLUE SHIELD

## 2018-06-08 ENCOUNTER — Ambulatory Visit: Payer: BLUE CROSS/BLUE SHIELD | Admitting: Obstetrics and Gynecology

## 2018-06-26 ENCOUNTER — Inpatient Hospital Stay: Payer: BLUE CROSS/BLUE SHIELD

## 2018-06-26 ENCOUNTER — Inpatient Hospital Stay: Payer: BLUE CROSS/BLUE SHIELD | Admitting: Oncology

## 2018-07-10 ENCOUNTER — Inpatient Hospital Stay: Payer: BLUE CROSS/BLUE SHIELD | Attending: Oncology

## 2018-07-10 ENCOUNTER — Inpatient Hospital Stay: Payer: BLUE CROSS/BLUE SHIELD | Admitting: Oncology

## 2018-07-10 ENCOUNTER — Encounter: Payer: Self-pay | Admitting: *Deleted

## 2018-08-28 ENCOUNTER — Ambulatory Visit: Payer: BLUE CROSS/BLUE SHIELD | Admitting: Internal Medicine

## 2018-09-28 ENCOUNTER — Telehealth: Payer: Self-pay | Admitting: Internal Medicine

## 2018-09-28 NOTE — Telephone Encounter (Signed)
Lab results   Liver kidneys normal  Cholesterol elevated  -TC 225, HDL 29 low, LDL 181 high and TG 74 -mail cholesterol handout  -I rec change pravastatin to lipitor 40 is she agreeable?  -and add zetia is she agreeable?   Thyroid labs overall normal  Blood cts normal   A1C 5.8 indicates prediabetes   Vitamin D low rec take over the counter (otc) D3 5000 IU daily    tMS

## 2018-09-29 NOTE — Telephone Encounter (Signed)
Patient advised of below , she is agreeable to changing to lipitor 40 mg ok with adding zetia. She thinks she may have been on this medication before and it made her menstrual cycle bad.  She wonders if this is a side effect?

## 2018-10-02 ENCOUNTER — Other Ambulatory Visit: Payer: Self-pay | Admitting: Internal Medicine

## 2018-10-02 DIAGNOSIS — E785 Hyperlipidemia, unspecified: Secondary | ICD-10-CM

## 2018-10-02 MED ORDER — EZETIMIBE 10 MG PO TABS: 10.0000 mg | ORAL_TABLET | Freq: Every day | ORAL | 3 refills | Status: DC

## 2018-10-02 MED ORDER — ATORVASTATIN CALCIUM 40 MG PO TABS
40.0000 mg | ORAL_TABLET | Freq: Every day | ORAL | 3 refills | Status: DC
Start: 2018-10-02 — End: 2020-08-06

## 2018-10-02 NOTE — Telephone Encounter (Signed)
Not aware of this as a side effect  Sent lipitor 40 and zetia 10  [place]

## 2018-10-03 NOTE — Telephone Encounter (Signed)
Patient advised of below and verbalized understanding.  

## 2018-10-04 ENCOUNTER — Telehealth: Payer: Self-pay | Admitting: Internal Medicine

## 2018-10-04 DIAGNOSIS — R946 Abnormal results of thyroid function studies: Secondary | ICD-10-CM

## 2018-10-04 NOTE — Telephone Encounter (Signed)
Labs thyroid Free T4 0.72 low but increased almost normal  Thyroid antibodies 10 normal  thyroglobulin antibody 2.4 elevated normal is 0.9 Free T3 3.1 normal   Please follow up with endocrine due to see 10/08/18 but before 04/08/2019   Fransisco Beau call to try to add on TSH [facility]  [place]

## 2018-10-06 NOTE — Telephone Encounter (Signed)
Unable to add lab . Blood has been tossed.

## 2019-01-06 DIAGNOSIS — S838X2A Sprain of other specified parts of left knee, initial encounter: Secondary | ICD-10-CM | POA: Diagnosis not present

## 2019-01-06 DIAGNOSIS — S838X1A Sprain of other specified parts of right knee, initial encounter: Secondary | ICD-10-CM | POA: Diagnosis not present

## 2019-01-09 DIAGNOSIS — S838X2D Sprain of other specified parts of left knee, subsequent encounter: Secondary | ICD-10-CM | POA: Diagnosis not present

## 2019-01-11 ENCOUNTER — Ambulatory Visit (INDEPENDENT_AMBULATORY_CARE_PROVIDER_SITE_OTHER): Payer: BLUE CROSS/BLUE SHIELD | Admitting: Vascular Surgery

## 2019-01-11 ENCOUNTER — Encounter (INDEPENDENT_AMBULATORY_CARE_PROVIDER_SITE_OTHER): Payer: BLUE CROSS/BLUE SHIELD

## 2019-01-16 DIAGNOSIS — S838X2D Sprain of other specified parts of left knee, subsequent encounter: Secondary | ICD-10-CM | POA: Diagnosis not present

## 2019-04-03 ENCOUNTER — Telehealth: Payer: Self-pay | Admitting: Internal Medicine

## 2019-04-03 NOTE — Telephone Encounter (Signed)
Copied from [place] [id number]. Topic: Quick Communication - See Telephone Encounter >> Apr 03, 2019 10:10 AM Robina Ade, Helene Kelp D wrote: CRM for notification. See Telephone encounter for: 04/03/19. Patient called and said that she had labs done yesterday and her A1c is 8.6 and glucose is 481. She needs to talk to Dr. Aundra Dubin about her results. Please call patient back, thanks.

## 2019-04-03 NOTE — Telephone Encounter (Signed)
Appointment has been scheduled.

## 2019-04-03 NOTE — Telephone Encounter (Signed)
She has diabetes   Please schedule virtual/telephone visit this week   TMs

## 2019-04-03 NOTE — Telephone Encounter (Signed)
Labs 04/02/2019  A1C 8.6 glucose 481  BUN 15, Creatinine 0.98   TSH 2.690 FT4 0.93  Vitamin B12 503  WBC 7.1/H/H 14.0/41.0 plts 197 MCV 98  Urine culture pending   Na 131 low, K 4.0 cl 98, CO2 18 low, Calcium 9.4, total protein 67. Albumin 4.1 total bili 0.2, Alk phos 101, AST 23 ALT 20

## 2019-04-04 ENCOUNTER — Encounter: Payer: Self-pay | Admitting: Internal Medicine

## 2019-04-04 ENCOUNTER — Ambulatory Visit (INDEPENDENT_AMBULATORY_CARE_PROVIDER_SITE_OTHER): Payer: BLUE CROSS/BLUE SHIELD | Admitting: Internal Medicine

## 2019-04-04 ENCOUNTER — Other Ambulatory Visit: Payer: Self-pay

## 2019-04-04 DIAGNOSIS — E1165 Type 2 diabetes mellitus with hyperglycemia: Secondary | ICD-10-CM | POA: Insufficient documentation

## 2019-04-04 DIAGNOSIS — E559 Vitamin D deficiency, unspecified: Secondary | ICD-10-CM

## 2019-04-04 MED ORDER — METFORMIN HCL ER 750 MG PO TB24: 750.0000 mg | ORAL_TABLET | Freq: Every day | ORAL | 3 refills | Status: DC

## 2019-04-04 MED ORDER — PEN NEEDLES 30G X 8 MM MISC: 1.0000 | Freq: Every day | 3 refills | Status: DC

## 2019-04-04 MED ORDER — INSULIN DEGLUDEC 100 UNIT/ML ~~LOC~~ SOPN: 10.0000 [IU] | PEN_INJECTOR | Freq: Every day | SUBCUTANEOUS | 2 refills | Status: DC

## 2019-04-04 MED ORDER — BLOOD GLUCOSE METER KIT: PACK | 0 refills | Status: DC

## 2019-04-04 MED ORDER — SITAGLIPTIN PHOSPHATE 100 MG PO TABS
100.0000 mg | ORAL_TABLET | Freq: Every day | ORAL | 3 refills | Status: DC
Start: 2019-04-04 — End: 2020-08-06

## 2019-04-04 NOTE — Patient Instructions (Addendum)
Zevia if you feel like you want a soda this is sugar free  Web MD or [facility] for healthy diet choices meal plan and you will need to see dietitian in the future    Coping With Diabetes Diabetes (type 1 diabetes mellitus or type 2 diabetes mellitus) is a condition in which the body does not have enough of a hormone called insulin, or the body does not respond properly to insulin. Normally, insulin allows sugars (glucose) to enter cells in the body. The cells use glucose for energy. With diabetes, extra glucose builds up in the blood instead of going into cells, which results in high blood glucose (hyperglycemia). How to manage lifestyle changes Managing diabetes includes medical treatments as well as lifestyle changes. If diabetes is not managed well, serious physical and emotional complications can occur. Taking good care of yourself means that you are responsible for:  Monitoring glucose regularly.  Eating a healthy diet.  Exercising regularly.  Meeting with health care providers.  Taking medicines as directed. Some people may feel a lot of stress about managing their diabetes. This is known as emotional distress, and it is very common. Living with diabetes can place you at risk for emotional distress, depression, or anxiety. These disorders can be confusing and can make diabetes management more difficult. How to recognize stress Emotional distress Symptoms of emotional distress include:  Anger about having a diagnosis of diabetes.  Fear or frustration about your diagnosis and the changes you need to make to manage the condition.  Being overly worried about the care that you need or the cost of the care you need.  Feeling like you caused your condition by doing something wrong.  Fear of unpredictable situations, like low or high blood glucose.  Feeling judged by your health care providers.  Feeling very alone with the disease.  Getting too tired or "burned out" with the  demands of daily care. Depression Having diabetes means that you are at a higher risk for depression. Having depression also means that you are at a higher risk for diabetes. Your health care provider may test (screen) you for symptoms of depression. It is important to recognize depression symptoms and to start treatment for it soon after it is diagnosed. The following are some symptoms of depression:  Loss of interest in things that you used to enjoy.  Trouble sleeping, or often waking up early and not being able to get back to sleep.  A change in appetite.  Feeling tired most of the day.  Feeling nervous and anxious.  Feeling guilty and worrying that you are a burden to others.  Feeling depressed more often than you do not feel that way.  Thoughts of hurting yourself or feeling that you want to die. If you have any of these symptoms for 2 weeks or longer, reach out to a health care provider. Where to find support   Ask your health care provider to recommend a therapist who understands both depression and diabetes.  Search for information and support from the American Diabetes Association: www.diabetes.org  Find a certified diabetes educator and make an appointment through [place] Diabetes Educators: www.diabeteseducator.org Follow these instructions at home: Managing emotional distress The following are some ways to manage emotional distress:  Talk with your health care provider or certified diabetes educator. Consider working with a counselor or therapist.  Learn as much as you can about diabetes and its treatment. Meet with a certified diabetes educator or take a  class to learn how to manage your condition.  Keep a journal of your thoughts and concerns.  Accept that some things are out of your control.  Talk with other people who have diabetes. It can help to talk with others about the emotional distress that you feel.  Find ways to manage stress that  work for you. These may include art or music therapy, exercise, meditation, and hobbies.  Seek support from spiritual leaders, family, and friends. General instructions  Follow your diabetes management plan.  Keep all follow-up visits as told by your health care provider. This is important. Get help right away if:  You have thoughts about hurting yourself or others. If you ever feel like you may hurt yourself or others, or have thoughts about taking your own life, get help right away. You can go to your nearest emergency department or call:  Your local emergency services (911 in the [place]  A suicide crisis helpline, such as the [facility] at 2151499335. This is open 24 hours a day. Summary  Diabetes (type 1 diabetes mellitus or type 2 diabetes mellitus) is a condition in which the body does not have enough of a hormone called insulin, or the body does not respond properly to insulin.  Living with diabetes puts you at risk for medical issues, and it also puts you at risk for emotional issues such as emotional distress, depression, and anxiety.  Recognizing the symptoms of emotional distress and depression may help you avoid problems with your diabetes control. It is important to start treatment for emotional distress and depression soon after they are diagnosed.  Having diabetes means that you are at a higher risk for depression. Ask your health care provider to recommend a therapist who understands both depression and diabetes.  If you experience symptoms of emotional distress or depression, it is important to discuss this with your health care provider, certified diabetes educator, or therapist. This information is not intended to replace advice given to you by your health care provider. Make sure you discuss any questions you have with your health care provider. Document Released: 03/24/2017 Document Revised: 03/24/2017 Document Reviewed:  03/24/2017 Elsevier Interactive Patient Education  2019 [place].  Diabetes Mellitus and Exercise Exercising regularly is important for your overall health, especially when you have diabetes (diabetes mellitus). Exercising is not only about losing weight. It has many other health benefits, such as increasing muscle strength and bone density and reducing body fat and stress. This leads to improved fitness, flexibility, and endurance, all of which result in better overall health. Exercise has additional benefits for people with diabetes, including:  Reducing appetite.  Helping to lower and control blood glucose.  Lowering blood pressure.  Helping to control amounts of fatty substances (lipids) in the blood, such as cholesterol and triglycerides.  Helping the body to respond better to insulin (improving insulin sensitivity).  Reducing how much insulin the body needs.  Decreasing the risk for heart disease by: ? Lowering cholesterol and triglyceride levels. ? Increasing the levels of good cholesterol. ? Lowering blood glucose levels. What is my activity plan? Your health care provider or certified diabetes educator can help you make a plan for the type and frequency of exercise (activity plan) that works for you. Make sure that you:  Do at least 150 minutes of moderate-intensity or vigorous-intensity exercise each week. This could be brisk walking, biking, or water aerobics. ? Do stretching and strength exercises, such as yoga or weightlifting, at  least 2 times a week. ? Spread out your activity over at least 3 days of the week.  Get some form of physical activity every day. ? Do not go more than 2 days in a row without some kind of physical activity. ? Avoid being inactive for more than 30 minutes at a time. Take frequent breaks to walk or stretch.  Choose a type of exercise or activity that you enjoy, and set realistic goals.  Start slowly, and gradually increase the intensity  of your exercise over time. What do I need to know about managing my diabetes?   Check your blood glucose before and after exercising. ? If your blood glucose is 240 mg/dL (13.3 mmol/L) or higher before you exercise, check your urine for ketones. If you have ketones in your urine, do not exercise until your blood glucose returns to normal. ? If your blood glucose is 100 mg/dL (5.6 mmol/L) or lower, eat a snack containing 15-20 grams of carbohydrate. Check your blood glucose 15 minutes after the snack to make sure that your level is above 100 mg/dL (5.6 mmol/L) before you start your exercise.  Know the symptoms of low blood glucose (hypoglycemia) and how to treat it. Your risk for hypoglycemia increases during and after exercise. Common symptoms of hypoglycemia can include: ? Hunger. ? Anxiety. ? Sweating and feeling clammy. ? Confusion. ? Dizziness or feeling light-headed. ? Increased heart rate or palpitations. ? Blurry vision. ? Tingling or numbness around the mouth, lips, or tongue. ? Tremors or shakes. ? Irritability.  Keep a rapid-acting carbohydrate snack available before, during, and after exercise to help prevent or treat hypoglycemia.  Avoid injecting insulin into areas of the body that are going to be exercised. For example, avoid injecting insulin into: ? The arms, when playing tennis. ? The legs, when jogging.  Keep records of your exercise habits. Doing this can help you and your health care provider adjust your diabetes management plan as needed. Write down: ? Food that you eat before and after you exercise. ? Blood glucose levels before and after you exercise. ? The type and amount of exercise you have done. ? When your insulin is expected to peak, if you use insulin. Avoid exercising at times when your insulin is peaking.  When you start a new exercise or activity, work with your health care provider to make sure the activity is safe for you, and to adjust your  insulin, medicines, or food intake as needed.  Drink plenty of water while you exercise to prevent dehydration or heat stroke. Drink enough fluid to keep your urine clear or pale yellow. Summary  Exercising regularly is important for your overall health, especially when you have diabetes (diabetes mellitus).  Exercising has many health benefits, such as increasing muscle strength and bone density and reducing body fat and stress.  Your health care provider or certified diabetes educator can help you make a plan for the type and frequency of exercise (activity plan) that works for you.  When you start a new exercise or activity, work with your health care provider to make sure the activity is safe for you, and to adjust your insulin, medicines, or food intake as needed. This information is not intended to replace advice given to you by your health care provider. Make sure you discuss any questions you have with your health care provider. Document Released: 01/29/2004 Document Revised: 05/19/2017 Document Reviewed: 04/19/2016 Elsevier Interactive Patient Education  2019 [employer].  Diabetes  Mellitus and Nutrition, Adult When you have diabetes (diabetes mellitus), it is very important to have healthy eating habits because your blood sugar (glucose) levels are greatly affected by what you eat and drink. Eating healthy foods in the appropriate amounts, at about the same times every day, can help you:  Control your blood glucose.  Lower your risk of heart disease.  Improve your blood pressure.  Reach or maintain a healthy weight. Every person with diabetes is different, and each person has different needs for a meal plan. Your health care provider may recommend that you work with a diet and nutrition specialist (dietitian) to make a meal plan that is best for you. Your meal plan may vary depending on factors such as:  The calories you need.  The medicines you take.  Your weight.  Your  blood glucose, blood pressure, and cholesterol levels.  Your activity level.  Other health conditions you have, such as heart or kidney disease. How do carbohydrates affect me? Carbohydrates, also called carbs, affect your blood glucose level more than any other type of food. Eating carbs naturally raises the amount of glucose in your blood. Carb counting is a method for keeping track of how many carbs you eat. Counting carbs is important to keep your blood glucose at a healthy level, especially if you use insulin or take certain oral diabetes medicines. It is important to know how many carbs you can safely have in each meal. This is different for every person. Your dietitian can help you calculate how many carbs you should have at each meal and for each snack. Foods that contain carbs include:  Bread, cereal, rice, pasta, and crackers.  Potatoes and corn.  Peas, beans, and lentils.  Milk and yogurt.  Fruit and juice.  Desserts, such as cakes, cookies, ice cream, and candy. How does alcohol affect me? Alcohol can cause a sudden decrease in blood glucose (hypoglycemia), especially if you use insulin or take certain oral diabetes medicines. Hypoglycemia can be a life-threatening condition. Symptoms of hypoglycemia (sleepiness, dizziness, and confusion) are similar to symptoms of having too much alcohol. If your health care provider says that alcohol is safe for you, follow these guidelines:  Limit alcohol intake to no more than 1 drink per day for nonpregnant women and 2 drinks per day for men. One drink equals 12 oz of beer, 5 oz of wine, or 1 oz of hard liquor.  Do not drink on an empty stomach.  Keep yourself hydrated with water, diet soda, or unsweetened iced tea.  Keep in mind that regular soda, juice, and other mixers may contain a lot of sugar and must be counted as carbs. What are tips for following this plan?  Reading food labels  Start by checking the serving size on the  "Nutrition Facts" label of packaged foods and drinks. The amount of calories, carbs, fats, and other nutrients listed on the label is based on one serving of the item. Many items contain more than one serving per package.  Check the total grams (g) of carbs in one serving. You can calculate the number of servings of carbs in one serving by dividing the total carbs by 15. For example, if a food has 30 g of total carbs, it would be equal to 2 servings of carbs.  Check the number of grams (g) of saturated and trans fats in one serving. Choose foods that have low or no amount of these fats.  Check the number of  milligrams (mg) of salt (sodium) in one serving. Most people should limit total sodium intake to less than 2,300 mg per day.  Always check the nutrition information of foods labeled as "low-fat" or "nonfat". These foods may be higher in added sugar or refined carbs and should be avoided.  Talk to your dietitian to identify your daily goals for nutrients listed on the label. Shopping  Avoid buying canned, premade, or processed foods. These foods tend to be high in fat, sodium, and added sugar.  Shop around the outside edge of the grocery store. This includes fresh fruits and vegetables, bulk grains, fresh meats, and fresh dairy. Cooking  Use low-heat cooking methods, such as baking, instead of high-heat cooking methods like deep frying.  Cook using healthy oils, such as olive, canola, or sunflower oil.  Avoid cooking with butter, cream, or high-fat meats. Meal planning  Eat meals and snacks regularly, preferably at the same times every day. Avoid going long periods of time without eating.  Eat foods high in fiber, such as fresh fruits, vegetables, beans, and whole grains. Talk to your dietitian about how many servings of carbs you can eat at each meal.  Eat 4-6 ounces (oz) of lean protein each day, such as lean meat, chicken, fish, eggs, or tofu. One oz of lean protein is equal  to: ? 1 oz of meat, chicken, or fish. ? 1 egg. ?  cup of tofu.  Eat some foods each day that contain healthy fats, such as avocado, nuts, seeds, and fish. Lifestyle  Check your blood glucose regularly.  Exercise regularly as told by your health care provider. This may include: ? 150 minutes of moderate-intensity or vigorous-intensity exercise each week. This could be brisk walking, biking, or water aerobics. ? Stretching and doing strength exercises, such as yoga or weightlifting, at least 2 times a week.  Take medicines as told by your health care provider.  Do not use any products that contain nicotine or tobacco, such as cigarettes and e-cigarettes. If you need help quitting, ask your health care provider.  Work with a Social worker or diabetes educator to identify strategies to manage stress and any emotional and social challenges. Questions to ask a health care provider  Do I need to meet with a diabetes educator?  Do I need to meet with a dietitian?  What number can I call if I have questions?  When are the best times to check my blood glucose? Where to find more information:  American Diabetes Association: diabetes.org  Academy of Nutrition and Dietetics: www.eatright.[employer] of Diabetes and Digestive and Kidney Diseases (NIH): [employer] Summary  A healthy meal plan will help you control your blood glucose and maintain a healthy lifestyle.  Working with a diet and nutrition specialist (dietitian) can help you make a meal plan that is best for you.  Keep in mind that carbohydrates (carbs) and alcohol have immediate effects on your blood glucose levels. It is important to count carbs and to use alcohol carefully. This information is not intended to replace advice given to you by your health care provider. Make sure you discuss any questions you have with your health care provider. Document Released: 08/05/2005 Document Revised: 06/08/2017 Document  Reviewed: 12/13/2016 Elsevier Interactive Patient Education  2019 [employer].

## 2019-04-04 NOTE — Progress Notes (Addendum)
Virtual Visit via Video Note  I connected with Melinda Ross   on 04/04/19 at  9:43 AM EDT by a video enabled telemedicine application and verified that I am speaking with the correct person using two identifiers.  Location patient: work  [employer] Persons participating in the virtual visit: patient, provider  I discussed the limitations of evaluation and management by telemedicine and the availability of in person appointments. The patient expressed understanding and agreed to proceed.   HPI: 1. Weds of last week not well and felt dehydrated with increased urination going every 30-40 minutes and 'Sunday not well and had increased hunger and blurry vision she saw MD live at work and labs done A1C 8.6 and cbg 481 elevated new diagnosis of diabetes. Tammy NP at work placed her on Metformin 750 mg qd with breakfast  She reports 2-3 weeks ago she did get into poison ivy/oak and on prednisone taper   ROS: See pertinent positives and negatives per HPI.  Past Medical History:  Diagnosis Date  . Anemia   . Anxiety   . Attention deficit disorder (ADD)   . Collagen vascular disease (HCC)    lupus  . DVT of leg (deep venous thrombosis) (HCC)   . Genital herpes 2015  . Hyperlipidemia   . 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 Mild depression (HCC)   . Numbness   . Pulmonary emboli (HCC)   . UTI (urinary tract infection)     Past Surgical History:  Procedure Laterality Date  . ABLATION     20' 17 Dr. Livingston Diones   . CESAREAN SECTION  2003   [facility]  . DG TOES*L* Bilateral    2 bones removed in small toes 2002   . DILATATION & CURETTAGE/HYSTEROSCOPY WITH MYOSURE N/A 12/24/2016   Procedure: [place];  Surgeon: Malachy Mood, MD;  Location: [facility];  Service: Gynecology;  Laterality: N/A;  . DILATION AND CURETTAGE OF UTERUS    . HYSTEROSCOPY WITH NOVASURE N/A 03/25/2017   Procedure: HYSTEROSCOPY WITH NOVASURE;   Surgeon: Malachy Mood, MD;  Location: [facility];  Service: Gynecology;  Laterality: N/A;  . TUBAL LIGATION  2003   [facility]    Family History  Problem Relation Age of Onset  . High blood pressure Brother   . Diabetes Brother   . Kidney disease Brother   . Arthritis Brother   . Birth defects Brother   . Lung cancer Mother   . COPD Mother   . Hearing loss Father   . Heart disease Father   . Hypertension Father   . Heart disease Sister   . Hypertension Sister   . Depression Daughter     SOCIAL HX: single with kids working currently    Current Outpatient Medications:  .  atorvastatin (LIPITOR) 40 MG tablet, Take 1 tablet (40 mg total) by mouth daily., Disp: 90 tablet, Rfl: 3 .  cetirizine (ZYRTEC) 10 MG tablet, Take 10 mg by mouth daily as needed for allergies., Disp: , Rfl:  .  Cholecalciferol 50000 units capsule, Take 1 capsule (50,000 Units total) by mouth once a week., Disp: 13 capsule, Rfl: 1 .  clotrimazole (MYCELEX) 10 MG troche, Take 1 tablet (10 mg total) by mouth 5 (five) times daily., Disp: 35 tablet, Rfl: 0 .  cyclobenzaprine (FLEXERIL) 5 MG tablet, Take 1 tablet (5 mg total) by mouth at bedtime as needed for muscle spasms., Disp: 90 tablet, Rfl: 0 .  docusate sodium (COLACE) 100 MG capsule, Take 1 capsule (100 mg total) by mouth daily as needed for mild constipation. Do not take if you have loose stools, Disp: 30 capsule, Rfl: 2 .  ELIQUIS 5 MG TABS tablet, Take 1 tablet (5 mg total) by mouth daily., Disp: 60 tablet, Rfl: 5 .  ezetimibe (ZETIA) 10 MG tablet, Take 1 tablet (10 mg total) by mouth daily., Disp: 90 tablet, Rfl: 3 .  ferrous sulfate 325 (65 FE) MG EC tablet, Take 1 tablet (325 mg total) by mouth 2 (two) times daily with a meal., Disp: 30 tablet, Rfl: 3 .  hydroxychloroquine (PLAQUENIL) 200 MG tablet, Take 200 mg by mouth 2 (two) times daily., Disp: , Rfl:  .  predniSONE (DELTASONE) 20 MG tablet, Take 2 tablets (40 mg total) by mouth daily., Disp:  8 tablet, Rfl: 0 .  traMADol (ULTRAM) 50 MG tablet, Take 1 tablet (50 mg total) by mouth every 12 (twelve) hours as needed., Disp: 10 tablet, Rfl: 0 .  blood glucose meter kit and supplies, Dispense based on patient and insurance preference. Use up to four times daily as directed. (FOR ICD-10 E10.9, E11.9)., Disp: 1 each, Rfl: 0 .  gabapentin (NEURONTIN) 100 MG capsule, Take 1 capsule (100 mg total) by mouth 3 (three) times daily., Disp: 90 capsule, Rfl: 0 .  insulin degludec (TRESIBA FLEXTOUCH) 100 UNIT/ML SOPN FlexTouch Pen, Inject 0.1 mLs (10 Units total) into the skin daily. With breakfast, Disp: 5 pen, Rfl: 2 .  Insulin Pen Needle (PEN NEEDLES) 30G X 8 MM MISC, 1 Device by Does not apply route daily with breakfast., Disp: 90 each, Rfl: 3 .  metFORMIN (GLUCOPHAGE-XR) 750 MG 24 hr tablet, Take 1 tablet (750 mg total) by mouth daily with breakfast., Disp: 90 tablet, Rfl: 3 .  sitaGLIPtin (JANUVIA) 100 MG tablet, Take 1 tablet (100 mg total) by mouth daily., Disp: 90 tablet, Rfl: 3  EXAM:  VITALS per patient if applicable:  GENERAL: alert, oriented, appears well and in no acute distress  HEENT: atraumatic, conjunttiva clear, no obvious abnormalities on inspection of external nose and ears  NECK: normal movements of the head and neck  LUNGS: on inspection no signs of respiratory distress, breathing rate appears normal, no obvious gross SOB, gasping or wheezing  CV: no obvious cyanosis  MS: moves all visible extremities without noticeable abnormality  PSYCH/NEURO: pleasant and cooperative, no obvious depression or anxiety, speech and thought processing grossly intact  ASSESSMENT AND PLAN:  Discussed the following assessment and plan:  Type 2 diabetes mellitus with hyperglycemia, without long-term current use of insulin (HCC) - Plan: blood glucose meter kit and supplies, sitaGLIPtin (JANUVIA) 100 MG tablet, metFORMIN (GLUCOPHAGE-XR) 750 MG 24 hr tablet, insulin degludec (TRESIBA  FLEXTOUCH) 100 UNIT/ML SOPN FlexTouch Pen, Insulin Pen Needle (PEN NEEDLES) 30G X 8 MM MISC  -will start tresiba 10 units daily with Januvia 100 mg qd with metformin 750 mg xr  -Rx meter to check 3x per day before breakfast, after lunch and before bed  -est with eye MD no appts yet canceled 2/2 COVID est [facility]  -will refer to dietitian in future  -disc increased water intake and healthy diet choices  -will need to do foot exam in the future  -will need to check urine in future  -note for work to be off 5/13 and 5/14 return to work 04/09/2019 with blurry vision currently and operates around heavy machinery   HM Ask about flu shot at f/u  Per pt Tdap 2018  Consider pna 23 vx  mammo had 04/13/18 neg fibrous breast [facility]  -will need repeat mammogram in future  Pap Dr Glennon Mac 05/10/18 neg pap neg HPV + trichomonas  Colonoscopy due age 87 y.o    [facility] 04/24/18 negative check for use of hydroxychloroquine rec cont use. Dr. Benay Pillow   09/11/19 labs scanned in labs   Liver kidneys normal  Iron normal  Tc 202, tgs 94, hdl 27, ldl 158 high  -is she taking lipitor 40 mg at night?   TSH elevated 4.940 she needs to call endocrine and schedule f/u  Solum, Felipa Evener, MD  [place]  [facility] [place], [place] [address]  [phone]  [phone] (Fax)   Blood cts normal  A1C 5.8 improved  Vitamin D 14.8 low sent D3 to pharmacy 1x per week  ESR normal 28 sed rate  B12 396 normal  cRP 5 normal    I discussed the assessment and treatment plan with the patient. The patient was provided an opportunity to ask questions and all were answered. The patient agreed with the plan and demonstrated an understanding of the instructions.   The patient was advised to call back or seek an in-person evaluation if the symptoms worsen or if the condition fails to improve as anticipated.  Time spent 25 minutes  Delorise Jackson, MD

## 2019-04-04 NOTE — Progress Notes (Signed)
Pre visit review using our clinic review tool, if applicable. No additional management support is needed unless otherwise documented below in the visit note. 

## 2019-04-06 ENCOUNTER — Other Ambulatory Visit: Payer: Self-pay | Admitting: Internal Medicine

## 2019-04-06 ENCOUNTER — Encounter: Payer: Self-pay | Admitting: Internal Medicine

## 2019-04-06 ENCOUNTER — Telehealth: Payer: Self-pay | Admitting: Internal Medicine

## 2019-04-06 DIAGNOSIS — Z79899 Other long term (current) drug therapy: Secondary | ICD-10-CM

## 2019-04-06 DIAGNOSIS — H538 Other visual disturbances: Secondary | ICD-10-CM

## 2019-04-06 DIAGNOSIS — E1165 Type 2 diabetes mellitus with hyperglycemia: Secondary | ICD-10-CM

## 2019-04-06 DIAGNOSIS — Z794 Long term (current) use of insulin: Secondary | ICD-10-CM

## 2019-04-06 NOTE — Telephone Encounter (Signed)
Spoken to patient. She is ok with referral, and she wants to know does she go back to work on [employer]? Or stay out?  You can send Mychart with answer.

## 2019-04-06 NOTE — Telephone Encounter (Signed)
Copied from [place] [phone]. Topic: General - Other >> Apr 06, 2019  8:58 AM Lennox Solders wrote: Reason for CRM: pt is calling to let dr mclean-scocuzza know since she starting taking insulin  and januvia on Wednesday also Pt starting taking metformin on Tuesday night 04-03-2019 Pt bs  this morning fasting is 301. That the lowest it has been since 04-04-2019. Pt is still having blurry vision and pt is urinating less.  please advice

## 2019-04-06 NOTE — Telephone Encounter (Signed)
This means her blood sugars are getting better if peeing less  Does she want me to schedule an appt with her eye doctor for blurry vision?   Increase Tresiba to 15 units   Call back Monday   [place]

## 2019-04-09 ENCOUNTER — Telehealth: Payer: Self-pay

## 2019-04-09 ENCOUNTER — Other Ambulatory Visit: Payer: Self-pay | Admitting: Internal Medicine

## 2019-04-09 ENCOUNTER — Encounter: Payer: Self-pay | Admitting: Internal Medicine

## 2019-04-09 DIAGNOSIS — E1165 Type 2 diabetes mellitus with hyperglycemia: Secondary | ICD-10-CM

## 2019-04-09 MED ORDER — INSULIN DEGLUDEC 100 UNIT/ML ~~LOC~~ SOPN: 20.0000 [IU] | PEN_INJECTOR | Freq: Every day | SUBCUTANEOUS | 2 refills | Status: DC

## 2019-04-09 NOTE — Telephone Encounter (Signed)
Pt states that she miss a call regarding and would like a call back today if possible.

## 2019-04-09 NOTE — Telephone Encounter (Signed)
New letter is in there with fax # in th letter please fax to her job today  Inform patient she can go to work 04/10/2019 but be expecting an eye doc appt to be scheduled soon  I want her to take [place] 20 units and call back in a couple of days with her blood sugar reading fasting in the am Does she want me to put a nutritionist referral now?   She should only take metformin and Januvia 1 x per day  [place]

## 2019-04-09 NOTE — Telephone Encounter (Signed)
Copied from [place] [id number]. Topic: General - Other >> Apr 09, 2019 12:13 PM Sheran Luz wrote: Reason for CRM: Patient requesting call from [facility] regarding mychart message from 5/18.

## 2019-04-10 ENCOUNTER — Other Ambulatory Visit: Payer: Self-pay | Admitting: Internal Medicine

## 2019-04-10 DIAGNOSIS — E1165 Type 2 diabetes mellitus with hyperglycemia: Secondary | ICD-10-CM

## 2019-04-10 DIAGNOSIS — Z794 Long term (current) use of insulin: Secondary | ICD-10-CM

## 2019-04-10 NOTE — Telephone Encounter (Signed)
Spoke with patient and patient stated she missed the call to go back to work today she works 12 hours and will go back on Friday I informed her that if the note needs to resent with a new date to let us know.  Patient also states she does want a referral for a nutritionist.  And she will call back with her BS readings sono.  Melinda Ross,cma

## 2019-04-11 ENCOUNTER — Encounter: Payer: Self-pay | Admitting: Internal Medicine

## 2019-04-13 ENCOUNTER — Other Ambulatory Visit: Payer: Self-pay | Admitting: Internal Medicine

## 2019-04-13 DIAGNOSIS — E1165 Type 2 diabetes mellitus with hyperglycemia: Secondary | ICD-10-CM

## 2019-04-13 MED ORDER — INSULIN DEGLUDEC 100 UNIT/ML ~~LOC~~ SOPN: 25.0000 [IU] | PEN_INJECTOR | Freq: Every day | SUBCUTANEOUS | 2 refills | Status: DC

## 2019-04-20 ENCOUNTER — Other Ambulatory Visit: Payer: Self-pay

## 2019-04-20 ENCOUNTER — Encounter: Payer: Self-pay | Admitting: *Deleted

## 2019-04-20 ENCOUNTER — Encounter: Payer: BLUE CROSS/BLUE SHIELD | Attending: Internal Medicine | Admitting: *Deleted

## 2019-04-20 VITALS — BP 110/80 | Ht 63.0 in | Wt 238.7 lb

## 2019-04-20 DIAGNOSIS — Z6841 Body Mass Index (BMI) 40.0 and over, adult: Secondary | ICD-10-CM | POA: Diagnosis not present

## 2019-04-20 DIAGNOSIS — E119 Type 2 diabetes mellitus without complications: Secondary | ICD-10-CM | POA: Insufficient documentation

## 2019-04-20 DIAGNOSIS — Z713 Dietary counseling and surveillance: Secondary | ICD-10-CM | POA: Diagnosis not present

## 2019-04-20 NOTE — Patient Instructions (Signed)
Check blood sugars 2 x day before breakfast and 2 hrs after supper every day Bring blood sugar records to the next class  Exercise: Continue walking for  20-30  minutes 3 days a week and gradually increase to 30 minutes 5 x week  Eat 3 meals day,  1-2  snacks a day Space meals 4-6 hours apart Don't skip meals  Carry fast acting glucose and a snack at all times Rotate injection sites Hold insulin pen in place for 5-10 seconds after injection  Return for classes on:

## 2019-04-20 NOTE — Progress Notes (Signed)
Diabetes Self-Management Education  Visit Type: First/Initial  Appt. Start Time: 1345 Appt. End Time: 9798  04/20/2019  [name], identified by name and date of birth, is a 48 y.o. female with a diagnosis of Diabetes: Type 2.   ASSESSMENT  Blood pressure 110/80, height 5\' 3"  (1.6 m), weight 238 lb 11.2 oz (108.3 kg). Body mass index is 42.28 kg/m.  Diabetes Self-Management Education - 04/20/19 1540      Visit Information   Visit Type  First/Initial      Initial Visit   Diabetes Type  Type 2    Are you currently following a meal plan?  Yes    What type of meal plan do you follow?  "cooking more healthier, eating more than once"    Are you taking your medications as prescribed?  Yes    Date Diagnosed  2 weeks ago      Health Coping   How would you rate your overall health?  Fair      Psychosocial Assessment   Patient Belief/Attitude about Diabetes  Other (comment)   "mixed emotions"   Self-care barriers  None    Self-management support  Doctor's office;Family    Patient Concerns  Nutrition/Meal planning;Medication;Glycemic Control;Weight Control;Monitoring    Special Needs  None    Preferred Learning Style  Auditory;Visual;Hands on    [place] in progress    How often do you need to have someone help you when you read instructions, pamphlets, or other written materials from your doctor or pharmacy?  1 - Never    What is the last grade level you completed in school?  some college      Pre-Education Assessment   Patient understands the diabetes disease and treatment process.  Needs Instruction    Patient understands incorporating nutritional management into lifestyle.  Needs Instruction    Patient undertands incorporating physical activity into lifestyle.  Needs Instruction    Patient understands using medications safely.  Needs Instruction    Patient understands monitoring blood glucose, interpreting and using results  Needs Review    Patient  understands prevention, detection, and treatment of acute complications.  Needs Instruction    Patient understands prevention, detection, and treatment of chronic complications.  Needs Instruction    Patient understands how to develop strategies to address psychosocial issues.  Needs Instruction    Patient understands how to develop strategies to promote health/change behavior.  Needs Instruction      Complications   Last HgB A1C per patient/outside source  8.6 %   03/2019   How often do you check your blood sugar?  3-4 times/day    Fasting Blood glucose range (mg/dL)  70-129;130-179;180-200   much improved in last few weeks - FBG's 103-186 mg/dL   Postprandial Blood glucose range (mg/dL)  70-129;130-179;180-200;>200   pp's 100-226 mg/dL.   Have you had a dilated eye exam in the past 12 months?  Yes    Have you had a dental exam in the past 12 months?  Yes    Are you checking your feet?  No      Dietary Intake   Breakfast  has been eating only 1 meal/day - now has oatmeal or eggs    Snack (morning)  no snacks    Lunch  chicken breast and cheese with tortilla and lite mayo; ham or bologna sandwich    Dinner  beef, chicken pork, fish potatoes, bread, corn, green beans, peas, rice, pasta, lettuce, tomatoes,  peppers, onions, greens, cabbage    Beverage(s)  water, sugar free flavored drink      Exercise   Exercise Type  Light (walking / raking leaves)    How many days per week to you exercise?  3    How many minutes per day do you exercise?  25    Total minutes per week of exercise  75      Patient Education   Previous Diabetes Education  No    Disease state   Definition of diabetes, type 1 and 2, and the diagnosis of diabetes;Factors that contribute to the development of diabetes    Nutrition management   Role of diet in the treatment of diabetes and the relationship between the three main macronutrients and blood glucose level;Reviewed blood glucose goals for pre and post meals and how  to evaluate the patients' food intake on their blood glucose level.    Physical activity and exercise   Role of exercise on diabetes management, blood pressure control and cardiac health.    Medications  Taught/reviewed insulin injection, site rotation, insulin storage and needle disposal.;Reviewed patients medication for diabetes, action, purpose, timing of dose and side effects.    Monitoring  Purpose and frequency of SMBG.;Taught/discussed recording of test results and interpretation of SMBG.;Identified appropriate SMBG and/or A1C goals.    Acute complications  Taught treatment of hypoglycemia - the 15 rule.    Chronic complications  Relationship between chronic complications and blood glucose control    Psychosocial adjustment  Identified and addressed patients feelings and concerns about diabetes      Individualized Goals (developed by patient)   Reducing Risk  Improve blood sugars Decrease medications Prevent diabetes complications Lose weight Become more fit     Outcomes   Expected Outcomes  Demonstrated interest in learning. Expect positive outcomes    Future DMSE  2 wks       Individualized Plan for Diabetes Self-Management Training:   Learning Objective:  Patient will have a greater understanding of diabetes self-management. Patient education plan is to attend individual and/or group sessions per assessed needs and concerns.   Plan:   Patient Instructions  Check blood sugars 2 x day before breakfast and 2 hrs after supper every day Bring blood sugar records to the next class Exercise: Continue walking for  20-30  minutes 3 days a week and gradually increase to 30 minutes 5 x week Eat 3 meals day,  1-2  snacks a day Space meals 4-6 hours apart Don't skip meals Carry fast acting glucose and a snack at all times Rotate injection sites Hold insulin pen in place for 5-10 seconds after injection  Expected Outcomes:  Demonstrated interest in learning. Expect positive  outcomes  Education material provided:  General Meal Planning Guidelines Simple Meal Plan Glucose tablets Symptoms, causes and treatments of Hypoglycemia Pen Needle injection profile (BD) Getting Started Take Home Brochure (BD)  If problems or questions, patient to contact team via:  { Johny Drilling, College, [place], CDE [phone]  Future DSME appointment: 2 wks  May 07, 2019 for Diabetes Class 1

## 2019-04-25 DIAGNOSIS — Z79899 Other long term (current) drug therapy: Secondary | ICD-10-CM | POA: Diagnosis not present

## 2019-04-25 DIAGNOSIS — E119 Type 2 diabetes mellitus without complications: Secondary | ICD-10-CM | POA: Diagnosis not present

## 2019-04-26 LAB — HM DIABETES EYE EXAM

## 2019-04-29 ENCOUNTER — Encounter: Payer: Self-pay | Admitting: Internal Medicine

## 2019-04-30 ENCOUNTER — Encounter: Payer: Self-pay | Admitting: Internal Medicine

## 2019-04-30 ENCOUNTER — Other Ambulatory Visit: Payer: Self-pay | Admitting: Internal Medicine

## 2019-04-30 DIAGNOSIS — E1165 Type 2 diabetes mellitus with hyperglycemia: Secondary | ICD-10-CM

## 2019-04-30 MED ORDER — GLUCOSE BLOOD VI STRP: ORAL_STRIP | 12 refills | Status: DC

## 2019-05-07 ENCOUNTER — Other Ambulatory Visit: Payer: Self-pay

## 2019-05-07 ENCOUNTER — Encounter: Payer: BC Managed Care – PPO | Attending: Internal Medicine | Admitting: Dietician

## 2019-05-07 ENCOUNTER — Encounter: Payer: Self-pay | Admitting: Dietician

## 2019-05-07 DIAGNOSIS — Z713 Dietary counseling and surveillance: Secondary | ICD-10-CM | POA: Diagnosis not present

## 2019-05-07 DIAGNOSIS — E119 Type 2 diabetes mellitus without complications: Secondary | ICD-10-CM | POA: Insufficient documentation

## 2019-05-07 DIAGNOSIS — Z6841 Body Mass Index (BMI) 40.0 and over, adult: Secondary | ICD-10-CM | POA: Insufficient documentation

## 2019-05-07 DIAGNOSIS — E1165 Type 2 diabetes mellitus with hyperglycemia: Secondary | ICD-10-CM

## 2019-05-07 NOTE — Progress Notes (Signed)

## 2019-05-09 ENCOUNTER — Other Ambulatory Visit: Payer: Self-pay

## 2019-05-10 ENCOUNTER — Encounter: Payer: Self-pay | Admitting: Internal Medicine

## 2019-05-11 ENCOUNTER — Other Ambulatory Visit: Payer: Self-pay | Admitting: Internal Medicine

## 2019-05-11 DIAGNOSIS — E119 Type 2 diabetes mellitus without complications: Secondary | ICD-10-CM

## 2019-05-11 MED ORDER — METFORMIN HCL 500 MG PO TABS: 500.0000 mg | ORAL_TABLET | Freq: Two times a day (BID) | ORAL | 3 refills | Status: DC

## 2019-05-14 ENCOUNTER — Encounter: Payer: Self-pay | Admitting: Dietician

## 2019-05-14 ENCOUNTER — Telehealth: Payer: Self-pay | Admitting: Dietician

## 2019-05-14 ENCOUNTER — Ambulatory Visit: Payer: BLUE CROSS/BLUE SHIELD

## 2019-05-14 NOTE — Telephone Encounter (Signed)
Patient called to cancel her attendance in diabetes class 2 today, as she is not feeling well. She will return for class 3 as scheduled for 06/07/19.

## 2019-05-14 NOTE — Progress Notes (Signed)
Pt called and cancelled class 2 because she is sick-called pt back and she does not want to reschedule class 2 at this time due to not having her work schedule -pt will call back later and reschedule class 2

## 2019-06-07 ENCOUNTER — Ambulatory Visit: Payer: BLUE CROSS/BLUE SHIELD

## 2019-06-12 ENCOUNTER — Encounter: Payer: Self-pay | Admitting: *Deleted

## 2019-07-11 ENCOUNTER — Ambulatory Visit: Payer: BLUE CROSS/BLUE SHIELD | Admitting: Internal Medicine

## 2019-07-11 DIAGNOSIS — Z0289 Encounter for other administrative examinations: Secondary | ICD-10-CM

## 2019-09-14 ENCOUNTER — Telehealth: Payer: Self-pay | Admitting: Internal Medicine

## 2019-09-14 ENCOUNTER — Other Ambulatory Visit: Payer: Self-pay | Admitting: Internal Medicine

## 2019-09-14 DIAGNOSIS — E559 Vitamin D deficiency, unspecified: Secondary | ICD-10-CM

## 2019-09-14 MED ORDER — CHOLECALCIFEROL 1.25 MG (50000 UT) PO CAPS: 50000.0000 [IU] | ORAL_CAPSULE | ORAL | 1 refills | Status: DC

## 2019-09-14 NOTE — Telephone Encounter (Signed)
1. Call pt  2. Scan in 09/11/19 labs scanned in labs not in epic (in your box) 3. Fax copy to Dr. Gabriel Carina below     Liver kidneys normal  Iron normal  Tc 202, tgs 94, hdl 27, ldl 158 high  -is she taking lipitor 40 mg at night?   TSH elevated 4.940 she needs to call endocrine and schedule f/u  Melinda Ross, Melinda Evener, Melinda Ross  [address]  [place], [place] [address]  [phone]  [phone] (Fax)   Blood cts normal  A1C 5.8 improved  Vitamin D 14.8 low sent D3 to pharmacy 1x per week  ESR normal 28 sed rate  B12 396 normal  cRP 5 normal

## 2019-09-14 NOTE — Telephone Encounter (Signed)
Left message for patient to return call back. PEC may give results and obtain information.  

## 2019-09-17 NOTE — Telephone Encounter (Addendum)
Pt given results per Dr Terese Door, "  Sshe has not been taking her lipitor

## 2019-09-17 NOTE — Telephone Encounter (Addendum)
Pt given remainder of lab results;    she verbalized understanding; she would also like to speak with Dr McLean-Scocuzza regarding speaking with endocrine MD; unable to chart in result note because encounter not created.

## 2019-09-18 ENCOUNTER — Encounter: Payer: Self-pay | Admitting: Internal Medicine

## 2019-09-18 NOTE — Telephone Encounter (Signed)
Spoken to patient, she needed clarification on results. And phone number given to patient to cll to schedule follow up w/ Dr Gabriel Carina.

## 2019-09-18 NOTE — Telephone Encounter (Signed)
What is her ? About endocrine?

## 2019-09-21 DIAGNOSIS — E039 Hypothyroidism, unspecified: Secondary | ICD-10-CM | POA: Diagnosis not present

## 2019-09-21 DIAGNOSIS — F17200 Nicotine dependence, unspecified, uncomplicated: Secondary | ICD-10-CM | POA: Diagnosis not present

## 2019-09-21 DIAGNOSIS — E042 Nontoxic multinodular goiter: Secondary | ICD-10-CM | POA: Diagnosis not present

## 2019-10-25 DIAGNOSIS — E042 Nontoxic multinodular goiter: Secondary | ICD-10-CM | POA: Diagnosis not present

## 2019-12-22 DIAGNOSIS — K529 Noninfective gastroenteritis and colitis, unspecified: Secondary | ICD-10-CM | POA: Diagnosis not present

## 2019-12-24 DIAGNOSIS — R110 Nausea: Secondary | ICD-10-CM | POA: Diagnosis not present

## 2019-12-24 DIAGNOSIS — R197 Diarrhea, unspecified: Secondary | ICD-10-CM | POA: Diagnosis not present

## 2020-02-09 ENCOUNTER — Ambulatory Visit: Payer: Self-pay | Attending: Internal Medicine

## 2020-02-09 DIAGNOSIS — Z23 Encounter for immunization: Secondary | ICD-10-CM

## 2020-02-09 NOTE — Progress Notes (Signed)
   Covid-19 Vaccination Clinic  Name:  Melinda Ross    MRN: [id number] DOB: 20-Aug-1971  02/09/2020  [name] was observed post Covid-19 immunization for 15 minutes without incident. She was provided with Vaccine Information Sheet and instruction to access the V-Safe system.   [name] was instructed to call 911 with any severe reactions post vaccine: Marland Kitchen Difficulty breathing  . Swelling of face and throat  . A fast heartbeat  . A bad rash all over body  . Dizziness and weakness   Immunizations Administered    Name Date Dose VIS Date Route   Pfizer COVID-19 Vaccine 02/09/2020  1:21 PM 0.3 mL 11/02/2019 Intramuscular   Manufacturer: [place]   Lot: [id number]   [place]: [id number]

## 2020-02-24 ENCOUNTER — Other Ambulatory Visit: Payer: Self-pay

## 2020-02-24 ENCOUNTER — Encounter: Payer: Self-pay | Admitting: Emergency Medicine

## 2020-02-24 ENCOUNTER — Emergency Department
Admission: EM | Admit: 2020-02-24 | Discharge: 2020-02-24 | Disposition: A | Discharge status: Home / Self Care | Payer: BC Managed Care – PPO | Attending: Emergency Medicine | Admitting: Emergency Medicine

## 2020-02-24 ENCOUNTER — Emergency Department: Payer: BC Managed Care – PPO

## 2020-02-24 DIAGNOSIS — F17210 Nicotine dependence, cigarettes, uncomplicated: Secondary | ICD-10-CM | POA: Insufficient documentation

## 2020-02-24 DIAGNOSIS — Y9389 Activity, other specified: Secondary | ICD-10-CM | POA: Insufficient documentation

## 2020-02-24 DIAGNOSIS — S99912A Unspecified injury of left ankle, initial encounter: Secondary | ICD-10-CM | POA: Insufficient documentation

## 2020-02-24 DIAGNOSIS — S93602A Unspecified sprain of left foot, initial encounter: Secondary | ICD-10-CM | POA: Insufficient documentation

## 2020-02-24 DIAGNOSIS — X58XXXA Exposure to other specified factors, initial encounter: Secondary | ICD-10-CM | POA: Insufficient documentation

## 2020-02-24 DIAGNOSIS — R600 Localized edema: Secondary | ICD-10-CM | POA: Diagnosis not present

## 2020-02-24 DIAGNOSIS — E119 Type 2 diabetes mellitus without complications: Secondary | ICD-10-CM | POA: Insufficient documentation

## 2020-02-24 DIAGNOSIS — Y999 Unspecified external cause status: Secondary | ICD-10-CM | POA: Insufficient documentation

## 2020-02-24 DIAGNOSIS — Y929 Unspecified place or not applicable: Secondary | ICD-10-CM | POA: Diagnosis not present

## 2020-02-24 DIAGNOSIS — T1490XA Injury, unspecified, initial encounter: Secondary | ICD-10-CM

## 2020-02-24 DIAGNOSIS — M25572 Pain in left ankle and joints of left foot: Secondary | ICD-10-CM | POA: Insufficient documentation

## 2020-02-24 DIAGNOSIS — S99922A Unspecified injury of left foot, initial encounter: Secondary | ICD-10-CM | POA: Diagnosis not present

## 2020-02-24 NOTE — ED Provider Notes (Signed)
[facility] Emergency Department Provider Note  ____________________________________________   First MD Initiated Contact with Patient 02/24/20 1909     (approximate)  I have reviewed the triage vital signs and the nursing notes.   HISTORY  Chief Complaint Ankle Pain    HPI Melinda Ross is a 49 y.o. female presents emergency department complaining of left ankle and foot pain.  Patient stepped on a crack in [place] over the weekend.  States that she twisted her foot.  Pain and swelling since the incident.  No numbness or tingling.    Past Medical History:  Diagnosis Date  . Anemia   . Anxiety   . Attention deficit disorder (ADD)   . Collagen vascular disease (HCC)    lupus  . Diabetes mellitus without complication ([place])   . DVT of leg (deep venous thrombosis) ([place])   . Genital herpes 2015  . Hyperlipidemia   . Lupus ([place])    SEES DR Duard Brady KERNODLE  Discoid  . Mild depression ([place])   . Numbness   . Pulmonary emboli ([place])   . UTI (urinary tract infection)     Patient Active Problem List   Diagnosis Date Noted  . Type 2 diabetes mellitus with hyperglycemia, without long-term current use of insulin ([place]) 04/04/2019  . Hyperlipidemia 04/26/2018  . Vitamin D deficiency 04/26/2018  . Ovarian cyst, left 03/20/2018  . Irregular menses 03/20/2018  . Low back pain 03/20/2018  . Fibroid 03/20/2018  . Thyroid nodule 03/20/2018  . SLE (systemic lupus erythematosus) ([place]) 03/20/2018  . Tobacco abuse 03/20/2018  . DVT (deep venous thrombosis) ([place]) 01/09/2018  . Pulmonary emboli ([place]) 12/16/2017  . 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 Numbness     Past Surgical History:  Procedure Laterality Date  . ABLATION     2017 Dr. Livingston Diones   . CESAREAN SECTION  2003   [facility]  . DG TOES*L* Bilateral    2 bones removed in small toes 2002   . DILATATION & CURETTAGE/HYSTEROSCOPY WITH MYOSURE N/A 12/24/2016   Procedure: [place];  Surgeon: Malachy Mood, MD;  Location: [facility] ORS;  Service: Gynecology;  Laterality: N/A;  . DILATION AND CURETTAGE OF UTERUS    . HYSTEROSCOPY WITH NOVASURE N/A 03/25/2017   Procedure: HYSTEROSCOPY WITH NOVASURE;  Surgeon: Malachy Mood, MD;  Location: [facility] ORS;  Service: Gynecology;  Laterality: N/A;  . TUBAL LIGATION  2003   [facility]    Prior to Admission medications   Medication Sig Start Date End Date Taking? Authorizing Provider  atorvastatin (LIPITOR) 40 MG tablet Take 1 tablet (40 mg total) by mouth daily. 10/02/18   McLean-Scocuzza, Nino Glow, MD  blood glucose meter kit and supplies Dispense based on patient and insurance preference. Use up to four times daily as directed. (FOR ICD-10 E10.9, E11.9). 04/04/19   McLean-Scocuzza, Nino Glow, MD  cetirizine (ZYRTEC) 10 MG tablet Take 10 mg by mouth daily as needed for allergies.    [provider]  Cholecalciferol 1.25 MG (50000 UT) capsule Take 1 capsule (50,000 Units total) by mouth once a week. 09/14/19   McLean-Scocuzza, Nino Glow, MD  cyclobenzaprine (FLEXERIL) 5 MG tablet Take 1 tablet (5 mg total) by mouth at bedtime as needed for muscle spasms. 04/26/18   McLean-Scocuzza, Nino Glow, MD  ELIQUIS 5 MG TABS tablet Take 1 tablet (5 mg total) by mouth daily. 06/07/18   Stegmayer, Joelene Millin A, PA-C  ferrous sulfate 325 (65 FE) MG EC tablet  Take 1 tablet (325 mg total) by mouth 2 (two) times daily with a meal. Patient not taking: Reported on 04/20/2019 03/24/18   Earlie Server, MD  glucose blood test strip Use as instructed check tid E 11.9 04/30/19   McLean-Scocuzza, Nino Glow, MD  hydroxychloroquine (PLAQUENIL) 200 MG tablet Take 200 mg by mouth 2 (two) times daily.    [provider]  insulin degludec (TRESIBA FLEXTOUCH) 100 UNIT/ML SOPN FlexTouch Pen Inject 0.25 mLs (25 Units total) into the skin daily. With breakfast 04/13/19   McLean-Scocuzza, Nino Glow, MD  Insulin Pen Needle (PEN NEEDLES)  30G X 8 MM MISC 1 Device by Does not apply route daily with breakfast. 04/04/19   McLean-Scocuzza, Nino Glow, MD  metFORMIN (GLUCOPHAGE) 500 MG tablet Take 1 tablet (500 mg total) by mouth 2 (two) times daily with a meal. 05/11/19   McLean-Scocuzza, Nino Glow, MD  sitaGLIPtin (JANUVIA) 100 MG tablet Take 1 tablet (100 mg total) by mouth daily. 04/04/19   McLean-Scocuzza, Nino Glow, MD  traMADol (ULTRAM) 50 MG tablet Take 1 tablet (50 mg total) by mouth every 12 (twelve) hours as needed. 05/05/18   McLean-Scocuzza, Nino Glow, MD    Allergies Patient has no known allergies.  Family History  Problem Relation Age of Onset  . High blood pressure Brother   . Diabetes Brother   . Kidney disease Brother   . Arthritis Brother   . Birth defects Brother   . Lung cancer Mother   . COPD Mother   . Hearing loss Father   . Heart disease Father   . Hypertension Father   . Heart disease Sister   . Hypertension Sister   . Depression Daughter   . Diabetes Maternal Aunt     Social History Social History   Tobacco Use  . Smoking status: Current Every Day Smoker    Packs/day: 1.00    Years: 30.00    Pack years: 30.00    Types: Cigarettes  . Smokeless tobacco: Never Used  Substance Use Topics  . Alcohol use: Yes    Comment: social - once every 2-3 months   . Drug use: No    Review of Systems  Constitutional: No fever/chills Eyes: No visual changes. ENT: No sore throat. Respiratory: Denies cough Cardiovascular: Denies chest pain Gastrointestinal: Denies abdominal pain Genitourinary: Negative for dysuria. Musculoskeletal: Negative for back pain.  Left foot and ankle pain Skin: Negative for rash. Psychiatric: no mood changes,     ____________________________________________   PHYSICAL EXAM:  VITAL SIGNS: ED Triage Vitals [02/24/20 1859]  Enc Vitals Group     BP 122/75     Pulse Rate 99     Resp 18     Temp 98.4 F (36.9 C)     Temp Source Oral     SpO2 99 %     Weight 235 lb (106.6  kg)     Height '5\' 3"'  (1.6 m)     Head Circumference      Peak Flow      Pain Score 8     Pain Loc      Pain Edu?      Excl. in [place]?     Constitutional: Alert and oriented. Well appearing and in no acute distress. Eyes: Conjunctivae are normal.  Head: Atraumatic. Nose: No congestion/rhinnorhea. Mouth/Throat: Mucous membranes are moist.   Neck:  supple no lymphadenopathy noted Cardiovascular: Normal rate, regular rhythm.  Respiratory: Normal respiratory effort.  No retractions GU: deferred Musculoskeletal:  FROM all extremities, warm and well perfused, left foot is swollen and tender along the fifth metatarsal.  Neurovascular is intact, distal fibula is nontender Neurologic:  Normal speech and language.  Skin:  Skin is warm, dry and intact. No rash noted. Psychiatric: Mood and affect are normal. Speech and behavior are normal.  ____________________________________________   LABS (all labs ordered are listed, but only abnormal results are displayed)  Labs Reviewed - No data to display ____________________________________________   ____________________________________________  RADIOLOGY  X-ray left foot is negative for fracture  ____________________________________________   PROCEDURES  Procedure(s) performed: Ace wrap wooden shoe   Procedures    ____________________________________________   INITIAL IMPRESSION / ASSESSMENT AND PLAN / ED COURSE  Pertinent labs & imaging results that were available during my care of the patient were reviewed by me and considered in my medical decision making (see chart for details).   Patient is 49 year old female presents emergency department with concerns of left foot injury.  States she tripped on a crack in the sidewalk in [place].  States pain and swelling immediately.  She did not have her insurance card so she wanted to wait till she got home to be checked.  Physical exam shows patient to appear well.  Left foot swollen and  tender along the fifth metatarsal.  Neurovascular is intact.  Distal fibula is not tender  X-ray of the left foot is negative for fracture  I did explain the results to the patient.  She is placed in Ace wrap when she by nursing staff.  She is to follow-up with orthopedics if not improving in 5 to 7 days.  She was given a work note telling them to allow her to do light duty as she walks for 12 hours a day.  Feel that this would be too much on this sprain at this time.  She was instructed to take Tylenol or ibuprofen for pain as needed.  Elevate and ice.  Return if worsening.  She states she understands will comply.  She is discharged stable condition.    Melinda Ross was evaluated in Emergency Department on 02/24/2020 for the symptoms described in the history of present illness. She was evaluated in the context of the global COVID-19 pandemic, which necessitated consideration that the patient might be at risk for infection with the SARS-CoV-2 virus that causes COVID-19. Institutional protocols and algorithms that pertain to the evaluation of patients at risk for COVID-19 are in a state of rapid change based on information released by regulatory bodies including the CDC and federal and state organizations. These policies and algorithms were followed during the patient's care in the ED.   As part of my medical decision making, I reviewed the following data within the [place] notes reviewed and incorporated, Old chart reviewed, Radiograph reviewed , Notes from prior ED visits and [place] Controlled Substance Database  ____________________________________________   FINAL CLINICAL IMPRESSION(S) / ED DIAGNOSES  Final diagnoses:  Injury  Foot sprain, left, initial encounter      NEW MEDICATIONS STARTED DURING THIS VISIT:  Discharge Medication List as of 02/24/2020  8:11 PM       Note:  This document was prepared using Dragon voice recognition software and may include  unintentional dictation errors.    Versie Starks, PA-C 02/24/20 2105    Duffy Bruce, MD 02/27/20 2039

## 2020-02-24 NOTE — ED Triage Notes (Signed)
Pt to ER with c/o right foot/ankle pain after stepping in a crack in sidewalk and rolling foot over yesterday.

## 2020-02-24 NOTE — Discharge Instructions (Signed)
Wear the postop shoe until you are able to bear weight without difficulty.  Elevate and ice the foot especially after being on it for a while.  Take ibuprofen or Tylenol for pain as needed.  Return emergency department worsening

## 2020-03-03 DIAGNOSIS — M79671 Pain in right foot: Secondary | ICD-10-CM | POA: Diagnosis not present

## 2020-03-03 DIAGNOSIS — M7672 Peroneal tendinitis, left leg: Secondary | ICD-10-CM | POA: Diagnosis not present

## 2020-03-03 DIAGNOSIS — S93402A Sprain of unspecified ligament of left ankle, initial encounter: Secondary | ICD-10-CM | POA: Diagnosis not present

## 2020-03-03 DIAGNOSIS — M79672 Pain in left foot: Secondary | ICD-10-CM | POA: Diagnosis not present

## 2020-03-04 ENCOUNTER — Ambulatory Visit: Payer: PRIVATE HEALTH INSURANCE | Attending: Internal Medicine

## 2020-03-04 DIAGNOSIS — Z23 Encounter for immunization: Secondary | ICD-10-CM

## 2020-03-04 NOTE — Progress Notes (Signed)
   Covid-19 Vaccination Clinic  Name:  Melinda Ross    MRN: [id number] DOB: Jun 07, 1971  03/04/2020  [name] was observed post Covid-19 immunization for 15 minutes without incident. She was provided with Vaccine Information Sheet and instruction to access the V-Safe system.   [name] was instructed to call 911 with any severe reactions post vaccine: Marland Kitchen Difficulty breathing  . Swelling of face and throat  . A fast heartbeat  . A bad rash all over body  . Dizziness and weakness   Immunizations Administered    Name Date Dose VIS Date Route   Pfizer COVID-19 Vaccine 03/04/2020  2:16 PM 0.3 mL 11/02/2019 Intramuscular   Manufacturer: [place]   Lot: [id number]   [place]: [id number]

## 2020-06-26 DIAGNOSIS — G5601 Carpal tunnel syndrome, right upper limb: Secondary | ICD-10-CM | POA: Diagnosis not present

## 2020-06-26 DIAGNOSIS — M329 Systemic lupus erythematosus, unspecified: Secondary | ICD-10-CM | POA: Diagnosis not present

## 2020-07-18 DIAGNOSIS — L509 Urticaria, unspecified: Secondary | ICD-10-CM | POA: Diagnosis not present

## 2020-07-18 DIAGNOSIS — L930 Discoid lupus erythematosus: Secondary | ICD-10-CM | POA: Diagnosis not present

## 2020-07-18 DIAGNOSIS — Z79899 Other long term (current) drug therapy: Secondary | ICD-10-CM | POA: Diagnosis not present

## 2020-08-06 ENCOUNTER — Ambulatory Visit (INDEPENDENT_AMBULATORY_CARE_PROVIDER_SITE_OTHER): Payer: BC Managed Care – PPO | Admitting: Internal Medicine

## 2020-08-06 ENCOUNTER — Encounter: Payer: Self-pay | Admitting: Internal Medicine

## 2020-08-06 ENCOUNTER — Other Ambulatory Visit: Payer: Self-pay

## 2020-08-06 VITALS — BP 132/76 | HR 86 | Temp 98.6°F | Ht 63.0 in | Wt 236.2 lb

## 2020-08-06 DIAGNOSIS — E611 Iron deficiency: Secondary | ICD-10-CM

## 2020-08-06 DIAGNOSIS — E559 Vitamin D deficiency, unspecified: Secondary | ICD-10-CM

## 2020-08-06 DIAGNOSIS — Z0000 Encounter for general adult medical examination without abnormal findings: Secondary | ICD-10-CM | POA: Diagnosis not present

## 2020-08-06 DIAGNOSIS — Z6841 Body Mass Index (BMI) 40.0 and over, adult: Secondary | ICD-10-CM

## 2020-08-06 DIAGNOSIS — Z113 Encounter for screening for infections with a predominantly sexual mode of transmission: Secondary | ICD-10-CM

## 2020-08-06 DIAGNOSIS — Z1231 Encounter for screening mammogram for malignant neoplasm of breast: Secondary | ICD-10-CM

## 2020-08-06 DIAGNOSIS — Z9114 Patient's other noncompliance with medication regimen: Secondary | ICD-10-CM

## 2020-08-06 DIAGNOSIS — D219 Benign neoplasm of connective and other soft tissue, unspecified: Secondary | ICD-10-CM

## 2020-08-06 DIAGNOSIS — N926 Irregular menstruation, unspecified: Secondary | ICD-10-CM

## 2020-08-06 DIAGNOSIS — I2699 Other pulmonary embolism without acute cor pulmonale: Secondary | ICD-10-CM

## 2020-08-06 DIAGNOSIS — I82409 Acute embolism and thrombosis of unspecified deep veins of unspecified lower extremity: Secondary | ICD-10-CM | POA: Diagnosis not present

## 2020-08-06 DIAGNOSIS — L930 Discoid lupus erythematosus: Secondary | ICD-10-CM

## 2020-08-06 DIAGNOSIS — R350 Frequency of micturition: Secondary | ICD-10-CM

## 2020-08-06 DIAGNOSIS — E042 Nontoxic multinodular goiter: Secondary | ICD-10-CM

## 2020-08-06 DIAGNOSIS — M329 Systemic lupus erythematosus, unspecified: Secondary | ICD-10-CM

## 2020-08-06 DIAGNOSIS — E119 Type 2 diabetes mellitus without complications: Secondary | ICD-10-CM

## 2020-08-06 MED ORDER — APIXABAN 5 MG PO TABS: 5.0000 mg | ORAL_TABLET | Freq: Two times a day (BID) | ORAL | 3 refills | Status: DC

## 2020-08-06 NOTE — Progress Notes (Signed)
Pre visit review using our clinic review tool, if applicable. No additional management support is needed unless otherwise documented below in the visit note. 

## 2020-08-06 NOTE — Progress Notes (Signed)
Chief Complaint  Patient presents with  . Annual Exam   Annual  1. H/o DVT/PE pt has been off meds ~ 5 months rec she f/u heme/onc and resume eliquis 5 mg bid as rec by h/o chronically in the past  2. F/u derm discoid lupus and getting injections steroid and using topical steroid upper lip and area improving  3 .agreeable STD check c/o urinary freq and odor 4. H/o thyroid nodules/MNG due to see endocrine for [place] 10/25/20  5 of note she stopped all meds x 5 months I.e lipitor 40, eliquis 5 mg bid, off DM meds tresiba 25 u, metfomin 500  bidand januvia 100  6. DUB ablation did not help her in the past with h/o fibroids   Review of Systems  Constitutional: Negative for weight loss.  HENT: Negative for hearing loss.   Eyes: Negative for blurred vision.  Respiratory: Negative for shortness of breath.   Gastrointestinal: Negative for abdominal pain.  Genitourinary: Positive for frequency.       +odor   Skin: Positive for rash.  Neurological: Negative for headaches.  Psychiatric/Behavioral:       Alone due to all kids out of the house wants a dog    Past Medical History:  Diagnosis Date  . Anemia   . Anxiety   . Attention deficit disorder (ADD)   . Collagen vascular disease (HCC)    lupus  . Diabetes mellitus without complication ([place])   . DVT of leg (deep venous thrombosis) ([place])   . Genital herpes 2015  . Hyperlipidemia   . Lupus ([place])    SEES DR Duard Brady KERNODLE  Discoid  . Mild depression ([place])   . Numbness   . Pulmonary emboli ([place])   . UTI (urinary tract infection)    Past Surgical History:  Procedure Laterality Date  . ABLATION     2017 Dr. Livingston Diones   . CESAREAN SECTION  2003   [facility]  . DG TOES*L* Bilateral    2 bones removed in small toes 2002   . DILATATION & CURETTAGE/HYSTEROSCOPY WITH MYOSURE N/A 12/24/2016   Procedure: [place];  Surgeon: Malachy Mood, MD;  Location: [facility] ORS;  Service: Gynecology;   Laterality: N/A;  . DILATION AND CURETTAGE OF UTERUS    . HYSTEROSCOPY WITH NOVASURE N/A 03/25/2017   Procedure: HYSTEROSCOPY WITH NOVASURE;  Surgeon: Malachy Mood, MD;  Location: [facility] ORS;  Service: Gynecology;  Laterality: N/A;  . TUBAL LIGATION  2003   [facility]   Family History  Problem Relation Age of Onset  . High blood pressure Brother   . Diabetes Brother   . Kidney disease Brother   . Arthritis Brother   . Birth defects Brother   . Lung cancer Mother   . COPD Mother   . Heart attack Mother   . Hearing loss Father   . Heart disease Father   . Hypertension Father   . Heart disease Sister   . Hypertension Sister   . Depression Daughter   . Diabetes Maternal Aunt   . Other Maternal Aunt   . Kidney failure Maternal Aunt    Social History   Socioeconomic History  . Marital status: Single    Spouse name: Not on file  . Number of children: 3  . Years of education: Not on file  . Highest education level: Not on file  Occupational History    Comment: [employer]  Tobacco Use  . Smoking status: Current Every Day  Smoker    Packs/day: 1.00    Years: 30.00    Pack years: 30.00    Types: Cigarettes  . Smokeless tobacco: Never Used  Vaping Use  . Vaping Use: Never used  Substance and Sexual Activity  . Alcohol use: Yes    Comment: social - once every 2-3 months   . Drug use: No  . Sexual activity: Yes    Birth control/protection: Surgical  Other Topics Concern  . Not on file  Social History Narrative   Patient is single and lives at home with her daughter. Patient works at [employer]. Patient has three children.   Right handed.    Caffeine -5   3 kids ages 88, 48 and 39 as of 08/06/20    Some college    Surveyor, mining at [employer] Strain:   . Difficulty of Paying Living Expenses: Not on file  Food Insecurity:   . Worried About Charity fundraiser in the Last Year: Not on file  . Ran Out of  Food in the Last Year: Not on file  Transportation Needs:   . Lack of Transportation (Medical): Not on file  . Lack of Transportation (Non-Medical): Not on file  Physical Activity:   . Days of Exercise per Week: Not on file  . Minutes of Exercise per Session: Not on file  Stress:   . Feeling of Stress : Not on file  Social Connections:   . Frequency of Communication with Friends and Family: Not on file  . Frequency of Social Gatherings with Friends and Family: Not on file  . Attends Religious Services: Not on file  . Active Member of Clubs or Organizations: Not on file  . Attends Archivist Meetings: Not on file  . Marital Status: Not on file  Intimate Partner Violence:   . Fear of Current or Ex-Partner: Not on file  . Emotionally Abused: Not on file  . Physically Abused: Not on file  . Sexually Abused: Not on file   Current Meds  Medication Sig  . blood glucose meter kit and supplies Dispense based on patient and insurance preference. Use up to four times daily as directed. (FOR ICD-10 E10.9, E11.9).  Marland Kitchen glucose blood test strip Use as instructed check tid E 11.9  . hydroxychloroquine (PLAQUENIL) 200 MG tablet Take 200 mg by mouth 2 (two) times daily.   No Known Allergies Recent Results (from the past 2160 hour(s))  Urine cytology ancillary only([place])     Status: None   Collection Time: 08/11/20  9:52 AM  Result Value Ref Range   Neisseria Gonorrhea Negative    Chlamydia Negative    Trichomonas Negative    Bacterial Vaginitis-Urine Negative    Candida Urine Negative    Molecular Comment      This specimen does not meet the strict criteria set by the FDA. The   Molecular Comment      result interpretation should be considered in conjunction with the   Molecular Comment patient's clinical history.    Comment Normal Reference Range Trichomonas - Negative    Comment Normal Reference [place] Chlamydia - Negative    Comment      Normal Reference Range Neisseria  Gonorrhea - Negative   Objective  Body mass index is 41.84 kg/m. Wt Readings from Last 3 Encounters:  08/06/20 236 lb 3.2 oz (107.1 kg)  02/24/20 235 lb (106.6 kg)  05/07/19 237 lb 8 oz (107.7 kg)   Temp Readings from Last 3 Encounters:  08/06/20 98.6 F (37 C) (Oral)  02/24/20 98.4 F (36.9 C) (Oral)  05/06/18 98.1 F (36.7 C) (Oral)   BP Readings from Last 3 Encounters:  08/06/20 132/76  02/24/20 122/75  04/20/19 110/80   Pulse Readings from Last 3 Encounters:  08/06/20 86  02/24/20 99  05/10/18 81    Physical Exam Vitals and nursing note reviewed.  Constitutional:      Appearance: Normal appearance. She is well-developed and well-groomed. She is morbidly obese.  HENT:     Head: Normocephalic and atraumatic.  Eyes:     Conjunctiva/sclera: Conjunctivae normal.     Pupils: Pupils are equal, round, and reactive to light.  Cardiovascular:     Rate and Rhythm: Normal rate and regular rhythm.     Heart sounds: Normal heart sounds. No murmur heard.   Pulmonary:     Effort: Pulmonary effort is normal.     Breath sounds: Normal breath sounds.  Chest:     Chest wall: No mass.     Breasts: Breasts are symmetrical.        Right: Normal.        Left: Normal.  Lymphadenopathy:     Upper Body:     Right upper body: No axillary adenopathy.     Left upper body: No axillary adenopathy.  Skin:    General: Skin is warm and dry.  Neurological:     General: No focal deficit present.     Mental Status: She is alert and oriented to person, place, and time. Mental status is at baseline.     Gait: Gait normal.  Psychiatric:        Attention and Perception: Attention and perception normal.        Mood and Affect: Mood and affect normal.        Speech: Speech normal.        Behavior: Behavior normal. Behavior is cooperative.        Thought Content: Thought content normal.        Cognition and Memory: Cognition and memory normal.        Judgment: Judgment normal.      Assessment  Plan  Annual physical exam Fasting labs before 08/29/20 visit cmet, cbc, lipid, tsh, T4, UA, protein urine, urine culture, iron, vit d, A1C, STD blood labs Flu shot will get at work  Per pt Tdap 2018  Consider pna 23 vx smoker  covid shot 2/2 utd consider booster   mammo had 04/13/18 neg fibrous breast [facility]  -will need repeat mammogram in future  Ordered [place] did not get at work this year 2021  Pap Dr Glennon Mac 05/10/18 neg pap neg HPV + trichomonas   Colonoscopy due age 50 disc with patient wants to wait  rec healthy diet and exercise and smoking cessatoin    Other pulmonary embolism without acute cor pulmonale, unspecified chronicity ([place]) - Plan: apixaban (ELIQUIS) 5 MG TABS tablet bid Pt will need to f/u with Dr. Tasia Catchings temp supply no f/u since 03/2018   Deep vein thrombosis (DVT) of lower extremity, unspecified chronicity, unspecified laterality, unspecified vein (HCC) - Plan: apixaban (ELIQUIS) 5 MG TABS tablet See above  Systemic lupus erythematosus, discoid lupus F/u with Dr. Jefm Bryant and Dr. Will Bonnet rheum and derm  Iron deficiency - Plan: Iron, TIBC and Ferritin Panel  Type 2 diabetes mellitus without complication, without long-term current use of insulin ([place]) -  Plan: Comprehensive metabolic panel, Lipid panel, Urinalysis, Routine w reflex microscopic, Urine Culture, Microalbumin / creatinine urine ratio, Hemoglobin A1c -of note pt stopped all meds 5 months ago tresiba 25 units, metformin 500 bid and januvia 100  Need labs asap   Multinodular goiter - Plan: TSH, T4, free Due f/u 10/2020 [facility] endocrine 10/25/20 thyroid US    Screening for venereal disease (VD) - Plan: Hepatitis C antibody, RPR, HSV(herpes smplx)abs-1+2(IgG+IgM)-bld, Urine cytology ancillary only([place])   Irregular menses Fibroid S/p ablation w/o success [facility]  Will need to f/u ob/gyn Dr. Glennon Mac  Morbid obesity with BMI of 40.0-44.9, adult ([place]) rec healthy diet and  exercise   Noncompliance with medication regimen  See HPI encouraged compliance     [place] [facility] saw 04/24/18 negative check for use of hydroxychloroquine rec cont use. Dr. Benay Pillow  09/11/19 labs scanned in labs  Liver kidneys normal  Iron normal  Tc 202, tgs 94, hdl 27, ldl 158 high  -is she taking lipitor 40 mg at night?   TSH elevated 4.940 she needs to call endocrine and schedule f/u  Solum, Felipa Evener, MD  [place]  [facility] [place], [place] [address]  [phone]  [phone] (Fax)   Blood cts normal  A1C 5.8 improved  Vitamin D 14.8 low sent D3 to pharmacy 1x per week  ESR normal 28 sed rate  B12 396 normal  cRP 5 normal    Provider: Dr. Olivia Mackie McLean-Scocuzza-Internal Medicine

## 2020-08-06 NOTE — Patient Instructions (Addendum)
Zevia soda  Thyroid ultrasound due 10/2020  [facility]  Ferrous sulfate 325 mg 1-2x per day with vitamin C500 mg daily to 2x day OR Multivitamin with iron   IMPRESSION: No significant abnormality.   RECOMMENDATION: Screening mammogram in one year if age 47 or above.   ACR BI-RADS CATEGORY 1 - Negative.  Letter Sent: Normal Exam.   -   Electronically Signed by: Osa Craver Narrative Performed by [facility] TOMOSYNTHESIS (3-D MAMMOGRAPHY) WAS PERFORMED.   TECHNIQUE: Bilateral digital screening mammogram was obtained without prior exam for comparison. This mammogram was analyzed by a CAD (Computer-Aided Detection) system.   FINDINGS: There are scattered areas of fibroglandular density. There are no suspicious masses or calcifications.  Specimen Collected: 04/13/18 7:11 AM Last Resulted: 04/13/18 7:12 AM  Received From: [facility]  Result Received: 07/01/20 5:19 PM     Iron Deficiency Anemia, Adult Iron-deficiency anemia is when you have a low amount of red blood cells or hemoglobin. This happens because you have too little iron in your body. Hemoglobin carries oxygen to parts of the body. Anemia can cause your body to not get enough oxygen. It may or may not cause symptoms. Follow these instructions at home: Medicines  Take over-the-counter and prescription medicines only as told by your doctor. This includes iron pills (supplements) and vitamins.  If you cannot handle taking iron pills by mouth, ask your doctor about getting iron through: ? A vein (intravenously). ? A shot (injection) into a muscle.  Take iron pills when your stomach is empty. If you cannot handle this, take them with food.  Do not drink milk or take antacids at the same time as your iron pills.  To prevent trouble pooping (constipation), eat fiber or take medicine (stool softener) as told by your doctor. Eating and drinking   Talk  with your doctor before changing the foods you eat. He or she may tell you to eat foods that have a lot of iron, such as: ? Liver. ? Lowfat (lean) beef. ? Breads and cereals that have iron added to them (fortified breads and cereals). ? Eggs. ? Dried fruit. ? Dark green, leafy vegetables.  Drink enough fluid to keep your pee (urine) clear or pale yellow.  Eat fresh fruits and vegetables that are high in vitamin C. They help your body to use iron. Foods with a lot of vitamin C include: ? Oranges. ? Peppers. ? Tomatoes. ? Mangoes. General instructions  Return to your normal activities as told by your doctor. Ask your doctor what activities are safe for you.  Keep yourself clean, and keep things clean around you (your surroundings). Anemia can make you get sick more easily.  Keep all follow-up visits as told by your doctor. This is important. Contact a doctor if:  You feel sick to your stomach (nauseous).  You throw up (vomit).  You feel weak.  You are sweating for no clear reason.  You have trouble pooping, such as: ? Pooping (having a bowel movement) less than 3 times a week. ? Straining to poop. ? Having poop that is hard, dry, or larger than normal. ? Feeling full or bloated. ? Pain in the lower belly. ? Not feeling better after pooping. Get help right away if:  You pass out (faint). If this happens, do not drive yourself to the hospital. Call your local emergency services (911 in the [place]  You have chest pain.  You have shortness of breath that: ? Is very bad. ? Gets worse with physical activity.  You have a fast heartbeat.  You get light-headed when getting up from sitting or lying down. This information is not intended to replace advice given to you by your health care provider. Make sure you discuss any questions you have with your health care provider. Document Revised: 10/21/2017 Document Reviewed: 07/28/2016 Elsevier Patient Education  [place].

## 2020-08-11 ENCOUNTER — Telehealth: Payer: Self-pay

## 2020-08-11 ENCOUNTER — Other Ambulatory Visit: Payer: BC Managed Care – PPO

## 2020-08-11 ENCOUNTER — Other Ambulatory Visit (HOSPITAL_COMMUNITY)
Admission: RE | Admit: 2020-08-11 | Discharge: 2020-08-11 | Disposition: A | Discharge status: Home / Self Care | Payer: BC Managed Care – PPO | Source: Ambulatory Visit | Attending: Internal Medicine | Admitting: Internal Medicine

## 2020-08-11 ENCOUNTER — Other Ambulatory Visit: Payer: Self-pay

## 2020-08-11 DIAGNOSIS — E559 Vitamin D deficiency, unspecified: Secondary | ICD-10-CM

## 2020-08-11 DIAGNOSIS — R350 Frequency of micturition: Secondary | ICD-10-CM | POA: Diagnosis not present

## 2020-08-11 DIAGNOSIS — Z113 Encounter for screening for infections with a predominantly sexual mode of transmission: Secondary | ICD-10-CM | POA: Insufficient documentation

## 2020-08-11 DIAGNOSIS — E611 Iron deficiency: Secondary | ICD-10-CM

## 2020-08-11 DIAGNOSIS — E119 Type 2 diabetes mellitus without complications: Secondary | ICD-10-CM

## 2020-08-11 DIAGNOSIS — E042 Nontoxic multinodular goiter: Secondary | ICD-10-CM

## 2020-08-11 NOTE — Telephone Encounter (Signed)
Seems like this is a personal issue dealing with the patient and she was being a bit difficult today  Apologize to her today and order labs where she wants to get them done  IF I need to reorder then in the system please let me know

## 2020-08-11 NOTE — Telephone Encounter (Signed)
NO need for further communication I agree!

## 2020-08-11 NOTE — Telephone Encounter (Signed)
Labs were changed to [facility] [place] after talking with patient & faxed to her job. There is no need for any further communication with this patient at this time.

## 2020-08-11 NOTE — Telephone Encounter (Signed)
Good morning Dr. Olivia Mackie! This patient had labs drawn today but unfortunately I was unable to complete her labs today and she will have them done at her office. Patient was seen and was frustrated with doing a dirty and clean urine for the test ordered and proceeded to vent about it. After she attempted to leave a urine. She was upset that she was unable to leave enough for the test. I explained to her that it was ok I will just adjust the order accordingly if we needed more I will let her know. I proceeded to do her blood draw but both times I was unable to get any blood because she moved each time I was trying to stick her. So she got really frustrated with me at that point. I tried to explain that I could change her orders to go to her job but at that point she was not listening to me and wanted me to get [facility]. I tried locating [place] and was unable to find him. So I walked back and explained that to her and she was on the phone at that time and she told me she would write down what she needed me to do. So while she was doing that I proceeded to the next patient. This however, frustrated her even more. She stated "She had never been treated like this" So I had asked [facility] to take over her care and once she found [facility] she continued to vent about the situation to him as well.

## 2020-08-11 NOTE — Telephone Encounter (Signed)
Discussed with all parties and will speak with PCP , labs orders have been faxed to place of employment as patient requested.

## 2020-08-13 ENCOUNTER — Other Ambulatory Visit: Payer: BC Managed Care – PPO

## 2020-08-13 LAB — URINE CYTOLOGY ANCILLARY ONLY
Bacterial Vaginitis-Urine: NEGATIVE
Candida Urine: NEGATIVE
Chlamydia: NEGATIVE
Comment: NEGATIVE
Comment: NEGATIVE
Comment: NORMAL
Neisseria Gonorrhea: NEGATIVE
Trichomonas: NEGATIVE

## 2020-08-14 DIAGNOSIS — Z23 Encounter for immunization: Secondary | ICD-10-CM | POA: Diagnosis not present

## 2020-08-18 ENCOUNTER — Encounter: Payer: Self-pay | Admitting: Internal Medicine

## 2020-08-18 ENCOUNTER — Telehealth: Payer: Self-pay | Admitting: Internal Medicine

## 2020-08-18 DIAGNOSIS — Z6841 Body Mass Index (BMI) 40.0 and over, adult: Secondary | ICD-10-CM | POA: Insufficient documentation

## 2020-08-18 DIAGNOSIS — E042 Nontoxic multinodular goiter: Secondary | ICD-10-CM | POA: Insufficient documentation

## 2020-08-18 NOTE — Telephone Encounter (Signed)
See last encounters on pt lab visit here unable to get her labs  F/u appt 08/29/20 but please make sure pt has fasting labs before this visit  Make sure has [facility] forms to get all labs done not done at last lab visit see ones not done under lab tab   If does not have any longer print again for pt to pick up and have labs before 08/29/20 appt  Thanks  [place]

## 2020-08-19 DIAGNOSIS — Z0189 Encounter for other specified special examinations: Secondary | ICD-10-CM | POA: Diagnosis not present

## 2020-08-19 NOTE — Telephone Encounter (Signed)
Left message to return call 

## 2020-08-26 ENCOUNTER — Telehealth: Payer: Self-pay | Admitting: Internal Medicine

## 2020-08-26 LAB — HM HEPATITIS C SCREENING LAB: HM Hepatitis Screen: NEGATIVE

## 2020-08-26 NOTE — Telephone Encounter (Signed)
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

## 2020-08-27 NOTE — Telephone Encounter (Signed)
Left message to call back  

## 2020-08-28 ENCOUNTER — Encounter: Payer: Self-pay | Admitting: Internal Medicine

## 2020-08-29 ENCOUNTER — Telehealth: Payer: Self-pay

## 2020-08-29 ENCOUNTER — Encounter: Payer: Self-pay | Admitting: Internal Medicine

## 2020-08-29 ENCOUNTER — Ambulatory Visit: Payer: BC Managed Care – PPO | Admitting: Internal Medicine

## 2020-08-29 ENCOUNTER — Other Ambulatory Visit: Payer: Self-pay

## 2020-08-29 VITALS — BP 124/80 | HR 95 | Temp 98.2°F | Ht 63.0 in | Wt 234.2 lb

## 2020-08-29 DIAGNOSIS — R7303 Prediabetes: Secondary | ICD-10-CM | POA: Diagnosis not present

## 2020-08-29 DIAGNOSIS — L930 Discoid lupus erythematosus: Secondary | ICD-10-CM

## 2020-08-29 DIAGNOSIS — E559 Vitamin D deficiency, unspecified: Secondary | ICD-10-CM | POA: Diagnosis not present

## 2020-08-29 DIAGNOSIS — E785 Hyperlipidemia, unspecified: Secondary | ICD-10-CM

## 2020-08-29 DIAGNOSIS — Z6841 Body Mass Index (BMI) 40.0 and over, adult: Secondary | ICD-10-CM

## 2020-08-29 DIAGNOSIS — B009 Herpesviral infection, unspecified: Secondary | ICD-10-CM

## 2020-08-29 DIAGNOSIS — I2699 Other pulmonary embolism without acute cor pulmonale: Secondary | ICD-10-CM

## 2020-08-29 DIAGNOSIS — M329 Systemic lupus erythematosus, unspecified: Secondary | ICD-10-CM

## 2020-08-29 DIAGNOSIS — I82409 Acute embolism and thrombosis of unspecified deep veins of unspecified lower extremity: Secondary | ICD-10-CM

## 2020-08-29 NOTE — Telephone Encounter (Signed)
Faxed via [facility]

## 2020-08-29 NOTE — Telephone Encounter (Signed)
Patient has appointment in office today. Was unable to reach the Patient before hand. Will discuss this in office with her.

## 2020-08-29 NOTE — Patient Instructions (Addendum)
D3 4000 IU daily   Pneumonia 23 vaccine   Prediabetes Eating Plan Prediabetes is a condition that causes blood sugar (glucose) levels to be higher than normal. This increases the risk for developing diabetes. In order to prevent diabetes from developing, your health care provider may recommend a diet and other lifestyle changes to help you:  Control your blood glucose levels.  Improve your cholesterol levels.  Manage your blood pressure. Your health care provider may recommend working with a diet and nutrition specialist (dietitian) to make a meal plan that is best for you. What are tips for following this plan? Lifestyle  Set weight loss goals with the help of your health care team. It is recommended that most people with prediabetes lose 7% of their current body weight.  Exercise for at least 30 minutes at least 5 days a week.  Attend a support group or seek ongoing support from a mental health counselor.  Take over-the-counter and prescription medicines only as told by your health care provider. Reading food labels  Read food labels to check the amount of fat, salt (sodium), and sugar in prepackaged foods. Avoid foods that have: ? Saturated fats. ? Trans fats. ? Added sugars.  Avoid foods that have more than 300 milligrams (mg) of sodium per serving. Limit your daily sodium intake to less than 2,300 mg each day. Shopping  Avoid buying pre-made and processed foods. Cooking  Cook with olive oil. Do not use butter, lard, or ghee.  Bake, broil, grill, or boil foods. Avoid frying. Meal planning   Work with your dietitian to develop an eating plan that is right for you. This may include: ? Tracking how many calories you take in. Use a food diary, notebook, or mobile application to track what you eat at each meal. ? Using the glycemic index (GI) to plan your meals. The index tells you how quickly a food will raise your blood glucose. Choose low-GI foods. These foods take a  longer time to raise blood glucose.  Consider following a Mediterranean diet. This diet includes: ? Several servings each day of fresh fruits and vegetables. ? Eating fish at least twice a week. ? Several servings each day of whole grains, beans, nuts, and seeds. ? Using olive oil instead of other fats. ? Moderate alcohol consumption. ? Eating small amounts of red meat and whole-fat dairy.  If you have high blood pressure, you may need to limit your sodium intake or follow a diet such as the DASH eating plan. DASH is an eating plan that aims to lower high blood pressure. What foods are recommended? The items listed below may not be a complete list. Talk with your dietitian about what dietary choices are best for you. Grains Whole grains, such as whole-wheat or whole-grain breads, crackers, cereals, and pasta. Unsweetened oatmeal. Bulgur. Barley. Quinoa. Brown rice. Corn or whole-wheat flour tortillas or taco shells. Vegetables Lettuce. Spinach. Peas. Beets. Cauliflower. Cabbage. Broccoli. Carrots. Tomatoes. Squash. Eggplant. Herbs. Peppers. Onions. Cucumbers. Brussels sprouts. Fruits Berries. Bananas. Apples. Oranges. Grapes. Papaya. Mango. Pomegranate. Kiwi. Grapefruit. Cherries. Meats and other protein foods Seafood. Poultry without skin. Lean cuts of pork and beef. Tofu. Eggs. Nuts. Beans. Dairy Low-fat or fat-free dairy products, such as yogurt, cottage cheese, and cheese. Beverages Water. Tea. Coffee. Sugar-free or diet soda. Seltzer water. Lowfat or no-fat milk. Milk alternatives, such as soy or almond milk. Fats and oils Olive oil. Canola oil. Sunflower oil. Grapeseed oil. Avocado. Walnuts. Sweets and desserts Sugar-free or  low-fat pudding. Sugar-free or low-fat ice cream and other frozen treats. Seasoning and other foods Herbs. Sodium-free spices. Mustard. Relish. Low-fat, low-sugar ketchup. Low-fat, low-sugar barbecue sauce. Low-fat or fat-free mayonnaise. What foods are not  recommended? The items listed below may not be a complete list. Talk with your dietitian about what dietary choices are best for you. Grains Refined white flour and flour products, such as bread, pasta, snack foods, and cereals. Vegetables Canned vegetables. Frozen vegetables with butter or cream sauce. Fruits Fruits canned with syrup. Meats and other protein foods Fatty cuts of meat. Poultry with skin. Breaded or fried meat. Processed meats. Dairy Full-fat yogurt, cheese, or milk. Beverages Sweetened drinks, such as sweet iced tea and soda. Fats and oils Butter. Lard. Ghee. Sweets and desserts Baked goods, such as cake, cupcakes, pastries, cookies, and cheesecake. Seasoning and other foods Spice mixes with added salt. Ketchup. Barbecue sauce. Mayonnaise. Summary  To prevent diabetes from developing, you may need to make diet and other lifestyle changes to help control blood sugar, improve cholesterol levels, and manage your blood pressure.  Set weight loss goals with the help of your health care team. It is recommended that most people with prediabetes lose 7 percent of their current body weight.  Consider following a Mediterranean diet that includes plenty of fresh fruits and vegetables, whole grains, beans, nuts, seeds, fish, lean meat, low-fat dairy, and healthy oils. This information is not intended to replace advice given to you by your health care provider. Make sure you discuss any questions you have with your health care provider. Document Revised: 03/02/2019 Document Reviewed: 01/12/2017 Elsevier Patient Education  [place].  Cholesterol Content in Foods Cholesterol is a waxy, fat-like substance that helps to carry fat in the blood. The body needs cholesterol in small amounts, but too much cholesterol can cause damage to the arteries and heart. Most people should eat less than 200 milligrams (mg) of cholesterol a day. Foods with cholesterol  Cholesterol is  found in animal-based foods, such as meat, seafood, and dairy. Generally, low-fat dairy and lean meats have less cholesterol than full-fat dairy and fatty meats. The milligrams of cholesterol per serving (mg per serving) of common cholesterol-containing foods are listed below. Meat and other proteins  Egg -- one large whole egg has 186 mg.  Veal shank -- 4 oz has 141 mg.  Lean ground [place] (93% lean) -- 4 oz has 118 mg.  Fat-trimmed lamb loin -- 4 oz has 106 mg.  Lean ground beef (90% lean) -- 4 oz has 100 mg.  Lobster -- 3.5 oz has 90 mg.  Pork loin chops -- 4 oz has 86 mg.  Canned salmon -- 3.5 oz has 83 mg.  Fat-trimmed beef top loin -- 4 oz has 78 mg.  Frankfurter -- 1 frank (3.5 oz) has 77 mg.  Crab -- 3.5 oz has 71 mg.  Roasted chicken without skin, white meat -- 4 oz has 66 mg.  Light bologna -- 2 oz has 45 mg.  Deli-cut [place] -- 2 oz has 31 mg.  Canned tuna -- 3.5 oz has 31 mg.  Berniece Salines -- 1 oz has 29 mg.  Oysters and mussels (raw) -- 3.5 oz has 25 mg.  Mackerel -- 1 oz has 22 mg.  Trout -- 1 oz has 20 mg.  Pork sausage -- 1 link (1 oz) has 17 mg.  Salmon -- 1 oz has 16 mg.  Tilapia -- 1 oz has 14 mg. Dairy  Soft-serve  ice cream --  cup (4 oz) has 103 mg.  Whole-milk yogurt -- 1 cup (8 oz) has 29 mg.  Cheddar cheese -- 1 oz has 28 mg.  American cheese -- 1 oz has 28 mg.  Whole milk -- 1 cup (8 oz) has 23 mg.  2% milk -- 1 cup (8 oz) has 18 mg.  Cream cheese -- 1 tablespoon (Tbsp) has 15 mg.  Cottage cheese --  cup (4 oz) has 14 mg.  Low-fat (1%) milk -- 1 cup (8 oz) has 10 mg.  Sour cream -- 1 Tbsp has 8.5 mg.  Low-fat yogurt -- 1 cup (8 oz) has 8 mg.  Nonfat Greek yogurt -- 1 cup (8 oz) has 7 mg.  Half-and-half cream -- 1 Tbsp has 5 mg. Fats and oils  Cod liver oil -- 1 tablespoon (Tbsp) has 82 mg.  Butter -- 1 Tbsp has 15 mg.  Lard -- 1 Tbsp has 14 mg.  Bacon grease -- 1 Tbsp has 14 mg.  Mayonnaise -- 1 Tbsp has 5-10  mg.  Margarine -- 1 Tbsp has 3-10 mg. Exact amounts of cholesterol in these foods may vary depending on specific ingredients and brands. Foods without cholesterol Most plant-based foods do not have cholesterol unless you combine them with a food that has cholesterol. Foods without cholesterol include:  Grains and cereals.  Vegetables.  Fruits.  Vegetable oils, such as olive, canola, and sunflower oil.  Legumes, such as peas, beans, and lentils.  Nuts and seeds.  Egg whites. Summary  The body needs cholesterol in small amounts, but too much cholesterol can cause damage to the arteries and heart.  Most people should eat less than 200 milligrams (mg) of cholesterol a day. This information is not intended to replace advice given to you by your health care provider. Make sure you discuss any questions you have with your health care provider. Document Revised: 10/21/2017 Document Reviewed: 07/05/2017 Elsevier Patient Education  [place].  High Cholesterol  High cholesterol is a condition in which the blood has high levels of a white, waxy, fat-like substance (cholesterol). The human body needs small amounts of cholesterol. The liver makes all the cholesterol that the body needs. Extra (excess) cholesterol comes from the food that we eat. Cholesterol is carried from the liver by the blood through the blood vessels. If you have high cholesterol, deposits (plaques) may build up on the walls of your blood vessels (arteries). Plaques make the arteries narrower and stiffer. Cholesterol plaques increase your risk for heart attack and stroke. Work with your health care provider to keep your cholesterol levels in a healthy range. What increases the risk? This condition is more likely to develop in people who:  Eat foods that are high in animal fat (saturated fat) or cholesterol.  Are overweight.  Are not getting enough exercise.  Have a family history of high cholesterol. What  are the signs or symptoms? There are no symptoms of this condition. How is this diagnosed? This condition may be diagnosed from the results of a blood test.  If you are older than age 41, your health care provider may check your cholesterol every 4-6 years.  You may be checked more often if you already have high cholesterol or other risk factors for heart disease. The blood test for cholesterol measures:  "Bad" cholesterol (LDL cholesterol). This is the main type of cholesterol that causes heart disease. The desired level for LDL is less than 100.  "Good"  cholesterol (HDL cholesterol). This type helps to protect against heart disease by cleaning the arteries and carrying the LDL away. The desired level for HDL is 60 or higher.  Triglycerides. These are fats that the body can store or burn for energy. The desired number for triglycerides is lower than 150.  Total cholesterol. This is a measure of the total amount of cholesterol in your blood, including LDL cholesterol, HDL cholesterol, and triglycerides. A healthy number is less than 200. How is this treated? This condition is treated with diet changes, lifestyle changes, and medicines. Diet changes  This may include eating more whole grains, fruits, vegetables, nuts, and fish.  This may also include cutting back on red meat and foods that have a lot of added sugar. Lifestyle changes  Changes may include getting at least 40 minutes of aerobic exercise 3 times a week. Aerobic exercises include walking, biking, and swimming. Aerobic exercise along with a healthy diet can help you maintain a healthy weight.  Changes may also include quitting smoking. Medicines  Medicines are usually given if diet and lifestyle changes have failed to reduce your cholesterol to healthy levels.  Your health care provider may prescribe a statin medicine. Statin medicines have been shown to reduce cholesterol, which can reduce the risk of heart  disease. Follow these instructions at home: Eating and drinking If told by your health care provider:  Eat chicken (without skin), fish, veal, shellfish, ground [place] breast, and round or loin cuts of red meat.  Do not eat fried foods or fatty meats, such as hot dogs and salami.  Eat plenty of fruits, such as apples.  Eat plenty of vegetables, such as broccoli, potatoes, and carrots.  Eat beans, peas, and lentils.  Eat grains such as barley, rice, couscous, and bulgur wheat.  Eat pasta without cream sauces.  Use skim or nonfat milk, and eat low-fat or nonfat yogurt and cheeses.  Do not eat or drink whole milk, cream, ice cream, egg yolks, or hard cheeses.  Do not eat stick margarine or tub margarines that contain trans fats (also called partially hydrogenated oils).  Do not eat saturated tropical oils, such as coconut oil and palm oil.  Do not eat cakes, cookies, crackers, or other baked goods that contain trans fats.  General instructions  Exercise as directed by your health care provider. Increase your activity level with activities such as gardening, walking, and taking the stairs.  Take over-the-counter and prescription medicines only as told by your health care provider.  Do not use any products that contain nicotine or tobacco, such as cigarettes and e-cigarettes. If you need help quitting, ask your health care provider.  Keep all follow-up visits as told by your health care provider. This is important. Contact a health care provider if:  You are struggling to maintain a healthy diet or weight.  You need help to start on an exercise program.  You need help to stop smoking. Get help right away if:  You have chest pain.  You have trouble breathing. This information is not intended to replace advice given to you by your health care provider. Make sure you discuss any questions you have with your health care provider. Document Revised: 11/11/2017 Document  Reviewed: 05/08/2016 Elsevier Patient Education  [employer].  Pneumococcal Polysaccharide Vaccine (PPSV23): What You Need to Know 1. Why get vaccinated? Pneumococcal polysaccharide vaccine (PPSV23) can prevent pneumococcal disease. Pneumococcal disease refers to any illness caused by pneumococcal bacteria. These bacteria can  cause many types of illnesses, including pneumonia, which is an infection of the lungs. Pneumococcal bacteria are one of the most common causes of pneumonia. Besides pneumonia, pneumococcal bacteria can also cause:  Ear infections  Sinus infections  Meningitis (infection of the tissue covering the brain and spinal cord)  Bacteremia (bloodstream infection) Anyone can get pneumococcal disease, but children under 17 years of age, people with certain medical conditions, adults 60 years or older, and cigarette smokers are at the highest risk. Most pneumococcal infections are mild. However, some can result in long-term problems, such as brain damage or hearing loss. Meningitis, bacteremia, and pneumonia caused by pneumococcal disease can be fatal. 2. PPSV23 PPSV23 protects against 23 types of bacteria that cause pneumococcal disease. PPSV23 is recommended for:  All adults 73 years or older,  Anyone 2 years or older with certain medical conditions that can lead to an increased risk for pneumococcal disease. Most people need only one dose of PPSV23. A second dose of PPSV23, and another type of pneumococcal vaccine called PCV13, are recommended for certain high-risk groups. Your health care provider can give you more information. People 65 years or older should get a dose of PPSV23 even if they have already gotten one or more doses of the vaccine before they turned 72. 3. Talk with your health care provider Tell your vaccine provider if the person getting the vaccine:  Has had an allergic reaction after a previous dose of PPSV23, or has any severe, life-threatening  allergies. In some cases, your health care provider may decide to postpone PPSV23 vaccination to a future visit. People with minor illnesses, such as a cold, may be vaccinated. People who are moderately or severely ill should usually wait until they recover before getting PPSV23. Your health care provider can give you more information. 4. Risks of a vaccine reaction  Redness or pain where the shot is given, feeling tired, fever, or muscle aches can happen after PPSV23. People sometimes faint after medical procedures, including vaccination. Tell your provider if you feel dizzy or have vision changes or ringing in the ears. As with any medicine, there is a very remote chance of a vaccine causing a severe allergic reaction, other serious injury, or death. 5. What if there is a serious problem? An allergic reaction could occur after the vaccinated person leaves the clinic. If you see signs of a severe allergic reaction (hives, swelling of the face and throat, difficulty breathing, a fast heartbeat, dizziness, or weakness), call 9-1-1 and get the person to the nearest hospital. For other signs that concern you, call your health care provider. Adverse reactions should be reported to the Vaccine Adverse Event Reporting System (VAERS). Your health care provider will usually file this report, or you can do it yourself. Visit the VAERS website at www.vaers.SamedayNews.es or call 307-399-0942. VAERS is only for reporting reactions, and VAERS staff do not give medical advice. 6. How can I learn more?  Ask your health care provider.  Call your local or state health department.  Contact the Centers for Disease Control and Prevention (CDC): ? Call 725-661-3692 (1-800-CDC-INFO) or ? Visit CDC's website at http://hunter.com/ CDC Vaccine Information Statement PPSV23 Vaccine (09/20/2018) This information is not intended to replace advice given to you by your health care provider. Make sure you discuss any  questions you have with your health care provider. Document Revised: 02/27/2019 Document Reviewed: 06/20/2018 Elsevier Patient Education  [place].

## 2020-08-29 NOTE — Telephone Encounter (Signed)
Patient informed at appointment in office today

## 2020-08-29 NOTE — Telephone Encounter (Signed)
-----   Message from Delorise Jackson, MD sent at 08/18/2020  3:16 PM EDT ----- Fax note Dr. Gabriel Carina due thyroid US 10/25/20 please call pt to schedule

## 2020-08-29 NOTE — Progress Notes (Signed)
Chief Complaint  Patient presents with  . Follow-up   F/u  1. 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 Given letter today as she got a puppy since 07/2020 as she was depressed/anxiety/lonely as having empty nest his name is gizmo and wanted to know about ESA requirements for this dog. Given letter today as no forms for the dog today but mood is significantly improved with company of dog   Review of Systems  Constitutional: Negative for weight loss.  HENT: Negative for hearing loss.   Eyes: Negative for blurred vision.  Respiratory: Negative for shortness of breath.   Cardiovascular: Negative for chest pain.  Gastrointestinal: Negative for abdominal pain.  Musculoskeletal: Negative for falls.  Skin: Positive for rash.  Psychiatric/Behavioral: The patient is not nervous/anxious.    Past Medical History:  Diagnosis Date  . Anemia   . Anxiety   . Attention deficit disorder (ADD)   . Collagen vascular disease (HCC)    lupus  . Diabetes mellitus without complication ([place])   . DVT of leg (deep venous thrombosis) ([place])   . Genital herpes 2015  . Hyperlipidemia   . Lupus ([place])    SEES DR Duard Brady KERNODLE  Discoid  . Mild depression ([place])   . Numbness   . Pulmonary emboli ([place])   . UTI (urinary tract infection)    Past Surgical History:  Procedure Laterality Date  . ABLATION     2017 Dr. Livingston Diones   . CESAREAN SECTION  2003   [facility]  . DG TOES*L* Bilateral    2 bones  removed in small toes 2002   . DILATATION & CURETTAGE/HYSTEROSCOPY WITH MYOSURE N/A 12/24/2016   Procedure: [place];  Surgeon: Malachy Mood, MD;  Location: [facility] ORS;  Service: Gynecology;  Laterality: N/A;  . DILATION AND CURETTAGE OF UTERUS    . HYSTEROSCOPY WITH NOVASURE N/A 03/25/2017   Procedure: HYSTEROSCOPY WITH NOVASURE;  Surgeon: Malachy Mood, MD;  Location: [facility] OR[facility];  Service: Gynecology;  Laterality: N/A;  . TUBAL LIGATION  2003   [facility]   Family History  Problem Relation Age of Onset  . High blood pressure Brother   . Diabetes Brother   . Kidney disease Brother   . Arthritis Brother   . Birth defects Brother   . Lung cancer Mother   . COPD Mother   . Heart attack Mother   . Hearing loss Father   . Heart disease Father   . Hypertension Father   . Heart disease Sister   . Hypertension Sister   . Depression Daughter   . Diabetes Maternal Aunt   . Other Maternal Aunt   . Kidney failure Maternal Aunt    Social History   Socioeconomic History  . Marital status: Single    Spouse name: Not on file  . Number of children: 3  . Years of education: Not on file  .  Highest education level: Not on file  Occupational History    Comment: [employer]  Tobacco Use  . Smoking status: Current Every Day Smoker    Packs/day: 1.00    Years: 30.00    Pack years: 30.00    Types: Cigarettes  . Smokeless tobacco: Never Used  Vaping Use  . Vaping Use: Never used  Substance and Sexual Activity  . Alcohol use: Yes    Comment: social - once every 2-3 months   . Drug use: No  . Sexual activity: Yes    Birth control/protection: Surgical  Other Topics Concern  . Not on file  Social History Narrative   Patient is single and lives at home with her daughter. Patient works at [employer]. Patient has three children.   Right handed.    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 Difficulty of Paying Living Expenses: Not on file  Food Insecurity:   . Worried About Charity fundraiser in the Last Year: Not on file  . Ran Out of Food in the Last Year: Not on file  Transportation Needs:   . Lack of Transportation (Medical): Not on file  . Lack of Transportation (Non-Medical): Not on file  Physical Activity:   . Days of Exercise per Week: Not on file  . Minutes of Exercise per Session: Not on file  Stress:   . Feeling of Stress : Not on file  Social Connections:   . Frequency of Communication with Friends and Family: Not on file  . Frequency of Social Gatherings with Friends and Family: Not on file  . Attends Religious Services: Not on file  . Active Member of Clubs or Organizations: Not on file  . Attends Archivist Meetings: Not on file  . Marital Status: Not on file  Intimate Partner Violence:   . Fear of Current or Ex-Partner: Not on file  . Emotionally Abused: Not on file  . Physically Abused: Not on file  . Sexually Abused: Not on file   Current Meds  Medication Sig  . apixaban (ELIQUIS) 5 MG TABS tablet Take 1 tablet (5 mg total) by mouth 2 (two) times daily.  . blood glucose meter kit and supplies Dispense based on patient and insurance preference. Use up to four times daily as directed. (FOR ICD-10 E10.9, E11.9).  Marland Kitchen Cholecalciferol (VITAMIN D3 PO) Take by mouth.  . ferrous sulfate 325 (65 FE) MG tablet Take 325 mg by mouth daily with breakfast.  . glucose blood test strip Use as instructed check tid E 11.9  . hydroxychloroquine (PLAQUENIL) 200 MG tablet Take 200 mg by mouth 2 (two) times daily.  . Multiple Vitamin (MULTIVITAMIN ADULT PO) Take by mouth.   No Known Allergies Recent Results (from the past 2160 hour(s))  Urine cytology ancillary only([place])     Status: None   Collection Time: 08/11/20  9:52 AM  Result Value Ref Range   Neisseria Gonorrhea  Negative    Chlamydia Negative    Trichomonas Negative    Bacterial Vaginitis-Urine Negative    Candida Urine Negative    Molecular Comment      This specimen does not meet the strict criteria set by the FDA. The   Molecular Comment      result interpretation  should be considered in conjunction with the   Molecular Comment patient's clinical history.    Comment Normal Reference Range Trichomonas - Negative    Comment Normal Reference [place] Chlamydia - Negative    Comment      Normal Reference Range Neisseria Gonorrhea - Negative  HM HEPATITIS C SCREENING LAB     Status: None   Collection Time: 08/26/20 12:00 AM  Result Value Ref Range   HM Hepatitis Screen Negative-Validated    Objective  Body mass index is 41.49 kg/m. Wt Readings from Last 3 Encounters:  08/29/20 234 lb 3.2 oz (106.2 kg)  08/06/20 236 lb 3.2 oz (107.1 kg)  02/24/20 235 lb (106.6 kg)   Temp Readings from Last 3 Encounters:  08/29/20 98.2 F (36.8 C) (Oral)  08/06/20 98.6 F (37 C) (Oral)  02/24/20 98.4 F (36.9 C) (Oral)   BP Readings from Last 3 Encounters:  08/29/20 124/80  08/06/20 132/76  02/24/20 122/75   Pulse Readings from Last 3 Encounters:  08/29/20 95  08/06/20 86  02/24/20 99    Physical Exam Vitals and nursing note reviewed.  Constitutional:      Appearance: Normal appearance. She is well-developed and well-groomed. She is morbidly obese.  HENT:     Head: Normocephalic and atraumatic.  Eyes:     Conjunctiva/sclera: Conjunctivae normal.     Pupils: Pupils are equal, round, and reactive to light.  Cardiovascular:     Rate and Rhythm: Normal rate and regular rhythm.     Heart sounds: Normal heart sounds. No murmur heard.   Pulmonary:     Effort: Pulmonary effort is normal.     Breath sounds: Normal breath sounds.  Skin:    General: Skin is warm and dry.  Neurological:     General: No focal deficit present.     Mental Status: She is alert and oriented to person, place, and  time. Mental status is at baseline.     Gait: Gait normal.  Psychiatric:        Attention and Perception: Attention and perception normal.        Mood and Affect: Mood and affect normal.        Speech: Speech normal.        Behavior: Behavior normal. Behavior is cooperative.        Thought Content: Thought content normal.        Cognition and Memory: Cognition and memory normal.        Judgment: Judgment normal.     Assessment  Plan  Prediabetes 6.2 08/2020 A1C diabetes resolved Morbid obesity ([place])  Hyperlipidemia, unspecified hyperlipidemia type rec healthy diet and exercise   Herpes simplex type 2 infection rec safe sex practices currently she is not sexually active   Vitamin D deficiency rec 4000 IU qd   Systemic lupus erythematosus, unspecified SLE type, unspecified organ involvement status ([place]) Discoid lupus Cont rheum f/u on plaquenil 200 mg bid rec yearly eye exam and appt upcoming   Other pulmonary embolism without acute cor pulmonale, unspecified chronicity (HCC) Deep vein thrombosis (DVT) of lower extremity, unspecified chronicity, unspecified laterality, unspecified vein (HCC) -cont compliance with eliquis 5 mg bid she is now taking if qd  sch 09/03/20 f/u Dr Tasia Catchings further refills  HM Fasting labs had 08/2020 [facility] Flu shot 08/13/20   Per pt Tdap 2018 Consider pna 23 vxsmoker  covid shot 2/2 utd consider booster after 03/04/20 in 6-8 months  mammo had [facility]/13/18 [facility]  -  will need repeat mammogram in future Ordered [place] did not get at work this year 2021 As of 08/29/20 pt trying to schedule   PapDr Glennon Mac 05/10/18 neg pap neg HPV + trichomonas   Colonoscopy due age 33 disc with patient wants to wait as of 08/29/20   rec healthy diet and exercise and smoking cessatoin    Provider: Dr. Olivia Mackie McLean-Scocuzza-Internal Medicine

## 2020-08-29 NOTE — Telephone Encounter (Signed)
Please schedule patient for MD follow up and notify her of appt. Thanks.

## 2020-09-01 ENCOUNTER — Telehealth: Payer: Self-pay

## 2020-09-01 DIAGNOSIS — R7303 Prediabetes: Secondary | ICD-10-CM | POA: Insufficient documentation

## 2020-09-01 DIAGNOSIS — B009 Herpesviral infection, unspecified: Secondary | ICD-10-CM | POA: Insufficient documentation

## 2020-09-01 NOTE — Telephone Encounter (Signed)
LVM to schedule pneu 23 if pt wants

## 2020-09-03 ENCOUNTER — Other Ambulatory Visit: Payer: Self-pay

## 2020-09-03 ENCOUNTER — Inpatient Hospital Stay: Payer: BC Managed Care – PPO | Attending: Oncology | Admitting: Oncology

## 2020-09-03 ENCOUNTER — Encounter: Payer: Self-pay | Admitting: Oncology

## 2020-09-03 VITALS — BP 131/84 | HR 87 | Temp 97.8°F | Resp 18 | Wt 235.2 lb

## 2020-09-03 DIAGNOSIS — Z7901 Long term (current) use of anticoagulants: Secondary | ICD-10-CM

## 2020-09-03 DIAGNOSIS — L930 Discoid lupus erythematosus: Secondary | ICD-10-CM | POA: Insufficient documentation

## 2020-09-03 DIAGNOSIS — Z79899 Other long term (current) drug therapy: Secondary | ICD-10-CM | POA: Diagnosis not present

## 2020-09-03 DIAGNOSIS — Z86718 Personal history of other venous thrombosis and embolism: Secondary | ICD-10-CM | POA: Diagnosis not present

## 2020-09-03 DIAGNOSIS — E119 Type 2 diabetes mellitus without complications: Secondary | ICD-10-CM | POA: Diagnosis not present

## 2020-09-03 DIAGNOSIS — F419 Anxiety disorder, unspecified: Secondary | ICD-10-CM | POA: Insufficient documentation

## 2020-09-03 DIAGNOSIS — F17210 Nicotine dependence, cigarettes, uncomplicated: Secondary | ICD-10-CM | POA: Diagnosis not present

## 2020-09-03 DIAGNOSIS — Z86711 Personal history of pulmonary embolism: Secondary | ICD-10-CM | POA: Insufficient documentation

## 2020-09-03 HISTORY — DX: Personal history of other venous thrombosis and embolism: Z86.718

## 2020-09-03 MED ORDER — APIXABAN 2.5 MG PO TABS: 2.5000 mg | ORAL_TABLET | Freq: Two times a day (BID) | ORAL | 1 refills | Status: DC

## 2020-09-03 NOTE — Progress Notes (Signed)
Hematology/Oncology Consult note [facility] [phone] [phone]   Patient Care Team: McLean-Scocuzza, Nino Glow, MD as PCP - General (Internal Medicine)  REFERRING PROVIDER: McLean-Scocuzza, Nino Glow, MD CHIEF COMPLAINTS/PURPOSE OF CONSULTATION:  Follow up for DVT/PE  HISTORY OF PRESENTING ILLNESS:  Melinda Ross is a  49 y.o.  female with PMH listed below who was referred to me for evaluation of DVT/PE In January 2019, patient had a unprovoked distal left lower extremity DVT and bilateral PE.  Denies any triggering factors including car trip, airflight, recent injury/surgery, or any other immobilization factors.  Denies takin oral contraceptives.  She is on her feet all day long for her work. Patient was started on IV heparin drip at discharge switch to Eliquis.  Patient currently takes Eliquis 5 mg twice daily.Denies hematochezia, hematuria, hematemesis, epistaxis, black tarry stool or easy bruising.   She has irregular and heavy menstrual periods Patient has a history of discoid lupus, follows up with rheumatology Dr. Jefm Bryant.  On Plaquenil. She has had factor V Leiden mutation tested negative, protein C and protein S panel normal.  Family history: Mother had lung cancer.  No other family history of cancer mother's side.  She does not know her father's side Denies any weight loss, fatigue, night sweats, fever or chills.  INTERVAL HISTORY Melinda Ross is a 49 y.o. female who has above history reviewed by me today presents for follow up visit for management of chronic anticoagulation for history of DVT and PE.  Problems and complaints are listed below: She was last seen by me on 03/24/2018 and lost follow up afterwards.  Previous hypercoagulable work up showed normal protein S level and activity, protein C activity, negative factor V leiden mutation, negative prothrombin mutation.   She has been on Eliquis 63m BID. Tolerates well. No  bleeding events.  Denies lower extremity swelling, shortness of breath, chest pain.   Review of Systems  Constitutional: Negative for chills, fever, malaise/fatigue and weight loss.  HENT: Negative for congestion, ear discharge, ear pain, nosebleeds, sinus pain and sore throat.   Eyes: Negative for double vision, photophobia, pain, discharge and redness.  Respiratory: Negative for cough, hemoptysis, sputum production, shortness of breath and wheezing.   Cardiovascular: Negative for chest pain, palpitations, orthopnea, claudication and leg swelling.  Gastrointestinal: Negative for abdominal pain, blood in stool, constipation, diarrhea, heartburn, melena, nausea and vomiting.  Genitourinary: Negative for dysuria, flank pain, frequency and hematuria.  Musculoskeletal: Negative for back pain, myalgias and neck pain.  Skin: Negative for itching and rash.  Neurological: Negative for dizziness, tingling, tremors, focal weakness, weakness and headaches.  Endo/Heme/Allergies: Negative for environmental allergies. Does not bruise/bleed easily.  Psychiatric/Behavioral: Negative for depression and hallucinations. The patient is not nervous/anxious.     MEDICAL HISTORY:  Past Medical History:  Diagnosis Date  . Anemia   . Anxiety   . Attention deficit disorder (ADD)   . Collagen vascular disease (HCC)    lupus  . Diabetes mellitus without complication ([place]   . DVT of leg (deep venous thrombosis) ([place]   . Genital herpes 2015  . Hyperlipidemia   . Lupus ([place]    SEES DR WDuard BradyKERNODLE  Discoid  . Mild depression ([place]   . Numbness   . Pulmonary emboli ([place]   . UTI (urinary tract infection)     SURGICAL HISTORY: Past Surgical History:  Procedure Laterality Date  . ABLATION     2017 Dr. JLivingston Diones  .  CESAREAN SECTION  2003   [facility]  . DG TOES*L* Bilateral    2 bones removed in small toes 2002   . DILATATION & CURETTAGE/HYSTEROSCOPY WITH MYOSURE N/A 12/24/2016    Procedure: [place];  Surgeon: Malachy Mood, MD;  Location: [facility] OR[facility];  Service: Gynecology;  Laterality: N/A;  . DILATION AND CURETTAGE OF UTERUS    . HYSTEROSCOPY WITH NOVASURE N/A 03/25/2017   Procedure: HYSTEROSCOPY WITH NOVASURE;  Surgeon: Malachy Mood, MD;  Location: [facility] OR[facility];  Service: Gynecology;  Laterality: N/A;  . TUBAL LIGATION  2003   [facility]    SOCIAL HISTORY: Social History   Socioeconomic History  . Marital status: Single    Spouse name: Not on file  . Number of children: 3  . Years of education: Not on file  . Highest education level: Not on file  Occupational History    Comment: [employer]  Tobacco Use  . Smoking status: Current Every Day Smoker    Packs/day: 1.00    Years: 30.00    Pack years: 30.00    Types: Cigarettes  . Smokeless tobacco: Never Used  Vaping Use  . Vaping Use: Never used  Substance and Sexual Activity  . Alcohol use: Yes    Comment: social - once every 2-3 months   . Drug use: No  . Sexual activity: Yes    Birth control/protection: Surgical  Other Topics Concern  . Not on file  Social History Narrative   Patient is single and lives at home with her daughter. Patient works at [employer]. Patient has three children.   Right handed.    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 Difficulty of Paying Living Expenses: Not on file  Food Insecurity:   . Worried About Charity fundraiser in the Last Year: Not on file  . Ran Out of Food in the Last Year: Not on file  Transportation Needs:   . Lack of Transportation (Medical): Not on file  . Lack of Transportation (Non-Medical): Not on file  Physical Activity:   . Days of Exercise per Week: Not on file  . Minutes of Exercise per Session: Not on file  Stress:   . Feeling of Stress : Not on file  Social  Connections:   . Frequency of Communication with Friends and Family: Not on file  . Frequency of Social Gatherings with Friends and Family: Not on file  . Attends Religious Services: Not on file  . Active Member of Clubs or Organizations: Not on file  . Attends Archivist Meetings: Not on file  . Marital Status: Not on file  Intimate Partner Violence:   . Fear of Current or Ex-Partner: Not on file  . Emotionally Abused: Not on file  . Physically Abused: Not on file  . Sexually Abused: Not on file    FAMILY HISTORY: Family History  Problem Relation Age of Onset  . High blood pressure Brother   . Diabetes Brother   . Kidney disease Brother   . Arthritis Brother   . Birth defects Brother   . Lung cancer Mother   . COPD Mother   . Heart attack Mother   . Hearing loss Father   . Heart disease Father   . Hypertension Father   .  Heart disease Sister   . Hypertension Sister   . Depression Daughter   . Diabetes Maternal Aunt   . Other Maternal Aunt   . Kidney failure Maternal Aunt     ALLERGIES:  has No Known Allergies.  MEDICATIONS:  Current Outpatient Medications  Medication Sig Dispense Refill  . blood glucose meter kit and supplies Dispense based on patient and insurance preference. Use up to four times daily as directed. (FOR ICD-10 E10.9, E11.9). 1 each 0  . Cholecalciferol (VITAMIN D3 PO) Take by mouth.    . ferrous sulfate 325 (65 FE) MG tablet Take 325 mg by mouth daily with breakfast.    . glucose blood test strip Use as instructed check tid E 11.9 300 each 12  . hydroxychloroquine (PLAQUENIL) 200 MG tablet Take 200 mg by mouth 2 (two) times daily.    . Multiple Vitamin (MULTIVITAMIN ADULT PO) Take by mouth.    Marland Kitchen apixaban (ELIQUIS) 2.5 MG TABS tablet Take 1 tablet (2.5 mg total) by mouth 2 (two) times daily. 180 tablet 1   No current facility-administered medications for this visit.     PHYSICAL EXAMINATION: ECOG PERFORMANCE STATUS: 0 -  Asymptomatic Vitals:   09/03/20 1258  BP: 131/84  Pulse: 87  Resp: 18  Temp: 97.8 F (36.6 C)   Filed Weights   09/03/20 1258  Weight: 235 lb 3.2 oz (106.7 kg)    Physical Exam Constitutional:      General: She is not in acute distress.    Appearance: She is well-developed.  HENT:     Head: Normocephalic and atraumatic.     Right Ear: External ear normal.     Left Ear: External ear normal.  Eyes:     General: No scleral icterus.    Conjunctiva/sclera: Conjunctivae normal.     Pupils: Pupils are equal, round, and reactive to light.  Cardiovascular:     Rate and Rhythm: Normal rate and regular rhythm.     Heart sounds: Normal heart sounds.  Pulmonary:     Effort: Pulmonary effort is normal. No respiratory distress.     Breath sounds: Normal breath sounds. No wheezing or rales.  Chest:     Chest wall: No tenderness.  Abdominal:     General: Bowel sounds are normal. There is no distension.     Palpations: Abdomen is soft. There is no mass.     Tenderness: There is no abdominal tenderness.  Musculoskeletal:        General: No deformity. Normal range of motion.     Cervical back: Normal range of motion and neck supple.  Lymphadenopathy:     Cervical: No cervical adenopathy.  Skin:    General: Skin is warm and dry.     Findings: No rash.  Neurological:     Mental Status: She is alert and oriented to person, place, and time.     Cranial Nerves: No cranial nerve deficit.     Coordination: Coordination normal.  Psychiatric:        Behavior: Behavior normal.        Thought Content: Thought content normal.      LABORATORY DATA:  I have reviewed the data as listed Lab Results  Component Value Date   WBC 4.3 05/06/2018   HGB 13.4 05/06/2018   HCT 39.2 05/06/2018   MCV 94.1 05/06/2018   PLT 205 05/06/2018   No results for input(s): NA, K, CL, CO2, GLUCOSE, BUN, CREATININE, CALCIUM, GFRNONAA, GFRAA, PROT, ALBUMIN, AST,  ALT, ALKPHOS, BILITOT, BILIDIR, IBILI in the  last 8760 hours.  # 06/26/2020 CBC showed hemoglobin of 13.1, creatinine 0.9   RADIOGRAPHIC STUDIES: I have personally reviewed the radiological images as listed and agreed with the findings in the report. 12/16/2017 CT Angio chest PE 1. Positive for small bilateral segmental and subsegmental pulmonary emboli involving the upper and lower lobes. 2. No focal pulmonary airspace disease is visualized 3. 15 mm left lobe of thyroid hypodense nodule, nonemergent thyroid ultrasound evaluation as indicated 4. 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  0/30/0923 US venous lower extremity Occlusive thrombus in the peroneal veins of the calf. The examination is otherwise negative  ASSESSMENT & PLAN:  1. Personal history of venous thrombosis and embolism   2. Chronic anticoagulation    #Unprovoked lower extremity DVT and bilateral PE: Discussed with patient that I recommend long-term anticoagulation.  Recommend patient to switch to Elqiuis 2.75m BID as long term anticoagulation  Hypercoagulable work up negative. Will check antiphospholipid panel   .#recommend Age-appropriate cancer screening  All questions were answered. The patient knows to call the clinic with any problems questions or concerns.  Return of visit:  follow-up visit in 6 months.  ZEarlie Server MD, PhD Hematology Oncology [facility]ager- [phone]/13/2021

## 2020-09-03 NOTE — Progress Notes (Signed)
Patient denies new problems/concerns today.   °

## 2020-09-09 ENCOUNTER — Inpatient Hospital Stay: Payer: BC Managed Care – PPO

## 2020-09-12 ENCOUNTER — Inpatient Hospital Stay: Payer: BC Managed Care – PPO

## 2020-09-12 ENCOUNTER — Other Ambulatory Visit: Payer: Self-pay

## 2020-09-12 DIAGNOSIS — Z86718 Personal history of other venous thrombosis and embolism: Secondary | ICD-10-CM

## 2020-09-12 DIAGNOSIS — E119 Type 2 diabetes mellitus without complications: Secondary | ICD-10-CM | POA: Diagnosis not present

## 2020-09-12 DIAGNOSIS — Z86711 Personal history of pulmonary embolism: Secondary | ICD-10-CM | POA: Diagnosis not present

## 2020-09-12 DIAGNOSIS — F17210 Nicotine dependence, cigarettes, uncomplicated: Secondary | ICD-10-CM | POA: Diagnosis not present

## 2020-09-12 DIAGNOSIS — F419 Anxiety disorder, unspecified: Secondary | ICD-10-CM | POA: Diagnosis not present

## 2020-09-12 DIAGNOSIS — Z7901 Long term (current) use of anticoagulants: Secondary | ICD-10-CM | POA: Diagnosis not present

## 2020-09-12 DIAGNOSIS — L810 Postinflammatory hyperpigmentation: Secondary | ICD-10-CM | POA: Diagnosis not present

## 2020-09-12 DIAGNOSIS — Z79899 Other long term (current) drug therapy: Secondary | ICD-10-CM | POA: Diagnosis not present

## 2020-09-12 DIAGNOSIS — L930 Discoid lupus erythematosus: Secondary | ICD-10-CM | POA: Diagnosis not present

## 2020-09-16 LAB — ANTIPHOSPHOLIPID SYNDROME PROF
Anticardiolipin IgG: <9 GPL U/mL (ref 0–14)
Anticardiolipin IgM: 12 MPL U/mL (ref 0–12)
DRVVT: 35.5 s (ref 0.0–47.0)
PTT Lupus Anticoagulant: 36.4 s (ref 0.0–51.9)

## 2020-09-18 DIAGNOSIS — E119 Type 2 diabetes mellitus without complications: Secondary | ICD-10-CM | POA: Diagnosis not present

## 2020-09-18 LAB — HM DIABETES EYE EXAM

## 2020-09-21 ENCOUNTER — Encounter: Payer: Self-pay | Admitting: Internal Medicine

## 2020-11-18 DIAGNOSIS — M1712 Unilateral primary osteoarthritis, left knee: Secondary | ICD-10-CM | POA: Diagnosis not present

## 2020-11-18 DIAGNOSIS — M25562 Pain in left knee: Secondary | ICD-10-CM | POA: Diagnosis not present

## 2020-11-18 DIAGNOSIS — M329 Systemic lupus erythematosus, unspecified: Secondary | ICD-10-CM | POA: Diagnosis not present

## 2020-12-11 DIAGNOSIS — Z1152 Encounter for screening for COVID-19: Secondary | ICD-10-CM | POA: Diagnosis not present

## 2020-12-27 DIAGNOSIS — M25562 Pain in left knee: Secondary | ICD-10-CM | POA: Diagnosis not present

## 2020-12-30 DIAGNOSIS — M13862 Other specified arthritis, left knee: Secondary | ICD-10-CM | POA: Diagnosis not present

## 2020-12-30 DIAGNOSIS — S86912A Strain of unspecified muscle(s) and tendon(s) at lower leg level, left leg, initial encounter: Secondary | ICD-10-CM | POA: Diagnosis not present

## 2021-01-16 DIAGNOSIS — M25562 Pain in left knee: Secondary | ICD-10-CM | POA: Diagnosis not present

## 2021-03-04 ENCOUNTER — Inpatient Hospital Stay: Payer: BC Managed Care – PPO | Admitting: Oncology

## 2021-03-04 ENCOUNTER — Inpatient Hospital Stay: Payer: BC Managed Care – PPO | Attending: Oncology

## 2021-03-16 DIAGNOSIS — M79609 Pain in unspecified limb: Secondary | ICD-10-CM | POA: Diagnosis not present

## 2021-06-19 DIAGNOSIS — Z79899 Other long term (current) drug therapy: Secondary | ICD-10-CM | POA: Diagnosis not present

## 2021-08-26 DIAGNOSIS — Z23 Encounter for immunization: Secondary | ICD-10-CM | POA: Diagnosis not present

## 2021-09-09 DIAGNOSIS — J069 Acute upper respiratory infection, unspecified: Secondary | ICD-10-CM | POA: Diagnosis not present

## 2021-09-25 ENCOUNTER — Emergency Department
Admission: EM | Admit: 2021-09-25 | Discharge: 2021-09-25 | Disposition: A | Discharge status: Home / Self Care | Payer: BC Managed Care – PPO | Attending: Emergency Medicine | Admitting: Emergency Medicine

## 2021-09-25 ENCOUNTER — Other Ambulatory Visit: Payer: Self-pay

## 2021-09-25 ENCOUNTER — Emergency Department: Payer: BC Managed Care – PPO

## 2021-09-25 DIAGNOSIS — E119 Type 2 diabetes mellitus without complications: Secondary | ICD-10-CM | POA: Diagnosis not present

## 2021-09-25 DIAGNOSIS — K219 Gastro-esophageal reflux disease without esophagitis: Secondary | ICD-10-CM | POA: Diagnosis not present

## 2021-09-25 DIAGNOSIS — R1013 Epigastric pain: Secondary | ICD-10-CM

## 2021-09-25 DIAGNOSIS — R110 Nausea: Secondary | ICD-10-CM | POA: Diagnosis not present

## 2021-09-25 DIAGNOSIS — Z7901 Long term (current) use of anticoagulants: Secondary | ICD-10-CM | POA: Diagnosis not present

## 2021-09-25 DIAGNOSIS — F17210 Nicotine dependence, cigarettes, uncomplicated: Secondary | ICD-10-CM | POA: Insufficient documentation

## 2021-09-25 DIAGNOSIS — R519 Headache, unspecified: Secondary | ICD-10-CM | POA: Diagnosis not present

## 2021-09-25 LAB — BASIC METABOLIC PANEL
Anion gap: 8 (ref 5–15)
BUN: 10 mg/dL (ref 6–20)
CO2: 21 mmol/L — ABNORMAL LOW (ref 22–32)
Calcium: 9.1 mg/dL (ref 8.9–10.3)
Chloride: 109 mmol/L (ref 98–111)
Creatinine, Ser: 0.74 mg/dL (ref 0.44–1.00)
GFR, Estimated: >60 mL/min (ref >60)
Glucose, Bld: 107 mg/dL — ABNORMAL HIGH (ref 70–99)
Potassium: 3.3 mmol/L — ABNORMAL LOW (ref 3.5–5.1)
Sodium: 138 mmol/L (ref 135–145)

## 2021-09-25 LAB — TROPONIN I (HIGH SENSITIVITY)
Troponin I (High Sensitivity): 3 ng/L (ref <18)
Troponin I (High Sensitivity): 3 ng/L (ref <18)

## 2021-09-25 LAB — CBC
HCT: 39.1 % (ref 36.0–46.0)
Hemoglobin: 14 g/dL (ref 12.0–15.0)
MCH: 34 pg (ref 26.0–34.0)
MCHC: 35.8 g/dL (ref 30.0–36.0)
MCV: 94.9 fL (ref 80.0–100.0)
Platelets: 211 10*3/uL (ref 150–400)
RBC: 4.12 MIL/uL (ref 3.87–5.11)
RDW: 12.8 % (ref 11.5–15.5)
WBC: 3.8 10*3/uL — ABNORMAL LOW (ref 4.0–10.5)
nRBC: 0 % (ref 0.0–0.2)

## 2021-09-25 LAB — HEPATIC FUNCTION PANEL
ALT: 19 U/L (ref 0–44)
AST: 27 U/L (ref 15–41)
Albumin: 3.6 g/dL (ref 3.5–5.0)
Alkaline Phosphatase: 59 U/L (ref 38–126)
Bilirubin, Direct: 0.1 mg/dL (ref 0.0–0.2)
Indirect Bilirubin: 1 mg/dL — ABNORMAL HIGH (ref 0.3–0.9)
Total Bilirubin: 1.1 mg/dL (ref 0.3–1.2)
Total Protein: 6.6 g/dL (ref 6.5–8.1)

## 2021-09-25 LAB — LIPASE, BLOOD: Lipase: 34 U/L (ref 11–51)

## 2021-09-25 MED ORDER — ALUM & MAG HYDROXIDE-SIMETH 200-200-20 MG/5ML PO SUSP
30.0000 mL | Freq: Once | ORAL | Status: AC
  Administered 2021-09-25: 30 mL via ORAL
  Filled 2021-09-25: qty 30

## 2021-09-25 MED ORDER — FAMOTIDINE 20 MG PO TABS: 20.0000 mg | ORAL_TABLET | Freq: Every day | ORAL | 1 refills | Status: DC

## 2021-09-25 MED ORDER — SUCRALFATE 1 G PO TABS: 1.0000 g | ORAL_TABLET | Freq: Four times a day (QID) | ORAL | 0 refills | Status: DC

## 2021-09-25 MED ORDER — LIDOCAINE VISCOUS HCL 2 % MT SOLN
15.0000 mL | Freq: Once | OROMUCOSAL | Status: AC
  Administered 2021-09-25: 15 mL via ORAL
  Filled 2021-09-25: qty 15

## 2021-09-25 NOTE — ED Notes (Signed)
Dc ppw provided for patient. Education and followup reviewed. Pt assisted off floor on foot. Verbal consent for DC.

## 2021-09-25 NOTE — ED Provider Notes (Signed)
[facility] Emergency Department Provider Note    ____________________________________________   I have reviewed the triage vital signs and the nursing notes.   HISTORY  Chief Complaint Epigastric Pain   History limited by: Not Limited   HPI Melinda Ross is a 50 y.o. female who presents to the emergency department today because of concerns for epigastric pain.  Patient states the pain started yesterday while she was at work.  She was not doing any unusual exertion for her.  It is located in the epigastric region and does not radiate.  It described as sharp.  It does come and go in waves.  It is severe when it comes.  The patient states that it does somewhat remind her of when she took too many ibuprofen once in the past.  She has had some nausea with this.  She denies any recent changes in her bowel movements.  Denies any fevers.  Records reviewed. 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   Mild depression    Numbness    Pulmonary emboli (HCC)    UTI (urinary tract infection)     Patient Active Problem List   Diagnosis Date Noted   Personal history of venous thrombosis and embolism 09/03/2020   Herpes simplex type 2 infection 09/01/2020   Prediabetes 09/01/2020   Multinodular goiter 08/18/2020   Morbid obesity with BMI of 40.0-44.9, adult ([place]) 08/18/2020   Annual physical exam 08/06/2020   Hyperlipidemia 04/26/2018   Vitamin D deficiency 04/26/2018   Ovarian cyst, left 03/20/2018   Irregular menses 03/20/2018   Low back pain 03/20/2018   Fibroid 03/20/2018   Thyroid nodule 03/20/2018   SLE (systemic lupus  erythematosus) ([place]) 03/20/2018   Tobacco abuse 03/20/2018   DVT (deep venous thrombosis) ([place]) 01/09/2018   Pulmonary emboli ([place]) 12/16/2017   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 Livingston Diones    CESAREAN SECTION  2003   [facility]   DG TOES*L* Bilateral    2 bones removed in small toes 2002    [place] N/A 12/24/2016   Procedure: [place];  Surgeon: Malachy Mood, MD;  Location: [facility] ORS;  Service: Gynecology;  Laterality: N/A;   DILATION AND CURETTAGE OF UTERUS     HYSTEROSCOPY WITH NOVASURE N/A 03/25/2017   Procedure: HYSTEROSCOPY WITH NOVASURE;  Surgeon: Malachy Mood, MD;  Location: [facility] ORS;  Service: Gynecology;  Laterality: N/A;   TUBAL LIGATION  2003   [facility]    Prior to Admission medications   Medication Sig Start Date End Date Taking? Authorizing Provider  apixaban (ELIQUIS) 2.5 MG TABS tablet Take 1 tablet (2.5 mg total) by mouth 2 (two) times daily. 09/03/20   Earlie Server, MD  blood glucose meter kit and supplies Dispense based on patient and insurance preference. Use up to four times daily as directed. (FOR ICD-10 E10.9, E11.9). 04/04/19   McLean-Scocuzza, Nino Glow, MD  Cholecalciferol (  VITAMIN D3 PO) Take by mouth.    [provider]  ferrous sulfate 325 (65 FE) MG tablet Take 325 mg by mouth daily with breakfast.    [provider]  glucose blood test strip Use as instructed check tid E 11.9 04/30/19   McLean-Scocuzza, Nino Glow, MD  hydroxychloroquine (PLAQUENIL) 200 MG tablet Take 200 mg by mouth 2 (two) times daily.    [provider]  Multiple Vitamin (MULTIVITAMIN ADULT PO) Take by mouth.    [provider]    Allergies Patient has no known allergies.  Family History  Problem Relation Age of Onset   High blood pressure Brother    Diabetes Brother     Kidney disease Brother    Arthritis Brother    Birth defects Brother    Lung cancer Mother    COPD Mother    Heart attack Mother    Hearing loss Father    Heart disease Father    Hypertension Father    Heart disease Sister    Hypertension Sister    Depression Daughter    Diabetes Maternal Aunt    Other Maternal Aunt    Kidney failure Maternal Aunt     Social History Social History   Tobacco Use   Smoking status: Every Day    Packs/day: 1.00    Years: 30.00    Pack years: 30.00    Types: Cigarettes   Smokeless tobacco: Never  Vaping Use   Vaping Use: Never used  Substance Use Topics   Alcohol use: Yes    Comment: social - once every 2-3 months    Drug use: No    Review of Systems Constitutional: No fever/chills Eyes: No visual changes. ENT: No sore throat. Cardiovascular: Denies chest pain. Respiratory: Denies shortness of breath. Gastrointestinal: Positive for epigastric pain and nausea.  Genitourinary: Negative for dysuria. Musculoskeletal: Negative for back pain. Skin: Negative for rash. Neurological: Negative for headaches, focal weakness or numbness.  ____________________________________________   PHYSICAL EXAM:  VITAL SIGNS: ED Triage Vitals  Enc Vitals Group     BP 09/25/21 1311 119/83     Pulse Rate 09/25/21 1311 77     Resp 09/25/21 1311 18     Temp 09/25/21 1311 98 F (36.7 C)     Temp src --      SpO2 09/25/21 1311 96 %     Weight --      Height --      Head Circumference --      Peak Flow --      Pain Score 09/25/21 1259 8   Constitutional: Alert and oriented.  Eyes: Conjunctivae are normal.  ENT      Head: Normocephalic and atraumatic.      Nose: No congestion/rhinnorhea.      Mouth/Throat: Mucous membranes are moist.      Neck: No stridor. Hematological/Lymphatic/Immunilogical: No cervical lymphadenopathy. Cardiovascular: Normal rate, regular rhythm.  No murmurs, rubs, or gallops.  Respiratory: Normal respiratory effort  without tachypnea nor retractions. Breath sounds are clear and equal bilaterally. No wheezes/rales/rhonchi. Gastrointestinal: Soft and tender in the epigastric region. No RUQ tenderness.  Genitourinary: Deferred Musculoskeletal: Normal range of motion in all extremities. No lower extremity edema. Neurologic:  Normal speech and language. No gross focal neurologic deficits are appreciated.  Skin:  Skin is warm, dry and intact. No rash noted. Psychiatric: Mood and affect are normal. Speech and behavior are normal. Patient exhibits appropriate insight and judgment.  ____________________________________________  LABS (pertinent positives/negatives)  Trop hs 3 CBC wbc 3.8, hgb 14.0, plt 211 BMP wnl except k 3.3, co2 21, glu 107  ____________________________________________   EKG  I, Nance Pear, attending physician, personally viewed and interpreted this EKG  EKG Time: 1312 Rate: 81 Rhythm: normal sinus rhythm Axis: normal Intervals: qtc 453 QRS: low voltage ST changes: no st elevation Impression: abnormal ekg  ____________________________________________    RADIOLOGY  CXR No active cardiopulmonary disease  ____________________________________________   PROCEDURES  Procedures  ____________________________________________   INITIAL IMPRESSION / ASSESSMENT AND PLAN / ED COURSE  Pertinent labs & imaging results that were available during my care of the patient were reviewed by me and considered in my medical decision making (see chart for details).   Patient presented to the emergency department today because of concerns for epigastric pain.  Work-up here without any elevation of troponin.  Lipase and hepatic function panels without any concerning elevations.  Have lower suspicion for gallbladder disease given lack of tenderness in the right upper quadrant.  Additionally I do not think patient suffering from any aortic issue given the nature of the clinical  history.  This time I do think ulcer likely.  Discussed this with the patient.  Will plan on discharging with medication to help with ulcers.   ____________________________________________   FINAL CLINICAL IMPRESSION(S) / ED DIAGNOSES  Final diagnoses:  Epigastric pain  Gastroesophageal reflux disease, unspecified whether esophagitis present     Note: This dictation was prepared with Dragon dictation. Any transcriptional errors that result from this process are unintentional     Nance Pear, MD 09/25/21 1754

## 2021-09-25 NOTE — Discharge Instructions (Signed)
Please seek medical attention for any high fevers, chest pain, shortness of breath, change in behavior, persistent vomiting, bloody stool or any other new or concerning symptoms.  

## 2021-09-25 NOTE — ED Triage Notes (Signed)
Pt comes from [facility] with c/o upper epigastric and CP. Pt states nausea and headache.

## 2021-09-25 NOTE — ED Notes (Signed)
Pt unable to finish Gi cocktail. Pt resting in bed, endorsing epigastric pain

## 2021-09-27 ENCOUNTER — Encounter: Payer: Self-pay | Admitting: Internal Medicine

## 2021-09-28 ENCOUNTER — Telehealth: Payer: Self-pay | Admitting: Internal Medicine

## 2021-09-28 NOTE — Telephone Encounter (Signed)
Please advise on possible alternative to back ordered medication

## 2021-09-28 NOTE — Telephone Encounter (Signed)
Left message to return call. Will send mychart message as well

## 2021-09-28 NOTE — Telephone Encounter (Signed)
See 09/27/21 Patient message encounter

## 2021-09-28 NOTE — Telephone Encounter (Signed)
Pt needs to get in contact w the ER who saw her for this Sch appt with me as follow up asap

## 2021-10-28 DIAGNOSIS — Z0189 Encounter for other specified special examinations: Secondary | ICD-10-CM | POA: Diagnosis not present

## 2021-12-15 ENCOUNTER — Other Ambulatory Visit: Payer: Self-pay | Admitting: Internal Medicine

## 2021-12-15 DIAGNOSIS — Z139 Encounter for screening, unspecified: Secondary | ICD-10-CM

## 2021-12-16 ENCOUNTER — Other Ambulatory Visit: Payer: Self-pay

## 2021-12-16 ENCOUNTER — Ambulatory Visit
Admission: RE | Admit: 2021-12-16 | Discharge: 2021-12-16 | Disposition: A | Discharge status: Home / Self Care | Payer: BC Managed Care – PPO | Source: Ambulatory Visit | Attending: Internal Medicine | Admitting: Internal Medicine

## 2021-12-16 DIAGNOSIS — Z1231 Encounter for screening mammogram for malignant neoplasm of breast: Secondary | ICD-10-CM | POA: Diagnosis not present

## 2021-12-16 DIAGNOSIS — Z139 Encounter for screening, unspecified: Secondary | ICD-10-CM

## 2022-02-15 DIAGNOSIS — R197 Diarrhea, unspecified: Secondary | ICD-10-CM | POA: Diagnosis not present

## 2022-03-09 DIAGNOSIS — M1712 Unilateral primary osteoarthritis, left knee: Secondary | ICD-10-CM | POA: Diagnosis not present

## 2022-03-09 DIAGNOSIS — M329 Systemic lupus erythematosus, unspecified: Secondary | ICD-10-CM | POA: Diagnosis not present

## 2022-03-09 DIAGNOSIS — Z7960 Long term (current) use of unspecified immunomodulators and immunosuppressants: Secondary | ICD-10-CM | POA: Diagnosis not present

## 2022-04-27 DIAGNOSIS — Z0189 Encounter for other specified special examinations: Secondary | ICD-10-CM | POA: Diagnosis not present

## 2022-04-29 ENCOUNTER — Ambulatory Visit: Payer: BC Managed Care – PPO | Admitting: Internal Medicine

## 2022-04-29 ENCOUNTER — Encounter: Payer: Self-pay | Admitting: Internal Medicine

## 2022-04-29 VITALS — BP 110/70 | HR 96 | Temp 98.0°F | Resp 14 | Ht 63.0 in | Wt 230.0 lb

## 2022-04-29 DIAGNOSIS — E559 Vitamin D deficiency, unspecified: Secondary | ICD-10-CM

## 2022-04-29 DIAGNOSIS — Z6841 Body Mass Index (BMI) 40.0 and over, adult: Secondary | ICD-10-CM | POA: Insufficient documentation

## 2022-04-29 DIAGNOSIS — E785 Hyperlipidemia, unspecified: Secondary | ICD-10-CM

## 2022-04-29 DIAGNOSIS — B3731 Acute candidiasis of vulva and vagina: Secondary | ICD-10-CM

## 2022-04-29 DIAGNOSIS — E1165 Type 2 diabetes mellitus with hyperglycemia: Secondary | ICD-10-CM

## 2022-04-29 DIAGNOSIS — Z1211 Encounter for screening for malignant neoplasm of colon: Secondary | ICD-10-CM | POA: Diagnosis not present

## 2022-04-29 DIAGNOSIS — Z23 Encounter for immunization: Secondary | ICD-10-CM | POA: Diagnosis not present

## 2022-04-29 DIAGNOSIS — Z124 Encounter for screening for malignant neoplasm of cervix: Secondary | ICD-10-CM

## 2022-04-29 DIAGNOSIS — N3000 Acute cystitis without hematuria: Secondary | ICD-10-CM

## 2022-04-29 MED ORDER — OZEMPIC (0.25 OR 0.5 MG/DOSE) 2 MG/3ML ~~LOC~~ SOPN: 0.5000 mg | PEN_INJECTOR | SUBCUTANEOUS | 0 refills | Status: DC

## 2022-04-29 MED ORDER — FLUCONAZOLE 150 MG PO TABS: 150.0000 mg | ORAL_TABLET | Freq: Once | ORAL | 0 refills | Status: AC

## 2022-04-29 MED ORDER — ATORVASTATIN CALCIUM 10 MG PO TABS: 10.0000 mg | ORAL_TABLET | Freq: Every day | ORAL | 3 refills | Status: DC

## 2022-04-29 MED ORDER — SEMAGLUTIDE(0.25 OR 0.5MG/DOS) 2 MG/3ML ~~LOC~~ SOPN: 0.2500 mg | PEN_INJECTOR | SUBCUTANEOUS | 0 refills | Status: DC

## 2022-04-29 NOTE — Patient Instructions (Addendum)
Call your job and schedule nutrition appointment at your job A1c 10.4 Add lipitor 10 mg for cholesterol  Dr. Wallace Going, [place], [place] call for appt   Dr.Jackson pap due 05/10/22  Phone Fax E-mail Address  [phone] Not available [place] [address]     Specialties     Obstetrics and Gynecology      [facility] GI colonoscopy  Phone Fax E-mail Address  [phone] Not available [place] [address]     Specialties     Gastroenterology        Colonoscopy, Adult A colonoscopy is a procedure to look at the entire large intestine. This procedure is done using a long, thin, flexible tube that has a camera on the end. You may have a colonoscopy: As a part of normal colorectal screening. If you have certain symptoms, such as: A low number of red blood cells in your blood (anemia). Diarrhea that does not go away. Pain in your abdomen. Blood in your stool. A colonoscopy can help screen for and diagnose medical problems, including: An abnormal growth of cells or tissue (tumor). Abnormal growths within the lining of your intestine (polyps). Inflammation. Areas of bleeding. Tell your health care provider about: Any allergies you have. All medicines you are taking, including vitamins, herbs, eye drops, creams, and over-the-counter medicines. Any problems you or family members have had with anesthetic medicines. Any bleeding problems you have. Any surgeries you have had. Any medical conditions you have. Any problems you have had with having bowel movements. Whether you are pregnant or may be pregnant. What are the risks? Generally, this is a safe procedure. However, problems may occur, including: Bleeding. Damage to your intestine. Allergic reactions to medicines given during the procedure. Infection. This is rare. What happens before the procedure? Eating and drinking restrictions Follow  instructions from your health care provider about eating or drinking restrictions, which may include: A few days before the procedure: Follow a low-fiber diet. Avoid nuts, seeds, dried fruit, raw fruits, and vegetables. 1-3 days before the procedure: Eat only gelatin dessert or ice pops. Drink only clear liquids, such as water, clear juice, clear broth or bouillon, black coffee or tea, or clear soft drinks or sports drinks. Avoid liquids that contain red or purple dye. The day of the procedure: Do not eat solid foods. You may continue to drink clear liquids until up to 2 hours before the procedure. Do not eat or drink anything starting 2 hours before the procedure, or within the time period that your health care provider recommends. Bowel prep If you were prescribed a bowel prep to take by mouth (orally) to clean out your colon: Take it as told by your health care provider. Starting the day before your procedure, you will need to drink a large amount of liquid medicine. The liquid will cause you to have many bowel movements of loose stool until your stool becomes almost clear or light green. If your skin or the opening between the buttocks (anus) gets irritated from diarrhea, you may relieve the irritation using: Wipes with medicine in them, such as adult wet wipes with aloe and vitamin E. A product to soothe skin, such as petroleum jelly. If you vomit while drinking the bowel prep: Take a break for up to 60 minutes. Begin the bowel prep again. Call your health care provider if you keep vomiting or you cannot take the bowel prep without  vomiting. To clean out your colon, you may also be given: Laxative medicines. These help you have a bowel movement. Instructions for enema use. An enema is liquid medicine injected into your rectum. Medicines Ask your health care provider about: Changing or stopping your regular medicines or supplements. This is especially important if you are taking iron  supplements, diabetes medicines, or blood thinners. Taking medicines such as aspirin and ibuprofen. These medicines can thin your blood. Do not take these medicines unless your health care provider tells you to take them. Taking over-the-counter medicines, vitamins, herbs, and supplements. General instructions Ask your health care provider what steps will be taken to help prevent infection. These may include washing skin with a germ-killing soap. If you will be going home right after the procedure, plan to have a responsible adult: Take you home from the hospital or clinic. You will not be allowed to drive. Care for you for the time you are told. What happens during the procedure?  An IV will be inserted into one of your veins. You will be given a medicine to make you fall asleep (general anesthetic). You will lie on your side with your knees bent. A lubricant will be put on the tube. Then the tube will be: Inserted into your anus. Gently eased through all parts of your large intestine. Air will be sent into your colon to keep it open. This may cause some pressure or cramping. Images will be taken with the camera and will appear on a screen. A small tissue sample may be removed to be looked at under a microscope (biopsy). The tissue may be sent to a lab for testing if any signs of problems are found. If small polyps are found, they may be removed and checked for cancer cells. When the procedure is finished, the tube will be removed. The procedure may vary among health care providers and hospitals. What happens after the procedure? Your blood pressure, heart rate, breathing rate, and blood oxygen level will be monitored until you leave the hospital or clinic. You may have a small amount of blood in your stool. You may pass gas and have mild cramping or bloating in your abdomen. This is caused by the air that was used to open your colon during the exam. If you were given a sedative during the  procedure, it can affect you for several hours. Do not drive or operate machinery until your health care provider says that it is safe. It is up to you to get the results of your procedure. Ask your health care provider, or the department that is doing the procedure, when your results will be ready. Summary A colonoscopy is a procedure to look at the entire large intestine. Follow instructions from your health care provider about eating and drinking before the procedure. If you were prescribed an oral bowel prep to clean out your colon, take it as told by your health care provider. During the colonoscopy, a flexible tube with a camera on its end is inserted into the anus and then passed into all parts of the large intestine. This information is not intended to replace advice given to you by your health care provider. Make sure you discuss any questions you have with your health care provider. Document Revised: 11/02/2021 Document Reviewed: 07/01/2021 Elsevier Patient Education  [place].   Vaginal Yeast Infection, Adult  Vaginal yeast infection is a condition that causes vaginal discharge as well as soreness, swelling, and redness (inflammation) of  the vagina. This is a common condition. Some women get this infection frequently. What are the causes? This condition is caused by a change in the normal balance of the yeast (Candida) and normal bacteria that live in the vagina. This change causes an overgrowth of yeast, which causes the inflammation. What increases the risk? The condition is more likely to develop in women who: Take antibiotic medicines. Have diabetes. Take birth control pills. Are pregnant. Douche often. Have a weak body defense system (immune system). Have been taking steroid medicines for a long time. Frequently wear tight clothing. What are the signs or symptoms? Symptoms of this condition include: White, thick, creamy vaginal discharge. Swelling, itching,  redness, and irritation of the vagina. The lips of the vagina (labia) may be affected as well. Pain or a burning feeling while urinating. Pain during sex. How is this diagnosed? This condition is diagnosed based on: Your medical history. A physical exam. A pelvic exam. Your health care provider will examine a sample of your vaginal discharge under a microscope. Your health care provider may send this sample for testing to confirm the diagnosis. How is this treated? This condition is treated with medicine. Medicines may be over-the-counter or prescription. You may be told to use one or more of the following: Medicine that is taken by mouth (orally). Medicine that is applied as a cream (topically). Medicine that is inserted directly into the vagina (suppository). Follow these instructions at home: Take or apply over-the-counter and prescription medicines only as told by your health care provider. Do not use tampons until your health care provider approves. Do not have sex until your infection has cleared. Sex can prolong or worsen your symptoms of infection. Ask your health care provider when it is safe to resume sexual activity. Keep all follow-up visits. This is important. How is this prevented?  Do not wear tight clothes, such as pantyhose or tight pants. Wear breathable cotton underwear. Do not use douches, perfumed soap, creams, or powders. Wipe from front to back after using the toilet. If you have diabetes, keep your blood sugar levels under control. Ask your health care provider for other ways to prevent yeast infections. Contact a health care provider if: You have a fever. Your symptoms go away and then return. Your symptoms do not get better with treatment. Your symptoms get worse. You have new symptoms. You develop blisters in or around your vagina. You have blood coming from your vagina and it is not your menstrual period. You develop pain in your  abdomen. Summary Vaginal yeast infection is a condition that causes discharge as well as soreness, swelling, and redness (inflammation) of the vagina. This condition is treated with medicine. Medicines may be over-the-counter or prescription. Take or apply over-the-counter and prescription medicines only as told by your health care provider. Do not douche. Resume sexual activity or use of tampons as instructed by your health care provider. Contact a health care provider if your symptoms do not get better with treatment or your symptoms go away and then return. This information is not intended to replace advice given to you by your health care provider. Make sure you discuss any questions you have with your health care provider. Document Revised: 01/26/2021 Document Reviewed: 01/26/2021 Elsevier Patient Education  [place].  High Cholesterol  High cholesterol is a condition in which the blood has high levels of a white, waxy substance similar to fat (cholesterol). The liver makes all the cholesterol that the body needs.  The human body needs small amounts of cholesterol to help build cells. A person gets extra or excess cholesterol from the food that he or she eats. The blood carries cholesterol from the liver to the rest of the body. If you have high cholesterol, deposits (plaques) may build up on the walls of your arteries. Arteries are the blood vessels that carry blood away from your heart. These plaques make the arteries narrow and stiff. Cholesterol plaques increase your risk for heart attack and stroke. Work with your health care provider to keep your cholesterol levels in a healthy range. What increases the risk? The following factors may make you more likely to develop this condition: Eating foods that are high in animal fat (saturated fat) or cholesterol. Being overweight. Not getting enough exercise. A family history of high cholesterol (familial hypercholesterolemia). Use of  tobacco products. Having diabetes. What are the signs or symptoms? In most cases, high cholesterol does not usually cause any symptoms. In severe cases, very high cholesterol levels can cause: Fatty bumps under the skin (xanthomas). A white or gray ring around the black center (pupil) of the eye. How is this diagnosed? This condition may be diagnosed based on the results of a blood test. If you are older than 49 years of age, your health care provider may check your cholesterol levels every 4-6 years. You may be checked more often if you have high cholesterol or other risk factors for heart disease. The blood test for cholesterol measures: "Bad" cholesterol, or LDL cholesterol. This is the main type of cholesterol that causes heart disease. The desired level is less than 100 mg/dL (2.59 mmol/L). "Good" cholesterol, or HDL cholesterol. HDL helps protect against heart disease by cleaning the arteries and carrying the LDL to the liver for processing. The desired level for HDL is 60 mg/dL (1.55 mmol/L) or higher. Triglycerides. These are fats that your body can store or burn for energy. The desired level is less than 150 mg/dL (1.69 mmol/L). Total cholesterol. This measures the total amount of cholesterol in your blood and includes LDL, HDL, and triglycerides. The desired level is less than 200 mg/dL (5.17 mmol/L). How is this treated? Treatment for high cholesterol starts with lifestyle changes, such as diet and exercise. Diet changes. You may be asked to eat foods that have more fiber and less saturated fats or added sugar. Lifestyle changes. These may include regular exercise, maintaining a healthy weight, and quitting use of tobacco products. Medicines. These are given when diet and lifestyle changes have not worked. You may be prescribed a statin medicine to help lower your cholesterol levels. Follow these instructions at home: Eating and drinking  Eat a healthy, balanced diet. This diet  includes: Daily servings of a variety of fresh, frozen, or canned fruits and vegetables. Daily servings of whole grain foods that are rich in fiber. Foods that are low in saturated fats and trans fats. These include poultry and fish without skin, lean cuts of meat, and low-fat dairy products. A variety of fish, especially oily fish that contain omega-3 fatty acids. Aim to eat fish at least 2 times a week. Avoid foods and drinks that have added sugar. Use healthy cooking methods, such as roasting, grilling, broiling, baking, poaching, steaming, and stir-frying. Do not fry your food except for stir-frying. If you drink alcohol: Limit how much you have to: 0-1 drink a day for women who are not pregnant. 0-2 drinks a day for men. Know how much alcohol is in  a drink. In the [place] one drink equals one 12 oz bottle of beer (355 mL), one 5 oz glass of wine (148 mL), or one 1 oz glass of hard liquor (44 mL). Lifestyle  Get regular exercise. Aim to exercise for a total of 150 minutes a week. Increase your activity level by doing activities such as gardening, walking, and taking the stairs. Do not use any products that contain nicotine or tobacco. These products include cigarettes, chewing tobacco, and vaping devices, such as e-cigarettes. If you need help quitting, ask your health care provider. General instructions Take over-the-counter and prescription medicines only as told by your health care provider. Keep all follow-up visits. This is important. Where to find more information American Heart Association: www.heart.org National Heart, Lung, and Blood Institute: https://wilson-eaton.com/ Contact a health care provider if: You have trouble achieving or maintaining a healthy diet or weight. You are starting an exercise program. You are unable to stop smoking. Get help right away if: You have chest pain. You have trouble breathing. You have discomfort or pain in your jaw, neck, back, shoulder, or  arm. You have any symptoms of a stroke. "BE FAST" is an easy way to remember the main warning signs of a stroke: B - Balance. Signs are dizziness, sudden trouble walking, or loss of balance. E - Eyes. Signs are trouble seeing or a sudden change in vision. F - Face. Signs are sudden weakness or numbness of the face, or the face or eyelid drooping on one side. A - Arms. Signs are weakness or numbness in an arm. This happens suddenly and usually on one side of the body. S - Speech. Signs are sudden trouble speaking, slurred speech, or trouble understanding what people say. T - Time. Time to call emergency services. Write down what time symptoms started. You have other signs of a stroke, such as: A sudden, severe headache with no known cause. Nausea or vomiting. Seizure. These symptoms may represent a serious problem that is an emergency. Do not wait to see if the symptoms will go away. Get medical help right away. Call your local emergency services (911 in the [place] Do not drive yourself to the hospital. Summary Cholesterol plaques increase your risk for heart attack and stroke. Work with your health care provider to keep your cholesterol levels in a healthy range. Eat a healthy, balanced diet, get regular exercise, and maintain a healthy weight. Do not use any products that contain nicotine or tobacco. These products include cigarettes, chewing tobacco, and vaping devices, such as e-cigarettes. Get help right away if you have any symptoms of a stroke. This information is not intended to replace advice given to you by your health care provider. Make sure you discuss any questions you have with your health care provider. Document Revised: 01/22/2021 Document Reviewed: 01/12/2021 Elsevier Patient Education  [place].  Cholesterol Content in Foods Cholesterol is a waxy, fat-like substance that helps to carry fat in the blood. The body needs cholesterol in small amounts, but too much  cholesterol can cause damage to the arteries and heart. What foods have cholesterol?  Cholesterol is found in animal-based foods, such as meat, seafood, and dairy. Generally, low-fat dairy and lean meats have less cholesterol than full-fat dairy and fatty meats. The milligrams of cholesterol per serving (mg per serving) of common cholesterol-containing foods are listed below. Meats and other proteins Egg -- one large whole egg has 186 mg. Veal shank -- 4 oz (113 g) has 141  mg. Lean ground [place] (93% lean) -- 4 oz (113 g) has 118 mg. Fat-trimmed lamb loin -- 4 oz (113 g) has 106 mg. Lean ground beef (90% lean) -- 4 oz (113 g) has 100 mg. Lobster -- 3.5 oz (99 g) has 90 mg. Pork loin chops -- 4 oz (113 g) has 86 mg. Canned salmon -- 3.5 oz (99 g) has 83 mg. Fat-trimmed beef top loin -- 4 oz (113 g) has 78 mg. Frankfurter -- 1 frank (3.5 oz or 99 g) has 77 mg. Crab -- 3.5 oz (99 g) has 71 mg. Roasted chicken without skin, white meat -- 4 oz (113 g) has 66 mg. Light bologna -- 2 oz (57 g) has 45 mg. Deli-cut [place] -- 2 oz (57 g) has 31 mg. Canned tuna -- 3.5 oz (99 g) has 31 mg. Berniece Salines -- 1 oz (28 g) has 29 mg. Oysters and mussels (raw) -- 3.5 oz (99 g) has 25 mg. Mackerel -- 1 oz (28 g) has 22 mg. Trout -- 1 oz (28 g) has 20 mg. Pork sausage -- 1 link (1 oz or 28 g) has 17 mg. Salmon -- 1 oz (28 g) has 16 mg. Tilapia -- 1 oz (28 g) has 14 mg. Dairy Soft-serve ice cream --  cup (4 oz or 86 g) has 103 mg. Whole-milk yogurt -- 1 cup (8 oz or 245 g) has 29 mg. Cheddar cheese -- 1 oz (28 g) has 28 mg. American cheese -- 1 oz (28 g) has 28 mg. Whole milk -- 1 cup (8 oz or 250 mL) has 23 mg. 2% milk -- 1 cup (8 oz or 250 mL) has 18 mg. Cream cheese -- 1 tablespoon (Tbsp) (14.5 g) has 15 mg. Cottage cheese --  cup (4 oz or 113 g) has 14 mg. Low-fat (1%) milk -- 1 cup (8 oz or 250 mL) has 10 mg. Sour cream -- 1 Tbsp (12 g) has 8.5 mg. Low-fat yogurt -- 1 cup (8 oz or 245 g) has 8  mg. Nonfat Greek yogurt -- 1 cup (8 oz or 228 g) has 7 mg. Half-and-half cream -- 1 Tbsp (15 mL) has 5 mg. Fats and oils Cod liver oil -- 1 tablespoon (Tbsp) (13.6 g) has 82 mg. Butter -- 1 Tbsp (14 g) has 15 mg. Lard -- 1 Tbsp (12.8 g) has 14 mg. Bacon grease -- 1 Tbsp (12.9 g) has 14 mg. Mayonnaise -- 1 Tbsp (13.8 g) has 5-10 mg. Margarine -- 1 Tbsp (14 g) has 3-10 mg. The items listed above may not be a complete list of foods with cholesterol. Exact amounts of cholesterol in these foods may vary depending on specific ingredients and brands. Contact a dietitian for more information. What foods do not have cholesterol? Most plant-based foods do not have cholesterol unless you combine them with a food that has cholesterol. Foods without cholesterol include: Grains and cereals. Vegetables. Fruits. Vegetable oils, such as olive, canola, and sunflower oil. Legumes, such as peas, beans, and lentils. Nuts and seeds. Egg whites. The items listed above may not be a complete list of foods that do not have cholesterol. Contact a dietitian for more information. Summary The body needs cholesterol in small amounts, but too much cholesterol can cause damage to the arteries and heart. Cholesterol is found in animal-based foods, such as meat, seafood, and dairy. Generally, low-fat dairy and lean meats have less cholesterol than full-fat dairy and fatty meats. This information is not intended to  replace advice given to you by your health care provider. Make sure you discuss any questions you have with your health care provider. Document Revised: 03/20/2021 Document Reviewed: 03/20/2021 Elsevier Patient Education  [place].

## 2022-04-29 NOTE — Progress Notes (Addendum)
Chief Complaint  Patient presents with   Blood Sugar Problem    Pt states she had blood work done 04/27/22 & BS was 410 & A1c was over 10. Pt c/o increased thirst, blurry vision yesterday and urinary frequency. But denies any burning or pain while urinating.    Dm 2  1. A1c 04/28/22 10.4 and HLD trigs 655, hdl 16, ldl 74, total 194 glucose 410 alk phos 136 TSH 4.250, wbc 3.7 h/h 15.3/43.6 plts 186  She has not been taking metformin and eating bad choices  She was having increased thirst, blurry vision and freq urination    Review of Systems  Constitutional:  Negative for weight loss.  HENT:  Negative for hearing loss.   Eyes:  Negative for blurred vision.  Respiratory:  Negative for shortness of breath.   Cardiovascular:  Negative for chest pain.  Gastrointestinal:  Negative for abdominal pain and blood in stool.  Genitourinary:  Negative for dysuria.  Musculoskeletal:  Negative for falls and joint pain.  Skin:  Negative for rash.  Neurological:  Negative for headaches.  Psychiatric/Behavioral:  Negative for depression.    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 Livingston Diones    CESAREAN SECTION  2003   [facility]   DG TOES*L* Bilateral    2 bones removed in small toes 2002    [place] N/A 12/24/2016   Procedure: [place];  Surgeon: Malachy Mood, MD;  Location: [facility];  Service: Gynecology;  Laterality: N/A;   DILATION AND CURETTAGE OF  UTERUS     HYSTEROSCOPY WITH NOVASURE N/A 03/25/2017   Procedure: HYSTEROSCOPY WITH NOVASURE;  Surgeon: Malachy Mood, MD;  Location: [facility];  Service: Gynecology;  Laterality: N/A;   TUBAL LIGATION  2003   [facility]   Family History  Problem Relation Age of Onset   Lung cancer Mother    COPD Mother    Heart attack Mother    Hearing loss Father    Heart disease Father    Hypertension Father    Heart disease Sister    Hypertension Sister    Depression Daughter    Diabetes Maternal Aunt    Other Maternal Aunt    Kidney failure Maternal Aunt    High blood pressure Brother    Diabetes Brother    Kidney disease Brother    Arthritis Brother    Birth defects Brother    Breast cancer Neg Hx    Social History   Socioeconomic History   Marital status: Single    Spouse name: Not on file   Number of children: 3   Years of education: Not on file   Highest education level: Not on file  Occupational History    Comment: [employer]  Tobacco Use   Smoking status: Every Day    Packs/day: 1.00    Years: 30.00  Total pack years: 30.00    Types: Cigarettes   Smokeless tobacco: Never  Vaping Use   Vaping Use: Never used  Substance and Sexual Activity   Alcohol use: Yes    Comment: social - once every 2-3 months    Drug use: No   Sexual activity: Yes    Birth control/protection: Surgical  Other Topics Concern   Not on file  Social History Narrative   Patient is single and lives at home with her daughter. Patient works at [employer]. Patient has three children.   Right handed.    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   [EXPIRED] fluconazole (DIFLUCAN) 150 MG tablet Take 1 tablet (150 mg total) by mouth once for 1 dose. Repeat another dose after 3 days   Semaglutide,0.25 or 0.5MG/DOS, (OZEMPIC, 0.25 OR 0.5 MG/DOSE,) 2 MG/3ML SOPN Inject 0.5 mg into the skin once a week. Month 2   Semaglutide,0.25 or 0.5MG/DOS, 2 MG/3ML SOPN Inject 0.25 mg into the skin once a week. X 1 month, 0.5 weekly x 1 month, 1 mg weekly x 1 month, 2 mg weekly x 1 month if tolerating   No Known Allergies Recent Results (from the past 2160 hour(s))  Urinalysis, Routine w reflex microscopic     Status: Abnormal   Collection Time: 04/29/22  3:48 PM  Result Value Ref Range   Color, Urine YELLOW YELLOW   APPearance CLEAR CLEAR   Specific Gravity, Urine 1.039 (H) 1.001 - 1.035   pH 6.0 5.0 - 8.0   Glucose, UA 3+ (A) NEGATIVE   Bilirubin Urine NEGATIVE NEGATIVE   Ketones, ur NEGATIVE NEGATIVE   Hgb urine dipstick NEGATIVE NEGATIVE   Protein, ur NEGATIVE NEGATIVE   Nitrite POSITIVE (A) NEGATIVE   Leukocytes,Ua NEGATIVE NEGATIVE   WBC, UA 0-5 0 - 5 /HPF   RBC / HPF NONE SEEN 0 - 2 /HPF   Squamous Epithelial / LPF 0-5 < OR = 5 /HPF   Bacteria, UA FEW (A) NONE SEEN /HPF   Hyaline Cast NONE SEEN NONE SEEN /LPF  Microalbumin / creatinine urine ratio     Status: None   Collection Time: 04/29/22  3:48 PM  Result Value Ref Range   Creatinine, Urine 34 20 - 275 mg/dL   Microalb, Ur <0.2 mg/dL    Comment: Reference Range Not established    Microalb Creat Ratio NOTE <30 mcg/mg creat    Comment: NOTE: The urine albumin value is less than  0.2 mg/dL therefore we are unable to calculate  excretion and/or creatinine ratio. . The ADA defines abnormalities in albumin excretion as follows: Marland Kitchen Albuminuria Category        Result (mcg/mg creatinine) . Normal to Mildly increased   <30 Moderately increased         30-299  Severely increased            > OR = 300 . The ADA recommends that at  least two of three specimens collected within a 3-6 month period be abnormal before considering a patient to be within a diagnostic category.   MICROSCOPIC MESSAGE     Status: None   Collection Time: 04/29/22  3:48 PM  Result Value Ref Range   Note      Comment: This urine was analyzed for the presence of WBC,  RBC, bacteria, casts, and other formed elements.  Only those elements seen were reported. . .    Objective  Body mass index is 40.74 kg/m. Wt Readings from Last 3 Encounters:  04/29/22 230 lb (104.3 kg)  09/03/20 235 lb 3.2 oz (106.7 kg)  08/29/20 234 lb 3.2 oz (106.2 kg)   Temp Readings from Last 3 Encounters:  04/29/22 98 F (36.7 C) (Oral)  09/25/21 98.5 F (36.9 C)  09/03/20 97.8 F (36.6 C)   BP Readings from Last 3 Encounters:  04/29/22 110/70  09/25/21 140/70  09/03/20 131/84   Pulse Readings from Last 3 Encounters:  04/29/22 96  09/25/21 78  09/03/20 87    Physical Exam Vitals and nursing note reviewed.  Constitutional:      Appearance: Normal appearance. She is well-developed and well-groomed.  HENT:     Head: Normocephalic and atraumatic.  Eyes:     Conjunctiva/sclera: Conjunctivae normal.     Pupils: Pupils are equal, round, and reactive to light.  Cardiovascular:     Rate and Rhythm: Normal rate and regular rhythm.     Heart sounds: Normal heart sounds. No murmur heard. Pulmonary:     Effort: Pulmonary effort is normal.     Breath sounds: Normal breath sounds.  Abdominal:     General: Abdomen is flat. Bowel sounds are normal.     Tenderness: There is no abdominal tenderness.  Musculoskeletal:        General: No tenderness.  Skin:    General: Skin is warm and dry.  Neurological:     General: No focal deficit present.     Mental Status: She is alert and oriented to person, place, and time. Mental status is at baseline.     Cranial Nerves: Cranial nerves 2-12 are intact.     Motor: Motor  function is intact.     Coordination: Coordination is intact.     Gait: Gait is intact.  Psychiatric:        Attention and Perception: Attention and perception normal.        Mood and Affect: Mood and affect normal.        Speech: Speech normal.        Behavior: Behavior normal. Behavior is cooperative.        Thought Content: Thought content normal.        Cognition and Memory: Cognition and memory normal.        Judgment: Judgment normal.     Assessment  Plan  Type 2 diabetes mellitus with hyperglycemia, without long-term current use of insulin ([place]) - Plan: Semaglutide,0.25 or 0.5MG/DOS, 2 MG/3ML SOPN, Semaglutide,0.25 or 0.5MG/DOS, (OZEMPIC, 0.25 OR 0.5 MG/DOSE,) 2 MG/3ML SOPN, Urinalysis, Routine w reflex microscopic, Microalbumin / creatinine urine ratio Labs [facility] at work 06/22/22 A1c improved 10.4 to 8.2  06/2022 will increase ozempic 0.5 weekly and then titrate to 1 mg week can go up to 2 mg weekly if tolerating  Also increase lipitor 10 to 20 mg daily after dinner   Tc 233 H trigs149, hdl 27, ldl 178  Cmet normal   BMI 40.0-44.9, adult (  [place]) - Plan: Semaglutide,0.25 or 0.5MG/DOS, 2 MG/3ML SOPN, Semaglutide,0.25 or 0.5MG/DOS, (OZEMPIC, 0.25 OR 0.5 MG/DOSE,) 2 MG/3ML SOPN  Hyperlipidemia, unspecified hyperlipidemia type - Plan: Semaglutide,0.25 or 0.5MG/DOS, 2 MG/3ML SOPN, Semaglutide,0.25 or 0.5MG/DOS, (OZEMPIC, 0.25 OR 0.5 MG/DOSE,) 2 MG/3ML SOPN, atorvastatin (LIPITOR) 10 MG tablet  Yeast vaginitis - Plan: fluconazole (DIFLUCAN) 150 MG tablet   HM Fasting labs had 08/2020 [facility] Flu shot 08/13/20   Tdap given today Consider pna 23 vx smoker  covid shot 2/2 utd consider booster after 03/04/20 in 6-8 months   mammo had 12/16/21 negative   -will need repeat mammogram in future  Ordered [place] did not get at work this year 2021 As of 08/29/20 pt trying to schedule    Pap Dr Glennon Mac 05/10/18 neg pap neg HPV + trichomonas referred [facility]    Colonoscopy  referred    Routine cervical smear - Plan: Ambulatory referral to Obstetrics / Gynecology   rec healthy diet and exercise and smoking cessatoin  Provider: Dr. Olivia Mackie McLean-Scocuzza-Internal Medicine

## 2022-04-30 ENCOUNTER — Other Ambulatory Visit: Payer: Self-pay | Admitting: Internal Medicine

## 2022-04-30 DIAGNOSIS — N3000 Acute cystitis without hematuria: Secondary | ICD-10-CM

## 2022-04-30 LAB — URINALYSIS, ROUTINE W REFLEX MICROSCOPIC
Bilirubin Urine: NEGATIVE
Hgb urine dipstick: NEGATIVE
Hyaline Cast: NONE SEEN /LPF
Ketones, ur: NEGATIVE
Leukocytes,Ua: NEGATIVE
Nitrite: POSITIVE — AB
Protein, ur: NEGATIVE
RBC / HPF: NONE SEEN /HPF (ref 0–2)
Specific Gravity, Urine: 1.039 — ABNORMAL HIGH (ref 1.001–1.035)
pH: 6 (ref 5.0–8.0)

## 2022-04-30 LAB — MICROALBUMIN / CREATININE URINE RATIO
Creatinine, Urine: 34 mg/dL (ref 20–275)
Microalb, Ur: <0.2 mg/dL

## 2022-04-30 LAB — URINE CULTURE
MICRO NUMBER:: 13500922
SPECIMEN QUALITY:: ADEQUATE

## 2022-04-30 LAB — MICROSCOPIC MESSAGE

## 2022-05-02 ENCOUNTER — Encounter: Payer: Self-pay | Admitting: Internal Medicine

## 2022-05-03 ENCOUNTER — Telehealth: Payer: Self-pay

## 2022-05-03 DIAGNOSIS — E119 Type 2 diabetes mellitus without complications: Secondary | ICD-10-CM | POA: Diagnosis not present

## 2022-05-03 NOTE — Telephone Encounter (Signed)
PA has been started via [email or web address] for pt's OZEMPIC 0.25-0.5 MG/DOSE PEN on 05/03/22 with clinical notes faxed over to # provided.   Prior Auth (EOC) ID: [id number]  Drug/Service Name:OZEMPIC 0.25-0.5 MG/DOSE PEN Patient:Melinda Ross  Date Requested:05/03/2022 11:21:09 AM   MemberID:[id number]  DOB:1971-07-31  Awaiting denial or approval

## 2022-05-18 DIAGNOSIS — E119 Type 2 diabetes mellitus without complications: Secondary | ICD-10-CM | POA: Diagnosis not present

## 2022-06-21 DIAGNOSIS — Z79899 Other long term (current) drug therapy: Secondary | ICD-10-CM | POA: Diagnosis not present

## 2022-06-21 LAB — HM DIABETES EYE EXAM

## 2022-06-22 DIAGNOSIS — Z0189 Encounter for other specified special examinations: Secondary | ICD-10-CM | POA: Diagnosis not present

## 2022-06-22 LAB — HEMOGLOBIN A1C: Hemoglobin A1C: 8.2

## 2022-06-23 DIAGNOSIS — E119 Type 2 diabetes mellitus without complications: Secondary | ICD-10-CM | POA: Diagnosis not present

## 2022-06-26 ENCOUNTER — Other Ambulatory Visit: Payer: Self-pay | Admitting: Internal Medicine

## 2022-06-26 DIAGNOSIS — E1165 Type 2 diabetes mellitus with hyperglycemia: Secondary | ICD-10-CM

## 2022-06-26 DIAGNOSIS — Z6841 Body Mass Index (BMI) 40.0 and over, adult: Secondary | ICD-10-CM

## 2022-06-26 DIAGNOSIS — E785 Hyperlipidemia, unspecified: Secondary | ICD-10-CM

## 2022-06-28 ENCOUNTER — Encounter: Payer: Self-pay | Admitting: Internal Medicine

## 2022-06-28 ENCOUNTER — Telehealth: Payer: Self-pay | Admitting: Internal Medicine

## 2022-06-28 MED ORDER — ATORVASTATIN CALCIUM 20 MG PO TABS: 20.0000 mg | ORAL_TABLET | Freq: Every day | ORAL | 3 refills | Status: DC

## 2022-06-28 MED ORDER — OZEMPIC (0.25 OR 0.5 MG/DOSE) 2 MG/3ML ~~LOC~~ SOPN: 0.5000 mg | PEN_INJECTOR | SUBCUTANEOUS | 2 refills | Status: DC

## 2022-06-28 MED ORDER — OZEMPIC (1 MG/DOSE) 4 MG/3ML ~~LOC~~ SOPN: 1.0000 mg | PEN_INJECTOR | SUBCUTANEOUS | 2 refills | Status: DC

## 2022-06-28 NOTE — Addendum Note (Signed)
Addended by: Orland Mustard on: 06/28/2022 01:14 AM   Modules accepted: Orders

## 2022-06-28 NOTE — Telephone Encounter (Signed)
06/2022  Inform pt  will increase ozempic 0.5 weekly and then titrate to 1 mg week can go up to 2 mg weekly if tolerating  A1c improved 10.4 to 8.2 goal is <7.0  Continue to get it down medication and healthy diet and exercise   Also increase lipitor 10 to 20 mg daily after dinner

## 2022-06-29 ENCOUNTER — Telehealth: Payer: Self-pay

## 2022-06-29 NOTE — Telephone Encounter (Signed)
LMOM for pt to CB in regards to dosage changes to meds per Dr. Audrie Gallus advice.

## 2022-06-29 NOTE — Telephone Encounter (Signed)
LMOM for pt to CB.  

## 2022-07-06 DIAGNOSIS — E119 Type 2 diabetes mellitus without complications: Secondary | ICD-10-CM | POA: Diagnosis not present

## 2022-07-06 DIAGNOSIS — E785 Hyperlipidemia, unspecified: Secondary | ICD-10-CM | POA: Diagnosis not present

## 2022-07-07 LAB — HM DIABETES EYE EXAM

## 2022-07-11 ENCOUNTER — Encounter: Payer: Self-pay | Admitting: Internal Medicine

## 2022-07-13 ENCOUNTER — Encounter: Payer: Self-pay | Admitting: Internal Medicine

## 2022-07-19 DIAGNOSIS — L930 Discoid lupus erythematosus: Secondary | ICD-10-CM | POA: Diagnosis not present

## 2022-07-19 DIAGNOSIS — L810 Postinflammatory hyperpigmentation: Secondary | ICD-10-CM | POA: Diagnosis not present

## 2022-07-30 ENCOUNTER — Ambulatory Visit (INDEPENDENT_AMBULATORY_CARE_PROVIDER_SITE_OTHER): Payer: BC Managed Care – PPO | Admitting: Internal Medicine

## 2022-07-30 ENCOUNTER — Encounter: Payer: Self-pay | Admitting: Internal Medicine

## 2022-07-30 VITALS — BP 108/62 | HR 90 | Temp 98.3°F | Ht 63.0 in | Wt 213.0 lb

## 2022-07-30 DIAGNOSIS — E1165 Type 2 diabetes mellitus with hyperglycemia: Secondary | ICD-10-CM

## 2022-07-30 DIAGNOSIS — F5102 Adjustment insomnia: Secondary | ICD-10-CM | POA: Diagnosis not present

## 2022-07-30 DIAGNOSIS — Z0000 Encounter for general adult medical examination without abnormal findings: Secondary | ICD-10-CM

## 2022-07-30 DIAGNOSIS — F4329 Adjustment disorder with other symptoms: Secondary | ICD-10-CM

## 2022-07-30 DIAGNOSIS — Z1231 Encounter for screening mammogram for malignant neoplasm of breast: Secondary | ICD-10-CM

## 2022-07-30 MED ORDER — SEMAGLUTIDE (2 MG/DOSE) 8 MG/3ML ~~LOC~~ SOPN: 2.0000 mg | PEN_INJECTOR | SUBCUTANEOUS | 5 refills | Status: DC

## 2022-07-30 MED ORDER — MELATONIN 10 MG PO TABS: 5.0000 mg | ORAL_TABLET | Freq: Every day | ORAL | 11 refills | Status: DC

## 2022-07-30 MED ORDER — OZEMPIC (1 MG/DOSE) 4 MG/3ML ~~LOC~~ SOPN: 1.0000 mg | PEN_INJECTOR | SUBCUTANEOUS | 2 refills | Status: DC

## 2022-07-30 NOTE — Progress Notes (Addendum)
Chief Complaint  Patient presents with   Follow-up    3 month f/u   Annual Exam   Annual and 3 month f/u  1. Dm 2 A1c improved 04/27/22 work labs 10.4 to 8.2 she is currently on ozempic 0.5 weekly and changed her diet  We will increase dose  2. 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  HENT:  Negative for hearing loss.   Eyes:  Negative for blurred vision.  Respiratory:  Negative for shortness of breath.   Cardiovascular:  Negative for chest pain.  Gastrointestinal:  Negative for abdominal pain and blood in stool.  Genitourinary:  Negative for dysuria.  Musculoskeletal:  Negative for falls and joint pain.  Skin:  Negative for rash.  Neurological:  Negative for headaches.  Psychiatric/Behavioral:  Negative for depression.    Past Medical History:  Diagnosis Date   Anemia    Anxiety    Attention deficit disorder (ADD)    Collagen vascular disease (HCC)    lupus   Diabetes mellitus without complication ([place])    DVT of leg (deep venous thrombosis) ([place])    Genital herpes 2015   Hyperlipidemia    Lupus ([place])    SEES DR Precious Reel  Discoid   Mild depression    Numbness    Pulmonary emboli (HCC)    UTI (urinary tract infection)    Past Surgical History:  Procedure Laterality Date   ABLATION     2017 Dr. Livingston Diones    CESAREAN SECTION  2003   [facility]   DG TOES*L* Bilateral    2 bones removed in small toes 2002    [place] N/A 12/24/2016   Procedure: [place];  Surgeon: Malachy Mood, MD;  Location: [facility];  Service: Gynecology;  Laterality: N/A;   DILATION AND CURETTAGE OF UTERUS     HYSTEROSCOPY WITH NOVASURE N/A 03/25/2017   Procedure: HYSTEROSCOPY WITH NOVASURE;  Surgeon: Malachy Mood, MD;  Location: [facility];  Service: Gynecology;  Laterality: N/A;   TUBAL LIGATION  2003   [facility]   Family History  Problem Relation Age of Onset   Lung cancer Mother    COPD Mother    Heart attack Mother    Hearing loss Father    Heart disease Father    Hypertension Father    Heart disease Sister    Hypertension Sister    Depression Daughter    Diabetes Maternal Aunt    Other Maternal Aunt    Kidney failure Maternal Aunt    High blood pressure Brother    Diabetes Brother    Kidney disease Brother    Arthritis Brother    Birth defects Brother    Breast cancer Neg Hx    Social History   Socioeconomic History   Marital status: Single    Spouse name: Not on  file   Number of children: 3   Years of education: Not on file   Highest education level: Not on file  Occupational History    Comment: [employer]  Tobacco Use   Smoking status: Every Day    Packs/day: 1.00    Years: 30.00    Total pack years: 30.00    Types: Cigarettes   Smokeless tobacco: Never  Vaping Use   Vaping Use: Never used  Substance and Sexual Activity   Alcohol use: Yes    Comment: social - once every 2-3 months    Drug use: No   Sexual activity: Yes    Birth control/protection: Surgical  Other Topics Concern   Not on file  Social History Narrative   Patient is single and lives at home  works at [employer]. Patient has three children 1 dog.   Right handed.    Caffeine -5   3 kids ages 44, 50 and 49 as of 08/06/20    Some college    Art gallery manager at [employer] Strain:  Not on file  Food Insecurity: Not on file  Transportation Needs: Not on file  Physical Activity: Not on file  Stress: Not on file  Social Connections: Not on file  Intimate Partner Violence: Not on file   Current Meds  Medication Sig   atorvastatin (LIPITOR) 20 MG tablet Take 1 tablet (20 mg total) by mouth daily. Dc 10 mg   Melatonin 10 MG TABS Take 5-10 mg by mouth at bedtime. 1 hour before bed   [START ON 08/27/2022] Semaglutide, 2 MG/DOSE, 8 MG/3ML SOPN Inject 2 mg as directed once a week.   [DISCONTINUED] OZEMPIC, 0.25 OR 0.5 MG/DOSE, 2 MG/3ML SOPN INJECT 0.'5MG'$  SUBCUTANEOUSLY ONCE A WEEK   [DISCONTINUED] Semaglutide, 1 MG/DOSE, (OZEMPIC, 1 MG/DOSE,) 4 MG/3ML SOPN Inject 1 mg into the skin once a week.   [DISCONTINUED] Semaglutide,0.25 or 0.'5MG'$ /DOS, (OZEMPIC, 0.25 OR 0.5 MG/DOSE,) 2 MG/3ML SOPN Inject 0.5 mg into the skin once a week.   No Known Allergies Recent Results (from the past 2160 hour(s))  HM DIABETES EYE EXAM     Status: None   Collection Time: 06/21/22 12:00 AM  Result Value Ref Range   HM Diabetic Eye Exam No Retinopathy No Retinopathy  Hemoglobin A1c     Status: None   Collection Time: 06/22/22 12:00 AM  Result Value Ref Range   Hemoglobin A1C 8.2     Comment: [facility] at work  HM DIABETES EYE EXAM     Status: None   Collection Time: 07/07/22 12:00 AM  Result Value Ref Range   HM Diabetic Eye Exam No Retinopathy No Retinopathy    Comment: AE Dr. Wallace Going 07/07/22    Objective  Body mass index is 37.73 kg/m. Wt Readings from Last 3 Encounters:  07/30/22 213 lb (96.6 kg)  04/29/22 230 lb (104.3 kg)  09/03/20 235 lb 3.2 oz (106.7 kg)   Temp Readings from Last 3 Encounters:  07/30/22 98.3 F (36.8 C) (Oral)  04/29/22 98 F (36.7 C) (Oral)  09/25/21 98.5 F (36.9 C)   BP Readings from Last 3 Encounters:  07/30/22 108/62  04/29/22 110/70  09/25/21 140/70   Pulse Readings from Last 3 Encounters:  07/30/22 90  04/29/22 96  09/25/21 78    Physical  Exam Vitals and nursing note reviewed.  Constitutional:      Appearance: Normal appearance. She is well-developed and  well-groomed.  HENT:     Head: Normocephalic and atraumatic.  Eyes:     Conjunctiva/sclera: Conjunctivae normal.     Pupils: Pupils are equal, round, and reactive to light.  Cardiovascular:     Rate and Rhythm: Normal rate and regular rhythm.     Heart sounds: Normal heart sounds. No murmur heard. Pulmonary:     Effort: Pulmonary effort is normal.     Breath sounds: Normal breath sounds.  Abdominal:     General: Abdomen is flat. Bowel sounds are normal.     Tenderness: There is no abdominal tenderness.  Musculoskeletal:        General: No tenderness.  Skin:    General: Skin is warm and dry.  Neurological:     General: No focal deficit present.     Mental Status: She is alert and oriented to person, place, and time. Mental status is at baseline.     Cranial Nerves: Cranial nerves 2-12 are intact.     Motor: Motor function is intact.     Coordination: Coordination is intact.     Gait: Gait is intact.  Psychiatric:        Attention and Perception: Attention and perception normal.        Mood and Affect: Mood and affect normal.        Speech: Speech normal.        Behavior: Behavior normal. Behavior is cooperative.        Thought Content: Thought content normal.        Cognition and Memory: Cognition and memory normal.        Judgment: Judgment normal.     Assessment  Plan  Annual physical exam See below   Type 2 diabetes mellitus with hyperglycemia, without long-term current use of insulin (HCC)  A1c improved 04/27/22 10.4 to 8.2 on ozempic 0.5 will titrate dose tolerating- Plan: Semaglutide, 1 MG/DOSE, (OZEMPIC, 1 MG/DOSE,) 4 MG/3ML SOPN weekly x 1 month then increase to, Semaglutide, 2 MG/DOSE, 8 MG/3ML SOPN weekly  Rec healthy diet and exercise  Pt gets labs with nurse a work [facility] labs and will need to keep these up Q3-6 months She is not sure who she  will establish with for PCP after I leave  09/03/22 but will get back with me and let me know  08/31/22 labs at work a1c 5.8 from 8.2 from >10  Adjustment insomnia - Plan: Melatonin 10 MG TABS qhs 1 hour before bed if this does not help consider lunesta short term Stress and adjustment reaction  Rec reach out to therapy at work vs call and make appt psych and therapy with [facility] in [place] or [place] locations   HM labs [facility] at work 04/27/22 and 06/22/22  Flu shot declines today Tdap given 04/29/22 Consider pna 20 vx in [place] she is smoker  covid shot 2/2 utd consider boosters   mammo had 12/16/21 negative ordered 2024 GIB in [place]    Pap Dr Glennon Mac 05/10/18 neg pap neg HPV + trichomonas referred [facility] 04/29/22 pt has not scheduled yet given #   Colonoscopy referred 04/29/22 pt has not sch yet given #     rec healthy diet and exercise and smoking cessation, rec call therapy with work   Provider: Dr. Olivia Mackie McLean-Scocuzza-Internal Medicine

## 2022-07-30 NOTE — Patient Instructions (Addendum)
Melinda Haws, MD Family Medicine Primary Care      Try melatonin 5-10 mg at night if you need something stronger let me know   Call if needed therapy and psychiatry open days a week [facility] as given the info before for both  [facility] counseling and psychiatry [facility]  [place] [phone]    [facility] [place]  [address] [place] [address]  [phone]  Ob/gyn call for appt  Phone Fax E-mail Address  [phone] Not available [place] [address]     Specialties     Obstetrics and Gynecology              [facility] GI   Phone Fax E-mail Address  [phone] Not available [place] [address]     Specialties     Gastroenterology

## 2022-08-02 ENCOUNTER — Encounter: Payer: Self-pay | Admitting: Internal Medicine

## 2022-08-02 DIAGNOSIS — F4329 Adjustment disorder with other symptoms: Secondary | ICD-10-CM | POA: Insufficient documentation

## 2022-08-18 DIAGNOSIS — Z23 Encounter for immunization: Secondary | ICD-10-CM | POA: Diagnosis not present

## 2022-08-23 DIAGNOSIS — J069 Acute upper respiratory infection, unspecified: Secondary | ICD-10-CM | POA: Diagnosis not present

## 2022-08-23 DIAGNOSIS — J209 Acute bronchitis, unspecified: Secondary | ICD-10-CM | POA: Diagnosis not present

## 2022-09-02 ENCOUNTER — Telehealth: Payer: Self-pay | Admitting: Internal Medicine

## 2022-09-02 DIAGNOSIS — E119 Type 2 diabetes mellitus without complications: Secondary | ICD-10-CM

## 2022-09-02 DIAGNOSIS — E559 Vitamin D deficiency, unspecified: Secondary | ICD-10-CM

## 2022-09-02 MED ORDER — CHOLECALCIFEROL 1.25 MG (50000 UT) PO CAPS: 50000.0000 [IU] | ORAL_CAPSULE | ORAL | 1 refills | Status: AC

## 2022-09-02 NOTE — Telephone Encounter (Signed)
Labs vitamin D low sent weekly to pharmacy vitamin D 17.3 08/31/22   A1c improved to 5.8 08/31/22 great work continue medications same doses

## 2022-09-03 NOTE — Telephone Encounter (Signed)
Pt has been informed.

## 2022-12-03 DIAGNOSIS — J189 Pneumonia, unspecified organism: Secondary | ICD-10-CM | POA: Diagnosis not present

## 2022-12-03 DIAGNOSIS — R059 Cough, unspecified: Secondary | ICD-10-CM | POA: Diagnosis not present

## 2022-12-22 ENCOUNTER — Ambulatory Visit
Admission: RE | Admit: 2022-12-22 | Discharge: 2022-12-22 | Disposition: A | Discharge status: Home / Self Care | Payer: BC Managed Care – PPO | Source: Ambulatory Visit | Attending: Internal Medicine | Admitting: Internal Medicine

## 2022-12-22 DIAGNOSIS — Z1231 Encounter for screening mammogram for malignant neoplasm of breast: Secondary | ICD-10-CM

## 2023-06-04 IMAGING — MG MM DIGITAL SCREENING BILAT W/ TOMO AND CAD
6 of 10 series · 6 of 30 positions shown · non-contrast
Comparison: Previous exam(s).

CLINICAL DATA: Screening.

EXAM:
DIGITAL SCREENING BILATERAL MAMMOGRAM WITH TOMOSYNTHESIS AND CAD
TECHNIQUE: Bilateral screening digital craniocaudal and mediolateral oblique
mammograms were obtained. Bilateral screening digital breast
tomosynthesis was performed. The images were evaluated with
computer-aided detection.

[L CC synth-2D]
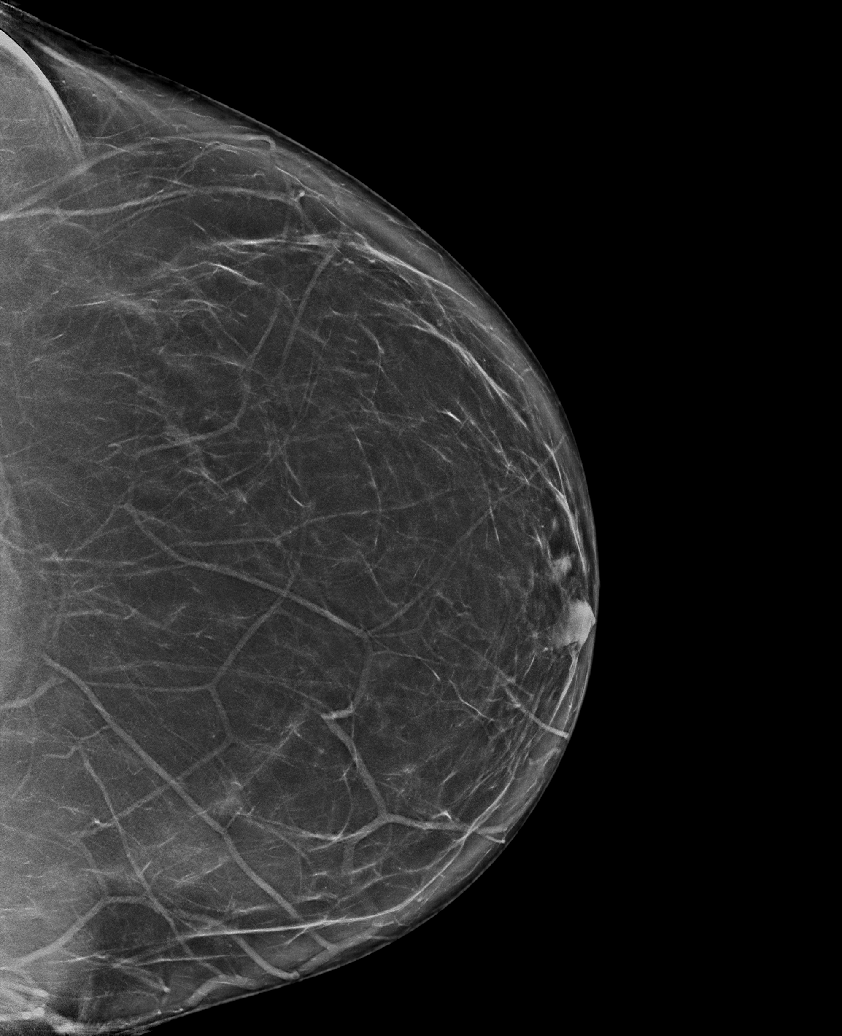

[R CC synth-2D]
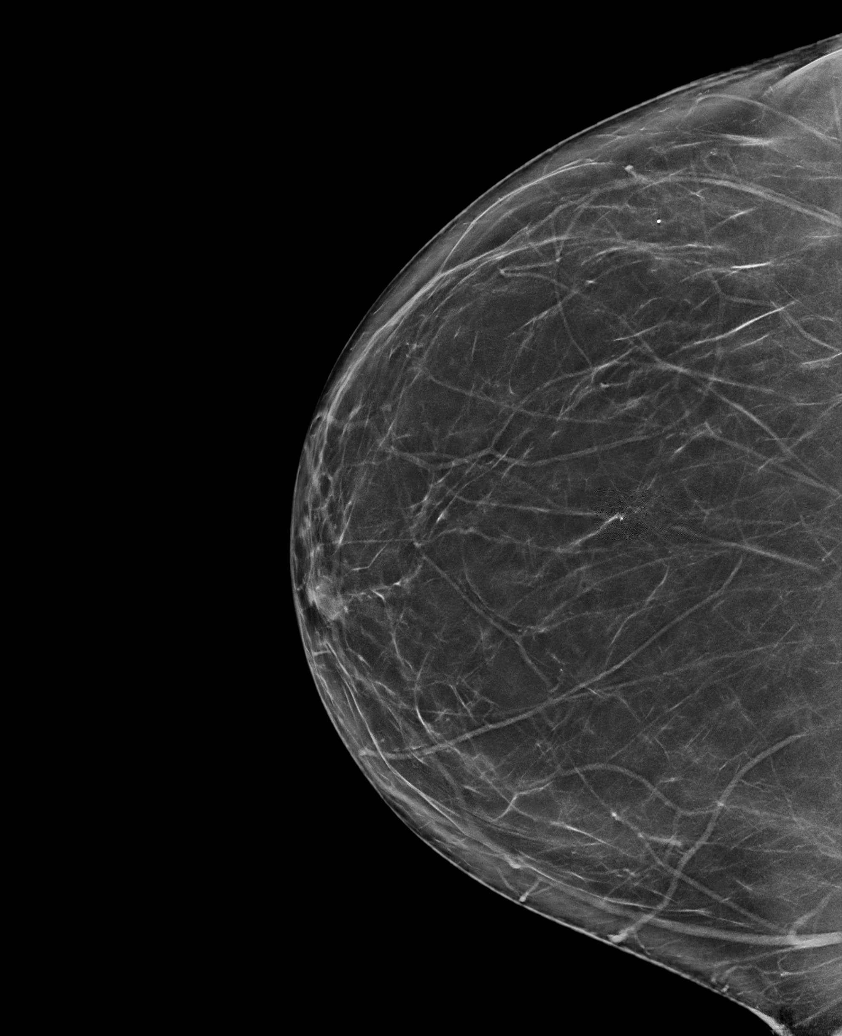

[L MLO synth-2D]
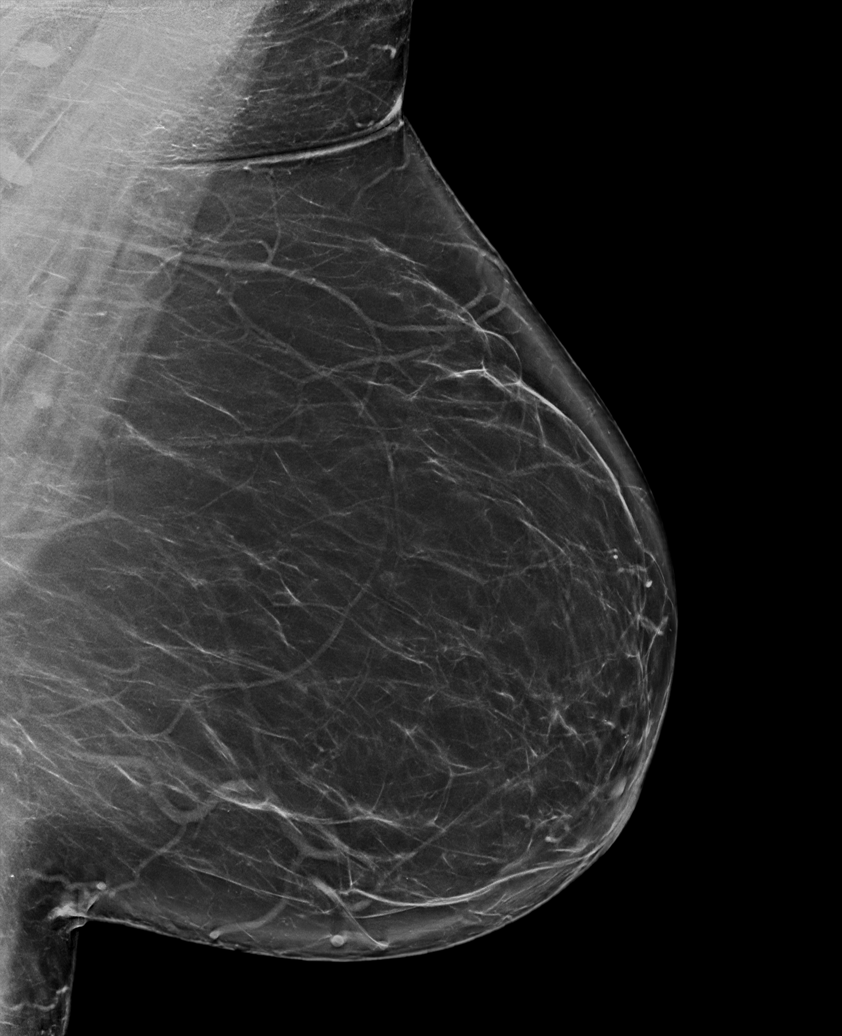

[L CV synth-2D]
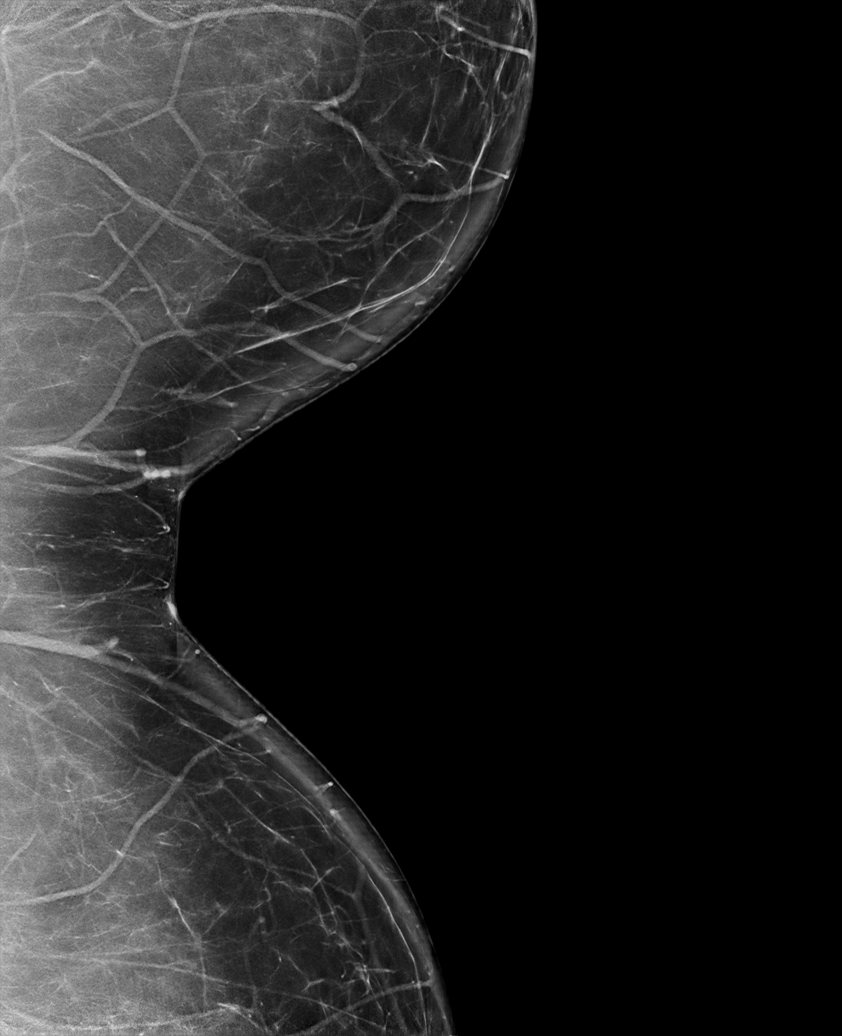

[R MLO synth-2D]
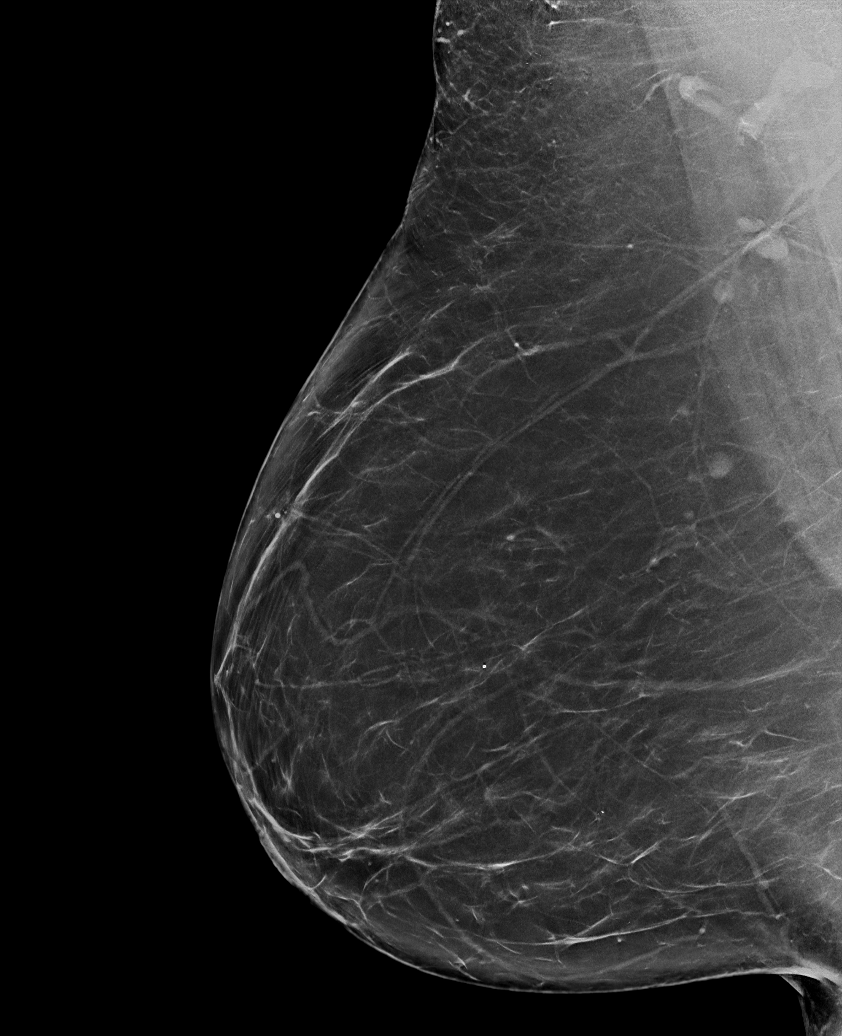

[L CV tomo · tomo slice 45/90.0]
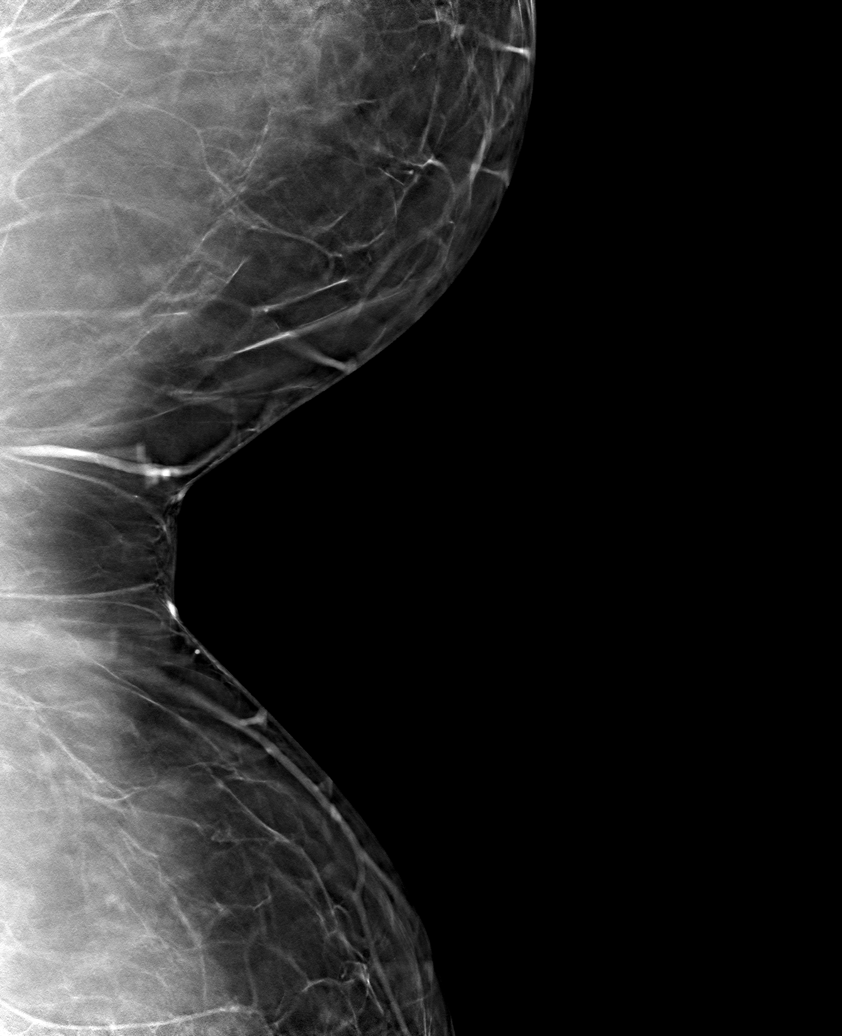

[6 of 30 positions shown; findings below may reference images not displayed]

ACR Breast Density Category b: There are scattered areas of
fibroglandular density.
FINDINGS: There are no findings suspicious for malignancy.
IMPRESSION: No mammographic evidence of malignancy. A result letter of this
screening mammogram will be mailed directly to the patient.

RECOMMENDATION:
Screening mammogram in one year. (Code:51-O-LD2)

BI-RADS CATEGORY  1: Negative.

## 2023-07-07 ENCOUNTER — Encounter (INDEPENDENT_AMBULATORY_CARE_PROVIDER_SITE_OTHER): Payer: Self-pay

## 2023-07-14 ENCOUNTER — Other Ambulatory Visit (HOSPITAL_COMMUNITY): Payer: Self-pay

## 2023-08-18 ENCOUNTER — Encounter: Payer: BC Managed Care – PPO | Admitting: Family Medicine

## 2023-09-14 DIAGNOSIS — B349 Viral infection, unspecified: Secondary | ICD-10-CM | POA: Diagnosis not present

## 2023-11-01 DIAGNOSIS — R7989 Other specified abnormal findings of blood chemistry: Secondary | ICD-10-CM | POA: Diagnosis not present

## 2023-11-01 DIAGNOSIS — Z8639 Personal history of other endocrine, nutritional and metabolic disease: Secondary | ICD-10-CM | POA: Diagnosis not present

## 2023-11-01 DIAGNOSIS — R946 Abnormal results of thyroid function studies: Secondary | ICD-10-CM | POA: Diagnosis not present

## 2023-11-01 DIAGNOSIS — E042 Nontoxic multinodular goiter: Secondary | ICD-10-CM | POA: Diagnosis not present

## 2023-11-01 DIAGNOSIS — E782 Mixed hyperlipidemia: Secondary | ICD-10-CM | POA: Diagnosis not present

## 2023-11-01 DIAGNOSIS — F17210 Nicotine dependence, cigarettes, uncomplicated: Secondary | ICD-10-CM | POA: Diagnosis not present

## 2023-11-01 DIAGNOSIS — E1165 Type 2 diabetes mellitus with hyperglycemia: Secondary | ICD-10-CM | POA: Diagnosis not present

## 2023-11-14 DIAGNOSIS — E042 Nontoxic multinodular goiter: Secondary | ICD-10-CM | POA: Diagnosis not present

## 2023-12-22 ENCOUNTER — Emergency Department
Admission: EM | Admit: 2023-12-22 | Discharge: 2023-12-22 | Disposition: A | Discharge status: Home / Self Care | Payer: BC Managed Care – PPO | Attending: Emergency Medicine | Admitting: Emergency Medicine

## 2023-12-22 ENCOUNTER — Other Ambulatory Visit: Payer: Self-pay

## 2023-12-22 ENCOUNTER — Encounter: Payer: Self-pay | Admitting: Emergency Medicine

## 2023-12-22 DIAGNOSIS — E1165 Type 2 diabetes mellitus with hyperglycemia: Secondary | ICD-10-CM | POA: Insufficient documentation

## 2023-12-22 DIAGNOSIS — R739 Hyperglycemia, unspecified: Secondary | ICD-10-CM | POA: Diagnosis not present

## 2023-12-22 DIAGNOSIS — Z7901 Long term (current) use of anticoagulants: Secondary | ICD-10-CM | POA: Insufficient documentation

## 2023-12-22 LAB — URINALYSIS, ROUTINE W REFLEX MICROSCOPIC
Bacteria, UA: NONE SEEN
Bilirubin Urine: NEGATIVE
Glucose, UA: >=500 mg/dL — AB
Hgb urine dipstick: NEGATIVE
Ketones, ur: NEGATIVE mg/dL
Leukocytes,Ua: NEGATIVE
Nitrite: NEGATIVE
Protein, ur: NEGATIVE mg/dL
Specific Gravity, Urine: 1.03 (ref 1.005–1.030)
Squamous Epithelial / HPF: 0 /[HPF] (ref 0–5)
pH: 5 (ref 5.0–8.0)

## 2023-12-22 LAB — CBC
HCT: 42.4 % (ref 36.0–46.0)
Hemoglobin: 15.3 g/dL — ABNORMAL HIGH (ref 12.0–15.0)
MCH: 33.4 pg (ref 26.0–34.0)
MCHC: 36.1 g/dL — ABNORMAL HIGH (ref 30.0–36.0)
MCV: 92.6 fL (ref 80.0–100.0)
Platelets: 213 10*3/uL (ref 150–400)
RBC: 4.58 MIL/uL (ref 3.87–5.11)
RDW: 11.9 % (ref 11.5–15.5)
WBC: 6.7 10*3/uL (ref 4.0–10.5)
nRBC: 0 % (ref 0.0–0.2)

## 2023-12-22 LAB — CBG MONITORING, ED
Glucose-Capillary: >600 mg/dL (ref 70–99)
Glucose-Capillary: 363 mg/dL — ABNORMAL HIGH (ref 70–99)
Glucose-Capillary: 360 mg/dL — ABNORMAL HIGH (ref 70–99)
Glucose-Capillary: 244 mg/dL — ABNORMAL HIGH (ref 70–99)

## 2023-12-22 LAB — BASIC METABOLIC PANEL
Anion gap: 15 (ref 5–15)
BUN: 25 mg/dL — ABNORMAL HIGH (ref 6–20)
CO2: 20 mmol/L — ABNORMAL LOW (ref 22–32)
Calcium: 9.5 mg/dL (ref 8.9–10.3)
Chloride: 99 mmol/L (ref 98–111)
Creatinine, Ser: 1.24 mg/dL — ABNORMAL HIGH (ref 0.44–1.00)
GFR, Estimated: 53 mL/min — ABNORMAL LOW (ref >60)
Glucose, Bld: 595 mg/dL (ref 70–99)
Potassium: 4.2 mmol/L (ref 3.5–5.1)
Sodium: 134 mmol/L — ABNORMAL LOW (ref 135–145)

## 2023-12-22 MED ORDER — OZEMPIC (1 MG/DOSE) 4 MG/3ML ~~LOC~~ SOPN: 1.0000 mg | PEN_INJECTOR | SUBCUTANEOUS | 2 refills | Status: DC

## 2023-12-22 MED ORDER — SODIUM CHLORIDE 0.9 % IV BOLUS
1000.0000 mL | Freq: Once | INTRAVENOUS | Status: AC
  Administered 2023-12-22: 1000 mL via INTRAVENOUS

## 2023-12-22 MED ORDER — INSULIN ASPART 100 UNIT/ML IJ SOLN
5.0000 [IU] | Freq: Once | INTRAMUSCULAR | Status: AC
  Administered 2023-12-22: 5 [IU] via INTRAVENOUS
  Filled 2023-12-22: qty 1

## 2023-12-22 MED ORDER — METFORMIN HCL 500 MG PO TABS: 1000.0000 mg | ORAL_TABLET | Freq: Two times a day (BID) | ORAL | 0 refills | Status: DC

## 2023-12-22 NOTE — ED Provider Notes (Signed)
[facility] Provider Note    Event Date/Time   First MD Initiated Contact with Patient 12/22/23 1738     (approximate)   History   Chief Complaint Hyperglycemia   HPI  Melinda Ross is a 53 y.o. female with past medical history of hyperlipidemia, diabetes, SLE, and PE on Eliquis who presents to the ED complaining of hyperglycemia.  Patient reports that her PCP retired a few months ago and she has not been able to take prescribed medications for diabetes since then.  She states her blood sugars have been running high, she has been urinating frequently, and she has been feeling dizzy and lightheaded at times.  She states that her vision will occasionally get blurry but she has not had any numbness or weakness in her extremities.  She denies any fevers or dysuria, has not had any nausea, vomiting, diarrhea, abdominal pain, or flank pain.     Physical Exam   Triage Vital Signs: ED Triage Vitals  Encounter Vitals Group     BP 12/22/23 1439 (!) 147/96     Systolic BP Percentile --      Diastolic BP Percentile --      Pulse Rate 12/22/23 1439 93     Resp 12/22/23 1439 18     Temp 12/22/23 1439 98.4 F (36.9 C)     Temp Source 12/22/23 1439 Oral     SpO2 12/22/23 1439 99 %     Weight 12/22/23 1443 210 lb (95.3 kg)     Height 12/22/23 1443 5\' 3"  (1.6 m)     Head Circumference --      Peak Flow --      Pain Score 12/22/23 1443 0     Pain Loc --      Pain Education --      Exclude from Growth Chart --     Most recent vital signs: Vitals:   12/22/23 1439 12/22/23 1851  BP: (!) 147/96 (!) 148/88  Pulse: 93 88  Resp: 18 18  Temp: 98.4 F (36.9 C) 98 F (36.7 C)  SpO2: 99% 99%    Constitutional: Alert and oriented. Eyes: Conjunctivae are normal. Head: Atraumatic. Nose: No congestion/rhinnorhea. Mouth/Throat: Mucous membranes are moist.  Cardiovascular: Normal rate, regular rhythm. Grossly normal heart sounds.  2+ radial pulses  bilaterally. Respiratory: Normal respiratory effort.  No retractions. Lungs CTAB. Gastrointestinal: Soft and nontender. No distention. Musculoskeletal: No lower extremity tenderness nor edema.  Neurologic:  Normal speech and language. No gross focal neurologic deficits are appreciated.    ED Results / Procedures / Treatments   Labs (all labs ordered are listed, but only abnormal results are displayed) Labs Reviewed  URINALYSIS, ROUTINE W REFLEX MICROSCOPIC - Abnormal; Notable for the following components:      Result Value   Color, Urine STRAW (*)    APPearance CLEAR (*)    Glucose, UA >=500 (*)    All other components within normal limits  BASIC METABOLIC PANEL - Abnormal; Notable for the following components:   Sodium 134 (*)    CO2 20 (*)    Glucose, Bld 595 (*)    BUN 25 (*)    Creatinine, Ser 1.24 (*)    GFR, Estimated 53 (*)    All other components within normal limits  CBC - Abnormal; Notable for the following components:   Hemoglobin 15.3 (*)    MCHC 36.1 (*)    All other components within normal limits  CBG MONITORING,  ED - Abnormal; Notable for the following components:   Glucose-Capillary >600 (*)    All other components within normal limits  CBG MONITORING, ED - Abnormal; Notable for the following components:   Glucose-Capillary 363 (*)    All other components within normal limits  CBG MONITORING, ED - Abnormal; Notable for the following components:   Glucose-Capillary 360 (*)    All other components within normal limits  CBG MONITORING, ED - Abnormal; Notable for the following components:   Glucose-Capillary 244 (*)    All other components within normal limits    PROCEDURES:  Critical Care performed: No  Procedures   MEDICATIONS ORDERED IN ED: Medications  sodium chloride 0.9 % bolus 1,000 mL (0 mLs Intravenous Stopped 12/22/23 1808)  sodium chloride 0.9 % bolus 1,000 mL (1,000 mLs Intravenous New Bag/Given 12/22/23 1908)  insulin aspart (novoLOG)  injection 5 Units (5 Units Intravenous Given 12/22/23 2006)     IMPRESSION / MDM / ASSESSMENT AND PLAN / ED COURSE  I reviewed the triage vital signs and the nursing notes.                              53 y.o. female with past medical history of hyperlipidemia, diabetes, SLE, and PE on Eliquis who presents to the ED complaining of hyperglycemia with lightheadedness, blurry vision, and urinary frequency for the past week.  Patient's presentation is most consistent with acute presentation with potential threat to life or bodily function.  Differential diagnosis includes, but is not limited to, hyperglycemia, DKA, HHS, dehydration, electrolyte abnormality, AKI, UTI.  Patient nontoxic-appearing and in no acute distress, vital signs are unremarkable.  Labs show no significant anemia or leukocytosis, she does have significant hyperglycemia with a mild AKI, but no evidence of DKA and no symptoms to suggest HHS.  She was given 1 L of IV fluids in triage, will give an additional liter and recheck CBG.  Urinalysis shows no signs of infection.  Blood glucose now improving and patient appropriate for discharge home with outpatient follow-up.  We will restart her Ozempic and metformin that she was taking previously.  Referral provided to reestablish care with PCP and she was counseled to return to the ED for new or worsening symptoms.  Patient clarifies that she was previously taken off of the blood thinner.  Patient agrees with plan.      FINAL CLINICAL IMPRESSION(S) / ED DIAGNOSES   Final diagnoses:  Hyperglycemia     Rx / DC Orders   ED Discharge Orders          Ordered    Ambulatory Referral to Primary Care (Establish Care)        12/22/23 2129    Semaglutide, 1 MG/DOSE, (OZEMPIC, 1 MG/DOSE,) 4 MG/3ML SOPN  Weekly        12/22/23 2148    metFORMIN (GLUCOPHAGE) 500 MG tablet  2 times daily with meals        12/22/23 2148             Note:  This document was prepared using Dragon  voice recognition software and may include unintentional dictation errors.   Chesley Noon, MD 12/22/23 2149

## 2023-12-22 NOTE — ED Notes (Signed)
Urine sent

## 2023-12-22 NOTE — ED Provider Triage Note (Signed)
Emergency Medicine Provider Triage Evaluation Note  Melinda Ross , a 53 y.o. female  was evaluated in triage.  Pt complains of hyperglycemia.  States that she has not taken her insulin since October.  Increased urination and thirst.  PCP retired.    Review of Systems  Positive: Polyuria,  Negative: No n,v,d.   Physical Exam  BP (!) 147/96 (BP Location: Left Arm)   Pulse 93   Temp 98.4 F (36.9 C) (Oral)   Resp 18   Ht 5\' 3"  (1.6 m)   Wt 95.3 kg   SpO2 99%   BMI 37.20 kg/m  Gen:   Awake, no distress    Alert, talkative Resp:  Normal effort  Lungs clear bilat.  MSK:   Moves extremities without difficulty  Other:    Medical Decision Making  Medically screening exam initiated at 2:46 PM.  Appropriate orders placed.  Melinda Ross was informed that the remainder of the evaluation will be completed by another provider, this initial triage assessment does not replace that evaluation, and the importance of remaining in the ED until their evaluation is complete.     Tommi Rumps, PA-C 12/22/23 [phone]

## 2023-12-22 NOTE — ED Notes (Signed)
Pt documented hyperglycemia this date

## 2023-12-22 NOTE — ED Notes (Signed)
See triage note  Presents with high blood sugar  States she has not been feeling well  and has been out of her insulin since oct

## 2023-12-22 NOTE — ED Triage Notes (Signed)
Arrives from [facility].. no diabetic meds x 3 months.  C?O hyperdypsia, polyuria.

## 2023-12-22 NOTE — ED Triage Notes (Signed)
Pt to ED via POV. Pt states that she thinks her A1C is high. Pt states that she has not been feeling good. Pt states that she has not had her insulin since October. Pt states that she is having increased urination and that she is falling asleep a lot. Pt states that her urine has had a "sweet" smell to it.

## 2023-12-23 DIAGNOSIS — E785 Hyperlipidemia, unspecified: Secondary | ICD-10-CM | POA: Diagnosis not present

## 2023-12-23 DIAGNOSIS — R3589 Other polyuria: Secondary | ICD-10-CM | POA: Diagnosis not present

## 2023-12-23 DIAGNOSIS — H538 Other visual disturbances: Secondary | ICD-10-CM | POA: Diagnosis not present

## 2023-12-23 DIAGNOSIS — F17210 Nicotine dependence, cigarettes, uncomplicated: Secondary | ICD-10-CM | POA: Diagnosis not present

## 2023-12-23 DIAGNOSIS — E1165 Type 2 diabetes mellitus with hyperglycemia: Secondary | ICD-10-CM | POA: Diagnosis not present

## 2023-12-23 DIAGNOSIS — R8279 Other abnormal findings on microbiological examination of urine: Secondary | ICD-10-CM | POA: Diagnosis not present

## 2023-12-23 DIAGNOSIS — E119 Type 2 diabetes mellitus without complications: Secondary | ICD-10-CM | POA: Diagnosis not present

## 2023-12-23 DIAGNOSIS — Z7984 Long term (current) use of oral hypoglycemic drugs: Secondary | ICD-10-CM | POA: Diagnosis not present

## 2023-12-24 DIAGNOSIS — E1165 Type 2 diabetes mellitus with hyperglycemia: Secondary | ICD-10-CM | POA: Diagnosis not present

## 2023-12-27 DIAGNOSIS — E559 Vitamin D deficiency, unspecified: Secondary | ICD-10-CM | POA: Diagnosis not present

## 2023-12-27 DIAGNOSIS — E782 Mixed hyperlipidemia: Secondary | ICD-10-CM | POA: Diagnosis not present

## 2023-12-27 DIAGNOSIS — E1165 Type 2 diabetes mellitus with hyperglycemia: Secondary | ICD-10-CM | POA: Diagnosis not present

## 2023-12-27 DIAGNOSIS — F17200 Nicotine dependence, unspecified, uncomplicated: Secondary | ICD-10-CM | POA: Diagnosis not present

## 2024-02-14 ENCOUNTER — Telehealth: Payer: Self-pay

## 2024-02-14 NOTE — Telephone Encounter (Signed)
 Copied from CRM [id number]. Topic: General - Other >> Feb 14, 2024  9:22 AM Fredrich Romans wrote: Reason for CRM: Patient called in stating that she would like to know if she could have a new support letter for her support dog that she has had for the last 4 years. She received a letter on October 06/2020,however since then she has lost the letter in a move. If letter is able to be prepared for her ,she would lie to pick it up once ready.

## 2024-03-01 ENCOUNTER — Encounter: Payer: Self-pay | Admitting: Nurse Practitioner

## 2024-03-01 ENCOUNTER — Ambulatory Visit: Admitting: Nurse Practitioner

## 2024-03-01 VITALS — BP 123/76 | HR 81 | Temp 97.9°F | Resp 16 | Ht 62.99 in | Wt 214.2 lb

## 2024-03-01 DIAGNOSIS — E782 Mixed hyperlipidemia: Secondary | ICD-10-CM | POA: Diagnosis not present

## 2024-03-01 DIAGNOSIS — E119 Type 2 diabetes mellitus without complications: Secondary | ICD-10-CM | POA: Diagnosis not present

## 2024-03-01 DIAGNOSIS — E559 Vitamin D deficiency, unspecified: Secondary | ICD-10-CM | POA: Diagnosis not present

## 2024-03-01 DIAGNOSIS — Z7689 Persons encountering health services in other specified circumstances: Secondary | ICD-10-CM

## 2024-03-01 DIAGNOSIS — E1165 Type 2 diabetes mellitus with hyperglycemia: Secondary | ICD-10-CM

## 2024-03-01 DIAGNOSIS — Z6841 Body Mass Index (BMI) 40.0 and over, adult: Secondary | ICD-10-CM

## 2024-03-01 DIAGNOSIS — M542 Cervicalgia: Secondary | ICD-10-CM

## 2024-03-01 DIAGNOSIS — Z7985 Long-term (current) use of injectable non-insulin antidiabetic drugs: Secondary | ICD-10-CM

## 2024-03-01 DIAGNOSIS — Z720 Tobacco use: Secondary | ICD-10-CM

## 2024-03-01 DIAGNOSIS — F17200 Nicotine dependence, unspecified, uncomplicated: Secondary | ICD-10-CM | POA: Diagnosis not present

## 2024-03-01 DIAGNOSIS — E042 Nontoxic multinodular goiter: Secondary | ICD-10-CM

## 2024-03-01 DIAGNOSIS — M329 Systemic lupus erythematosus, unspecified: Secondary | ICD-10-CM

## 2024-03-01 DIAGNOSIS — M25562 Pain in left knee: Secondary | ICD-10-CM

## 2024-03-01 DIAGNOSIS — G8929 Other chronic pain: Secondary | ICD-10-CM

## 2024-03-01 DIAGNOSIS — E785 Hyperlipidemia, unspecified: Secondary | ICD-10-CM

## 2024-03-01 MED ORDER — CYCLOBENZAPRINE HCL 5 MG PO TABS: 5.0000 mg | ORAL_TABLET | Freq: Every day | ORAL | 0 refills | Status: DC

## 2024-03-01 MED ORDER — DICLOFENAC SODIUM 1 % EX GEL
2.0000 g | Freq: Four times a day (QID) | CUTANEOUS | 0 refills | Status: DC
Start: 2024-03-01 — End: 2024-06-01

## 2024-03-01 MED ORDER — ATORVASTATIN CALCIUM 20 MG PO TABS: 20.0000 mg | ORAL_TABLET | Freq: Every day | ORAL | Status: DC

## 2024-03-01 NOTE — Assessment & Plan Note (Signed)
 Chronic.  Continue with Ozempic to aid in weight loss efforts.
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
## 2024-03-01 NOTE — Assessment & Plan Note (Signed)
 Will start Voltaren Gel as needed for pain. Will also treat with cyclobenzaprine 5mg  daily.  Side effects and benefits discussed during visit.  Follow up if not improved.

## 2024-03-01 NOTE — Assessment & Plan Note (Signed)
 Encouraged patient to make an appointment Rheumatology to restart Plaquenil.  She agrees and plans to make an appointment today.

## 2024-03-01 NOTE — Assessment & Plan Note (Signed)
 Chronic.  Has not been taking Atorvastatin.  Thought it was supposed to be a weekly pill.  Educated patient on taking medication daily.  She agrees to restart medication daily.  Follow up in 3 months.  Call sooner if concerns arise.

## 2024-03-01 NOTE — Assessment & Plan Note (Signed)
 Followed by Dr. Tedd Sias.  No current medication.

## 2024-03-01 NOTE — Assessment & Plan Note (Addendum)
 Current everyday smoker. Smoking about 0.5pdd.

## 2024-03-02 ENCOUNTER — Encounter: Payer: Self-pay | Admitting: Nurse Practitioner

## 2024-03-02 LAB — COMPREHENSIVE METABOLIC PANEL WITH GFR
ALT: 14 IU/L (ref 0–32)
AST: 19 IU/L (ref 0–40)
Albumin: 4.2 g/dL (ref 3.9–4.9)
Alkaline Phosphatase: 89 IU/L (ref 44–121)
BUN/Creatinine Ratio: 16 (ref 9–23)
BUN: 13 mg/dL (ref 6–24)
Bilirubin Total: 0.2 mg/dL (ref 0.0–1.2)
CO2: 21 mmol/L (ref 20–29)
Calcium: 9.4 mg/dL (ref 8.7–10.2)
Chloride: 105 mmol/L (ref 96–106)
Creatinine, Ser: 0.79 mg/dL (ref 0.57–1.00)
Globulin, Total: 2.4 g/dL (ref 1.5–4.5)
Glucose: 64 mg/dL — ABNORMAL LOW (ref 70–99)
Potassium: 3.8 mmol/L (ref 3.5–5.2)
Sodium: 143 mmol/L (ref 134–144)
Total Protein: 6.6 g/dL (ref 6.0–8.5)
eGFR: 91 mL/min/1.73

## 2024-03-05 ENCOUNTER — Encounter: Payer: Self-pay | Admitting: Nurse Practitioner

## 2024-03-05 ENCOUNTER — Ambulatory Visit
Admission: RE | Admit: 2024-03-05 | Discharge: 2024-03-05 | Disposition: A | Discharge status: Home / Self Care | Source: Ambulatory Visit | Attending: Nurse Practitioner | Admitting: Nurse Practitioner

## 2024-03-05 ENCOUNTER — Ambulatory Visit
Admission: RE | Admit: 2024-03-05 | Discharge: 2024-03-05 | Disposition: A | Discharge status: Home / Self Care | Attending: Nurse Practitioner | Admitting: Nurse Practitioner

## 2024-03-05 DIAGNOSIS — M25562 Pain in left knee: Secondary | ICD-10-CM | POA: Insufficient documentation

## 2024-03-05 DIAGNOSIS — M1712 Unilateral primary osteoarthritis, left knee: Secondary | ICD-10-CM | POA: Diagnosis not present

## 2024-03-05 DIAGNOSIS — G8929 Other chronic pain: Secondary | ICD-10-CM | POA: Insufficient documentation

## 2024-03-06 ENCOUNTER — Ambulatory Visit: Payer: Self-pay

## 2024-03-06 NOTE — Telephone Encounter (Signed)
  Chief Complaint: low blood sugar, medication side effects  Symptoms: Jittery, diaphoretic Frequency: onset with added glipizide Pertinent Negatives: Patient denies all other symptoms Disposition: [] ED /[] Urgent Care (no appt availability in office) / [] Appointment(In office/virtual)/ []  [facility]/ [] Home Care/ [] Refused Recommended Disposition /[] [facility]/ [x]  Follow-up with PCP Additional Notes:  Patient calling today requesting to review her diabetes medication regimen. Current blood sugar is 138. She was followed by another practice when doctor retired she started with [facility] provider. She was without her diabetes medication for a few months during her PCP transition, she had previously been prescribed Ozempic 2mg  which she states her sugar stablized, A1C stabilized. Due to lack of medication she became hyperglycemic requiring ED admission in January, ED started her on Ozempic 1mg  weekly and metformin 500mg  bid. Glipizide 5mg  was added by Dr. Lorelei Rogers "without blood work". Since starting with 3rd medication, glipizide, her sugars have been going to low-mid 70's after eating, she will feel jittery and not well. She has already taken her Ozempic this morning, plans not to take oral medication today or until follow up with PCP.  Requesting PCP review medications and consider increasing Ozempic to 2mg . Please review and follow up with recommendation. Patient does not feel she needs an office visit at this time.     Copied from CRM [id number]. Topic: Clinical - Medication Question >> Mar 06, 2024  1:15 PM Yolanda T wrote: Reason for CRM: patient called stated she ate and took her meds (Ozempic, Metformin and Glipizide) and her sugar dropped down to 75 and she felt jittery and sweating. Today she took meds (Ozempic, Metformin) after eating and she did not feel the same symptoms. She said she was put on Glipizide 5mg  by Dr Lorelei Rogers last week without doing type of bloodwork. Patient is  requesting a call back to see if provider wants her to stop one of the diabetic meds she is taking. Reason for Disposition  [1] Morning (before breakfast) blood glucose < 80 mg/dL (4.4 mmol/L) AND [0] more than once in past week  Protocols used: Diabetes - Low Blood Sugar-A-AH

## 2024-03-07 NOTE — Telephone Encounter (Signed)
 Called patient to schedule appt. Voice mail box full. Will try calling again later.

## 2024-03-13 DIAGNOSIS — E559 Vitamin D deficiency, unspecified: Secondary | ICD-10-CM | POA: Diagnosis not present

## 2024-03-13 DIAGNOSIS — E119 Type 2 diabetes mellitus without complications: Secondary | ICD-10-CM | POA: Diagnosis not present

## 2024-03-13 DIAGNOSIS — F17200 Nicotine dependence, unspecified, uncomplicated: Secondary | ICD-10-CM | POA: Diagnosis not present

## 2024-03-13 DIAGNOSIS — E782 Mixed hyperlipidemia: Secondary | ICD-10-CM | POA: Diagnosis not present

## 2024-03-14 ENCOUNTER — Encounter: Payer: Self-pay | Admitting: Nurse Practitioner

## 2024-03-14 NOTE — Telephone Encounter (Signed)
 Patient scheduled for tomorrow and is aware of the appointment.

## 2024-03-15 ENCOUNTER — Ambulatory Visit: Admitting: Nurse Practitioner

## 2024-03-15 ENCOUNTER — Encounter: Payer: Self-pay | Admitting: Nurse Practitioner

## 2024-03-15 VITALS — BP 123/84 | HR 91 | Temp 97.9°F | Wt 217.4 lb

## 2024-03-15 DIAGNOSIS — M25562 Pain in left knee: Secondary | ICD-10-CM | POA: Diagnosis not present

## 2024-03-15 DIAGNOSIS — G8929 Other chronic pain: Secondary | ICD-10-CM | POA: Diagnosis not present

## 2024-03-15 NOTE — Progress Notes (Signed)
 BP 123/84   Pulse 91   Temp 97.9 F (36.6 C) (Oral)   Wt 217 lb 6.4 oz (98.6 kg)   SpO2 98%   BMI 38.52 kg/m    Subjective:    Patient ID: Melinda Ross, female    DOB: [birth year], 53 y.o.   MRN: [id number]  HPI: Melinda Ross is a 53 y.o. female  Chief Complaint  Patient presents with   Knee Pain   KNEE PAIN Slipped on the ice. Has been working with a PT at her job.   Duration: months Involved knee: left Mechanism of injury: unknown Location:diffuse Onset: sudden Severity: 7/10  Quality:  aching Frequency: constant Radiation: no Aggravating factors: weight bearing and walking  Alleviating factors: nothing  Status: stable Treatments attempted: ice, ibuprofen , and aleve   Relief with NSAIDs?:  mild Weakness with weight bearing or walking: no Sensation of giving way: no Locking: yes Popping: no Bruising: no Swelling: no Redness: no Paresthesias/decreased sensation: no Fevers: no   Relevant past medical, surgical, family and social history reviewed and updated as indicated. Interim medical history since our last visit reviewed. Allergies and medications reviewed and updated.  Review of Systems  Musculoskeletal:        Left knee pain    Per HPI unless specifically indicated above     Objective:    BP 123/84   Pulse 91   Temp 97.9 F (36.6 C) (Oral)   Wt 217 lb 6.4 oz (98.6 kg)   SpO2 98%   BMI 38.52 kg/m   Wt Readings from Last 3 Encounters:  03/15/24 217 lb 6.4 oz (98.6 kg)  03/01/24 214 lb 3.2 oz (97.2 kg)  12/22/23 210 lb (95.3 kg)    Physical Exam Vitals and nursing note reviewed.  Constitutional:      General: She is not in acute distress.    Appearance: Normal appearance. She is not ill-appearing, toxic-appearing or diaphoretic.  HENT:     Head: Normocephalic.     Right Ear: External ear normal.     Left Ear: External ear normal.     Nose: Nose normal.     Mouth/Throat:     Mouth: Mucous membranes are moist.     Pharynx:  Oropharynx is clear.  Eyes:     General:        Right eye: No discharge.        Left eye: No discharge.     Extraocular Movements: Extraocular movements intact.     Conjunctiva/sclera: Conjunctivae normal.     Pupils: Pupils are equal, round, and reactive to light.  Cardiovascular:     Rate and Rhythm: Normal rate and regular rhythm.     Heart sounds: No murmur heard. Pulmonary:     Effort: Pulmonary effort is normal. No respiratory distress.     Breath sounds: Normal breath sounds. No wheezing or rales.  Musculoskeletal:     Cervical back: Normal range of motion and neck supple.  Skin:    General: Skin is warm and dry.     Capillary Refill: Capillary refill takes less than 2 seconds.  Neurological:     General: No focal deficit present.     Mental Status: She is alert and oriented to person, place, and time. Mental status is at baseline.  Psychiatric:        Mood and Affect: Mood normal.        Behavior: Behavior normal.        Thought Content: Thought  content normal.        Judgment: Judgment normal.     Results for orders placed or performed in visit on 03/01/24  Comp Met (CMET)   Collection Time: 03/01/24 10:13 AM  Result Value Ref Range   Glucose 64 (L) 70 - 99 mg/dL   BUN 13 6 - 24 mg/dL   Creatinine, Ser 1.61 0.57 - 1.00 mg/dL   eGFR 91 >09 UE/AVW/0.98   BUN/Creatinine Ratio 16 9 - 23   Sodium 143 134 - 144 mmol/L   Potassium 3.8 3.5 - 5.2 mmol/L   Chloride 105 96 - 106 mmol/L   CO2 21 20 - 29 mmol/L   Calcium  9.4 8.7 - 10.2 mg/dL   Total Protein 6.6 6.0 - 8.5 g/dL   Albumin 4.2 3.9 - 4.9 g/dL   Globulin, Total 2.4 1.5 - 4.5 g/dL   Bilirubin Total 0.2 0.0 - 1.2 mg/dL   Alkaline Phosphatase 89 44 - 121 IU/L   AST 19 0 - 40 IU/L   ALT 14 0 - 32 IU/L      Assessment & Plan:   Problem List Items Addressed This Visit   None Visit Diagnoses       Chronic pain of left knee    -  Primary   See procedure note below.       Procedure: Left  Knee  Intraarticular Steroid Injection        Diagnosis:   ICD-10-CM   1. Chronic pain of left knee  M25.562    G89.29    See procedure note below.      Physician: @ENCPROV @ Consent:  Risks, benefits, and alternative treatments discussed and all questions were answered.  Patient elected to proceed and verbal consent obtained.  Description: Area prepped and draped using  semi-sterile technique.  Using a anterior/lateral approach, a mixture of 4 cc of  1% lidocaine  & 1 cc of Kenalog 40 was injected into knee joint.  A bandage was then placed over the injection site. Complications: none Post Procedure Instructions: Wound care instructions discussed and patient was instructed to keep area clean and dry.  Signs and symptoms of infection discussed, patient agrees to contact the office ASAP should they occur.  Follow Up: No follow-ups on file.  Follow up plan: No follow-ups on file.

## 2024-04-02 ENCOUNTER — Ambulatory Visit: Payer: BC Managed Care – PPO | Admitting: Nurse Practitioner

## 2024-06-01 ENCOUNTER — Ambulatory Visit (INDEPENDENT_AMBULATORY_CARE_PROVIDER_SITE_OTHER): Admitting: Nurse Practitioner

## 2024-06-01 ENCOUNTER — Other Ambulatory Visit: Payer: Self-pay

## 2024-06-01 VITALS — BP 131/87 | HR 89 | Temp 98.0°F | Ht 63.0 in | Wt 219.2 lb

## 2024-06-01 DIAGNOSIS — E119 Type 2 diabetes mellitus without complications: Secondary | ICD-10-CM

## 2024-06-01 DIAGNOSIS — M329 Systemic lupus erythematosus, unspecified: Secondary | ICD-10-CM | POA: Diagnosis not present

## 2024-06-01 DIAGNOSIS — Z6841 Body Mass Index (BMI) 40.0 and over, adult: Secondary | ICD-10-CM

## 2024-06-01 DIAGNOSIS — Z7985 Long-term (current) use of injectable non-insulin antidiabetic drugs: Secondary | ICD-10-CM

## 2024-06-01 DIAGNOSIS — E1165 Type 2 diabetes mellitus with hyperglycemia: Secondary | ICD-10-CM

## 2024-06-01 DIAGNOSIS — Z1211 Encounter for screening for malignant neoplasm of colon: Secondary | ICD-10-CM

## 2024-06-01 DIAGNOSIS — E785 Hyperlipidemia, unspecified: Secondary | ICD-10-CM

## 2024-06-01 DIAGNOSIS — Z0000 Encounter for general adult medical examination without abnormal findings: Secondary | ICD-10-CM | POA: Diagnosis not present

## 2024-06-01 LAB — MICROALBUMIN, URINE WAIVED
Creatinine, Urine Waived: 300 mg/dL (ref 10–300)
Microalb, Ur Waived: 80 mg/L — ABNORMAL HIGH (ref 0–19)

## 2024-06-01 MED ORDER — LOSARTAN POTASSIUM 25 MG PO TABS: 25.0000 mg | ORAL_TABLET | Freq: Every day | ORAL | 1 refills | Status: DC

## 2024-06-01 MED ORDER — BLOOD GLUCOSE MONITORING SUPPL DEVI: 1.0000 | Freq: Three times a day (TID) | 0 refills | Status: DC

## 2024-06-01 MED ORDER — BLOOD GLUCOSE TEST VI STRP: 1.0000 | ORAL_STRIP | Freq: Three times a day (TID) | 11 refills | Status: AC

## 2024-06-01 MED ORDER — ATORVASTATIN CALCIUM 20 MG PO TABS: 20.0000 mg | ORAL_TABLET | Freq: Every day | ORAL | 1 refills | Status: AC

## 2024-06-01 MED ORDER — LANCET DEVICE MISC: 1.0000 | Freq: Three times a day (TID) | 1 refills | Status: AC

## 2024-06-01 MED ORDER — LANCETS MISC. MISC: 1.0000 | Freq: Three times a day (TID) | 11 refills | Status: AC

## 2024-06-01 MED ORDER — OZEMPIC (1 MG/DOSE) 2 MG/1.5ML ~~LOC~~ SOPN: 1.0000 mg | PEN_INJECTOR | SUBCUTANEOUS | 1 refills | Status: DC

## 2024-06-01 NOTE — Assessment & Plan Note (Signed)
 Chronic.  Would like to increase her Ozempic  to 1mg . New prescription sent to the pharmacy.  Labs ordered today.  Continue with Metformin .  Losartan  started for Kidney protection.  Follow up in 3 months.  Call sooner if concerns arise.

## 2024-06-01 NOTE — Patient Instructions (Signed)
 Dear Hadassah,  Please call and get your mammogram results and send them to me in MyChart.  Please and Thank you, Darice

## 2024-06-01 NOTE — Assessment & Plan Note (Signed)
 Recommended eating smaller high protein, low fat meals more frequently and exercising 30 mins a day 5 times a week with a goal of 10-15lb weight loss in the next 3 months.

## 2024-06-01 NOTE — Assessment & Plan Note (Signed)
 Chronic.  Encouraged patient to restart Atorvastatin  daily.  Labs ordered today. Will make recommendations based on results.

## 2024-06-01 NOTE — Assessment & Plan Note (Signed)
 Patient has an upcoming appt with Rheumatology to restart Plaquenil .  She agrees and plans to make an appointment today.

## 2024-06-01 NOTE — Progress Notes (Signed)
 BP 131/87   Pulse 89   Temp 98 F (36.7 C) (Oral)   Ht 5' 3 (1.6 m)   Wt 219 lb 3.2 oz (99.4 kg)   SpO2 99%   BMI 38.83 kg/m    Subjective:    Patient ID: Melinda Ross, female    DOB: 1971-07-23, 53 y.o.   MRN: [id number]  HPI: Melinda Ross is a 53 y.o. female presenting on 06/01/2024 for comprehensive medical examination. Current medical complaints include:Ozempic   She currently lives with: Menopausal Symptoms: no  HYPERTENSION without Chronic Kidney Disease Hypertension status: controlled  Satisfied with current treatment? yes Duration of hypertension: years BP monitoring frequency:  not checking BP range:  BP medication side effects:  no Medication compliance: excellent compliance Previous BP meds:none Aspirin: no Recurrent headaches: no Visual changes: no Palpitations: no Dyspnea: no Chest pain: no Lower extremity edema: no Dizzy/lightheaded: no  DIABETES Patient is on Ozempic  0.5mg  weekly and metformin  1000mg  BID.  Tolerating them well.  Would like to increase her Ozempic  1mg . Hypoglycemic episodes:no Polydipsia/polyuria: no Visual disturbance: no Chest pain: no Paresthesias: no Glucose Monitoring: no  Accucheck frequency: Not Checking  Fasting glucose:  Post prandial:  Evening:  Before meals: Taking Insulin ?: no  Long acting insulin :  Short acting insulin : Blood Pressure Monitoring: not checking Retinal Examination: Has an appt in August- [facility] Exam: Up to Date Diabetic Education: Not Completed Pneumovax: Up to Date Influenza: Up to Date Aspirin: no   Depression Screen done today and results listed below:     06/01/2024    8:17 AM 03/15/2024    1:37 PM 03/01/2024    9:49 AM 07/30/2022    3:20 PM 04/29/2022    2:31 PM  Depression screen PHQ 2/9  Decreased Interest 0 0 0 2 1  Down, Depressed, Hopeless 0 0 0 3 0  PHQ - 2 Score 0 0 0 5 1  Altered sleeping 3 0 2 3   Tired, decreased energy 3 0 2 3   Change in appetite 1 1 3  0    Feeling bad or failure about yourself  0 0 0 2   Trouble concentrating 0 0 0 0   Moving slowly or fidgety/restless 0 0 0 0   Suicidal thoughts 0 0 0 0   PHQ-9 Score 7 1 7 13    Difficult doing work/chores Somewhat difficult Not difficult at all Not difficult at all Somewhat difficult     The patient does not have a history of falls. I did complete a risk assessment for falls. A plan of care for falls was documented.   Past Medical History:  Past Medical History:  Diagnosis Date   Anemia    Anxiety    Arthritis    Attention deficit disorder (ADD)    Collagen vascular disease (HCC)    lupus   Diabetes mellitus without complication (HCC)    DVT of leg (deep venous thrombosis) (HCC)    Genital herpes 2015   Hyperlipidemia    Lupus    SEES DR RONA GLENN  Discoid   Mild depression    Numbness    Pulmonary emboli (HCC)    UTI (urinary tract infection)     Surgical History:  Past Surgical History:  Procedure Laterality Date   ABLATION     2017 Dr. Leonce Dancer    CESAREAN SECTION  2003   [facility]   DG TOES*L* Bilateral    2 bones removed in small toes 2002  DILATATION & CURETTAGE/HYSTEROSCOPY WITH MYOSURE N/A 12/24/2016   Procedure: DILATATION & CURETTAGE/HYSTEROSCOPY WITH MYOSURE;  Surgeon: Glory High, MD;  Location: [facility];  Service: Gynecology;  Laterality: N/A;   DILATION AND CURETTAGE OF UTERUS     HYSTEROSCOPY WITH NOVASURE N/A 03/25/2017   Procedure: HYSTEROSCOPY WITH NOVASURE;  Surgeon: High Glory, MD;  Location: [facility];  Service: Gynecology;  Laterality: N/A;   TUBAL LIGATION  2003   [facility]    Medications:  Current Outpatient Medications on File Prior to Visit  Medication Sig   Cholecalciferol  1.25 MG (50000 UT) capsule Take 1 capsule (50,000 Units total) by mouth once a week. d3   metFORMIN  (GLUCOPHAGE ) 500 MG tablet Take 2 tablets (1,000 mg total) by mouth 2 (two) times daily with a meal.   glucose blood test strip  Use as instructed check tid E 11.9   No current facility-administered medications on file prior to visit.    Allergies:  No Known Allergies  Social History:  Social History   Socioeconomic History   Marital status: Single    Spouse name: Not on file   Number of children: 3   Years of education: Not on file   Highest education level: Some college, no degree  Occupational History    Comment: [employer]  Tobacco Use   Smoking status: Every Day    Current packs/day: 0.50    Average packs/day: 0.5 packs/day for 37.5 years (18.8 ttl pk-yrs)    Types: Cigarettes    Start date: 11/22/1986    Passive exposure: Never   Smokeless tobacco: Never  Vaping Use   Vaping status: Never Used  Substance and Sexual Activity   Alcohol use: Yes    Comment: social - once every 2-3 months    Drug use: No   Sexual activity: Not Currently    Birth control/protection: Post-menopausal, Surgical  Other Topics Concern   Not on file  Social History Narrative   Patient is single and works at [employer]. Patient has three children 1 dog.   Right handed.    Social Drivers of Corporate investment banker Strain: Low Risk  (06/01/2024)   Overall Financial Resource Strain (CARDIA)    Difficulty of Paying Living Expenses: Not very hard  Food Insecurity: Food Insecurity Present (06/01/2024)   Hunger Vital Sign    Worried About Running Out of Food in the Last Year: Sometimes true    Ran Out of Food in the Last Year: Patient declined  Transportation Needs: No Transportation Needs (06/01/2024)   PRAPARE - Administrator, Civil Service (Medical): No    Lack of Transportation (Non-Medical): No  Physical Activity: Insufficiently Active (06/01/2024)   Exercise Vital Sign    Days of Exercise per Week: 5 days    Minutes of Exercise per Session: 20 min  Stress: No Stress Concern Present (06/01/2024)   [employer] of Occupational Health - Occupational Stress Questionnaire    Feeling of Stress: Not  at all  Social Connections: Unknown (06/01/2024)   Social Connection and Isolation Panel    Frequency of Communication with Friends and Family: Patient declined    Frequency of Social Gatherings with Friends and Family: Patient declined    Attends Religious Services: Never    Database administrator or Organizations: No    Attends Banker Meetings: Never    Marital Status: Never married  Intimate Partner Violence: Not At Risk (06/01/2024)   Humiliation, Afraid, Rape, and Kick questionnaire  Fear of Current or Ex-Partner: No    Emotionally Abused: No    Physically Abused: No    Sexually Abused: No   Social History   Tobacco Use  Smoking Status Every Day   Current packs/day: 0.50   Average packs/day: 0.5 packs/day for 37.5 years (18.8 ttl pk-yrs)   Types: Cigarettes   Start date: 11/22/1986   Passive exposure: Never  Smokeless Tobacco Never   Social History   Substance and Sexual Activity  Alcohol Use Yes   Comment: social - once every 2-3 months     Family History:  Family History  Problem Relation Age of Onset   Lung cancer Mother    COPD Mother    Heart attack Mother    Cancer Mother    Hearing loss Father    Heart disease Father    Hypertension Father    Heart disease Sister    Hypertension Sister    Depression Daughter    Diabetes Maternal Aunt    Other Maternal Aunt    Kidney failure Maternal Aunt    High blood pressure Brother    Diabetes Brother    Kidney disease Brother    Arthritis Brother    Birth defects Brother    Breast cancer Neg Hx     Past medical history, surgical history, medications, allergies, family history and social history reviewed with patient today and changes made to appropriate areas of the chart.   Review of Systems  HENT:         Denies vision changes.  Eyes:  Negative for blurred vision and double vision.  Respiratory:  Negative for shortness of breath.   Cardiovascular:  Negative for chest pain, palpitations and  leg swelling.  Neurological:  Negative for dizziness, tingling and headaches.  Endo/Heme/Allergies:  Negative for polydipsia.       Denies Polyuria   All other ROS negative except what is listed above and in the HPI.      Objective:    BP 131/87   Pulse 89   Temp 98 F (36.7 C) (Oral)   Ht 5' 3 (1.6 m)   Wt 219 lb 3.2 oz (99.4 kg)   SpO2 99%   BMI 38.83 kg/m   Wt Readings from Last 3 Encounters:  06/01/24 219 lb 3.2 oz (99.4 kg)  03/15/24 217 lb 6.4 oz (98.6 kg)  03/01/24 214 lb 3.2 oz (97.2 kg)    Physical Exam Vitals and nursing note reviewed.  Constitutional:      General: She is awake. She is not in acute distress.    Appearance: Normal appearance. She is well-developed. She is obese. She is not ill-appearing.  HENT:     Head: Normocephalic and atraumatic.     Right Ear: Hearing, tympanic membrane, ear canal and external ear normal. No drainage.     Left Ear: Hearing, tympanic membrane, ear canal and external ear normal. No drainage.     Nose: Nose normal.     Right Sinus: No maxillary sinus tenderness or frontal sinus tenderness.     Left Sinus: No maxillary sinus tenderness or frontal sinus tenderness.     Mouth/Throat:     Mouth: Mucous membranes are moist.     Pharynx: Oropharynx is clear. Uvula midline. No pharyngeal swelling, oropharyngeal exudate or posterior oropharyngeal erythema.  Eyes:     General: Lids are normal.        Right eye: No discharge.        Left eye:  No discharge.     Extraocular Movements: Extraocular movements intact.     Conjunctiva/sclera: Conjunctivae normal.     Pupils: Pupils are equal, round, and reactive to light.     Visual Fields: Right eye visual fields normal and left eye visual fields normal.  Neck:     Thyroid : No thyromegaly.     Vascular: No carotid bruit.     Trachea: Trachea normal.  Cardiovascular:     Rate and Rhythm: Normal rate and regular rhythm.     Heart sounds: Normal heart sounds. No murmur heard.    No  gallop.  Pulmonary:     Effort: Pulmonary effort is normal. No accessory muscle usage or respiratory distress.     Breath sounds: Normal breath sounds.  Chest:  Breasts:    Right: Normal.     Left: Normal.  Abdominal:     General: Bowel sounds are normal.     Palpations: Abdomen is soft. There is no hepatomegaly or splenomegaly.     Tenderness: There is no abdominal tenderness.  Musculoskeletal:        General: Normal range of motion.     Cervical back: Normal range of motion and neck supple.     Right lower leg: No edema.     Left lower leg: No edema.  Lymphadenopathy:     Head:     Right side of head: No submental, submandibular, tonsillar, preauricular or posterior auricular adenopathy.     Left side of head: No submental, submandibular, tonsillar, preauricular or posterior auricular adenopathy.     Cervical: No cervical adenopathy.     Upper Body:     Right upper body: No supraclavicular, axillary or pectoral adenopathy.     Left upper body: No supraclavicular, axillary or pectoral adenopathy.  Skin:    General: Skin is warm and dry.     Capillary Refill: Capillary refill takes less than 2 seconds.     Findings: No rash.  Neurological:     Mental Status: She is alert and oriented to person, place, and time.     Gait: Gait is intact.  Psychiatric:        Attention and Perception: Attention normal.        Mood and Affect: Mood normal.        Speech: Speech normal.        Behavior: Behavior normal. Behavior is cooperative.        Thought Content: Thought content normal.        Judgment: Judgment normal.     Results for orders placed or performed in visit on 03/01/24  Comp Met (CMET)   Collection Time: 03/01/24 10:13 AM  Result Value Ref Range   Glucose 64 (L) 70 - 99 mg/dL   BUN 13 6 - 24 mg/dL   Creatinine, Ser 9.20 0.57 - 1.00 mg/dL   eGFR 91 >40 fO/fpw/8.26   BUN/Creatinine Ratio 16 9 - 23   Sodium 143 134 - 144 mmol/L   Potassium 3.8 3.5 - 5.2 mmol/L    Chloride 105 96 - 106 mmol/L   CO2 21 20 - 29 mmol/L   Calcium  9.4 8.7 - 10.2 mg/dL   Total Protein 6.6 6.0 - 8.5 g/dL   Albumin 4.2 3.9 - 4.9 g/dL   Globulin, Total 2.4 1.5 - 4.5 g/dL   Bilirubin Total 0.2 0.0 - 1.2 mg/dL   Alkaline Phosphatase 89 44 - 121 IU/L   AST 19 0 - 40 IU/L  ALT 14 0 - 32 IU/L      Assessment & Plan:   Problem List Items Addressed This Visit       Endocrine   Type 2 diabetes mellitus with hyperglycemia, without long-term current use of insulin  (HCC)   Chronic.  Would like to increase her Ozempic  to 1mg . New prescription sent to the pharmacy.  Labs ordered today.  Continue with Metformin .  Losartan  started for Kidney protection.  Follow up in 3 months.  Call sooner if concerns arise.       Relevant Medications   Semaglutide , 1 MG/DOSE, (OZEMPIC , 1 MG/DOSE,) 2 MG/1.5ML SOPN   losartan  (COZAAR ) 25 MG tablet   atorvastatin  (LIPITOR) 20 MG tablet   Other Relevant Orders   Hemoglobin A1c   Microalbumin, Urine Waived   Diabetes mellitus treated with injections of non-insulin  medication (HCC)   Relevant Medications   Semaglutide , 1 MG/DOSE, (OZEMPIC , 1 MG/DOSE,) 2 MG/1.5ML SOPN   losartan  (COZAAR ) 25 MG tablet   atorvastatin  (LIPITOR) 20 MG tablet     Other   SLE (systemic lupus erythematosus) (HCC)   Patient has an upcoming appt with Rheumatology to restart Plaquenil .  She agrees and plans to make an appointment today.        Hyperlipidemia   Chronic.  Encouraged patient to restart Atorvastatin  daily.  Labs ordered today. Will make recommendations based on results.      Relevant Medications   losartan  (COZAAR ) 25 MG tablet   atorvastatin  (LIPITOR) 20 MG tablet   Other Relevant Orders   Lipid panel   Morbid obesity with BMI of 40.0-44.9, adult (HCC)   Recommended eating smaller high protein, low fat meals more frequently and exercising 30 mins a day 5 times a week with a goal of 10-15lb weight loss in the next 3 months.       Relevant  Medications   Semaglutide , 1 MG/DOSE, (OZEMPIC , 1 MG/DOSE,) 2 MG/1.5ML SOPN   Other Visit Diagnoses       Annual physical exam    -  Primary   Health maintenance reviewed during visit today.  Labs ordered.  Vaccines reviewed.  Colonoscopy and Mammogram up to date. Will get PAP at next visit.   Relevant Orders   CBC with Differential/Platelet   Comprehensive metabolic panel with GFR   Lipid panel   TSH   Hemoglobin A1c   Cytology - PAP     Screening for colon cancer       Relevant Orders   Ambulatory referral to Gastroenterology        Follow up plan: Return in about 3 months (around 09/01/2024) for HTN, HLD, DM2 FU.   LABORATORY TESTING:  - Pap smear: Will get at next visit  IMMUNIZATIONS:   - Tdap: Tetanus vaccination status reviewed: last tetanus booster within 10 years. - Influenza: Postponed to flu season - Pneumovax: Up to date - Prevnar: Up to date - COVID: Not applicable - HPV: Not applicable - Shingrix vaccine: Refused  SCREENING: -Mammogram: Up to date - will get report from her employer - Colonoscopy: Ordered today  - Bone Density: Not applicable  -Hearing Test: Not applicable  -Spirometry: Not applicable   PATIENT COUNSELING:   Advised to take 1 mg of folate supplement per day if capable of pregnancy.   Sexuality: Discussed sexually transmitted diseases, partner selection, use of condoms, avoidance of unintended pregnancy  and contraceptive alternatives.   Advised to avoid cigarette smoking.  I discussed with the patient that most people either  abstain from alcohol or drink within safe limits (<=14/week and <=4 drinks/occasion for males, <=7/weeks and <= 3 drinks/occasion for females) and that the risk for alcohol disorders and other health effects rises proportionally with the number of drinks per week and how often a drinker exceeds daily limits.  Discussed cessation/primary prevention of drug use and availability of treatment for abuse.   Diet:  Encouraged to adjust caloric intake to maintain  or achieve ideal body weight, to reduce intake of dietary saturated fat and total fat, to limit sodium intake by avoiding high sodium foods and not adding table salt, and to maintain adequate dietary potassium and calcium  preferably from fresh fruits, vegetables, and low-fat dairy products.    stressed the importance of regular exercise  Injury prevention: Discussed safety belts, safety helmets, smoke detector, smoking near bedding or upholstery.   Dental health: Discussed importance of regular tooth brushing, flossing, and dental visits.    NEXT PREVENTATIVE PHYSICAL DUE IN 1 YEAR. Return in about 3 months (around 09/01/2024) for HTN, HLD, DM2 FU.

## 2024-06-02 LAB — LIPID PANEL
Chol/HDL Ratio: 7.3 ratio — ABNORMAL HIGH (ref 0.0–4.4)
Cholesterol, Total: 213 mg/dL — ABNORMAL HIGH (ref 100–199)
HDL: 29 mg/dL — ABNORMAL LOW (ref >39)
LDL Chol Calc (NIH): 165 mg/dL — ABNORMAL HIGH (ref 0–99)
Triglycerides: 106 mg/dL (ref 0–149)
VLDL Cholesterol Cal: 19 mg/dL (ref 5–40)

## 2024-06-02 LAB — CBC WITH DIFFERENTIAL/PLATELET
Basophils Absolute: 0 x10E3/uL (ref 0.0–0.2)
Basos: 0 %
EOS (ABSOLUTE): 0.1 x10E3/uL (ref 0.0–0.4)
Eos: 1 %
Hematocrit: 43.8 % (ref 34.0–46.6)
Hemoglobin: 14.5 g/dL (ref 11.1–15.9)
Immature Grans (Abs): 0 x10E3/uL (ref 0.0–0.1)
Immature Granulocytes: 0 %
Lymphocytes Absolute: 1.3 x10E3/uL (ref 0.7–3.1)
Lymphs: 30 %
MCH: 33.4 pg — ABNORMAL HIGH (ref 26.6–33.0)
MCHC: 33.1 g/dL (ref 31.5–35.7)
MCV: 101 fL — ABNORMAL HIGH (ref 79–97)
Monocytes Absolute: 0.4 x10E3/uL (ref 0.1–0.9)
Monocytes: 9 %
Neutrophils Absolute: 2.6 x10E3/uL (ref 1.4–7.0)
Neutrophils: 60 %
Platelets: 208 x10E3/uL (ref 150–450)
RBC: 4.34 x10E6/uL (ref 3.77–5.28)
RDW: 13.5 % (ref 11.7–15.4)
WBC: 4.4 x10E3/uL (ref 3.4–10.8)

## 2024-06-02 LAB — TSH: TSH: 5 u[IU]/mL — ABNORMAL HIGH (ref 0.450–4.500)

## 2024-06-02 LAB — COMPREHENSIVE METABOLIC PANEL WITH GFR
ALT: 12 IU/L (ref 0–32)
AST: 21 IU/L (ref 0–40)
Albumin: 4 g/dL (ref 3.9–4.9)
Alkaline Phosphatase: 89 IU/L (ref 44–121)
BUN/Creatinine Ratio: 13 (ref 9–23)
BUN: 13 mg/dL (ref 6–24)
Bilirubin Total: 0.3 mg/dL (ref 0.0–1.2)
CO2: 20 mmol/L (ref 20–29)
Calcium: 9.1 mg/dL (ref 8.7–10.2)
Chloride: 108 mmol/L — ABNORMAL HIGH (ref 96–106)
Creatinine, Ser: 1 mg/dL (ref 0.57–1.00)
Globulin, Total: 2.7 g/dL (ref 1.5–4.5)
Glucose: 88 mg/dL (ref 70–99)
Potassium: 3.9 mmol/L (ref 3.5–5.2)
Sodium: 144 mmol/L (ref 134–144)
Total Protein: 6.7 g/dL (ref 6.0–8.5)
eGFR: 69 mL/min/1.73 (ref >59)

## 2024-06-02 LAB — HEMOGLOBIN A1C
Est. average glucose Bld gHb Est-mCnc: 123 mg/dL
Hgb A1c MFr Bld: 5.9 % — ABNORMAL HIGH (ref 4.8–5.6)

## 2024-06-04 ENCOUNTER — Ambulatory Visit: Payer: Self-pay | Admitting: Nurse Practitioner

## 2024-06-04 DIAGNOSIS — R7989 Other specified abnormal findings of blood chemistry: Secondary | ICD-10-CM

## 2024-06-06 ENCOUNTER — Other Ambulatory Visit: Payer: Self-pay

## 2024-06-06 NOTE — Progress Notes (Signed)
   06/06/2024  Patient ID: Melinda Ross, female   DOB: Apr 13, 1971, 53 y.o.   MRN: [id number]  Subjective/Objective Initial outreach to discuss management of diabetes  Diabetes Management -Current medications:  Metformin  1000mg  BID, Ozempic  1mg  weekly -Ozempic  recently increased to 1mg  weekly and patient took 1st injection of this dose a few days ago and endorses tolerating well.  She was previously on the 2mg  dose but went without with medication for quite sometime and titration needed to be restarted. -A1c 7/11 down to 5.9% from 8.2% last year -Patient just got new glucometer and testing supplies, so she has not yet started checking home BG -States she had lost 70lbs when previously at 2mg  dose of Ozempic  but did gain some weight back and cholesterol increased when this occurred -Taking losartan  25mg  daily, which is providing renal protection  Hyperlipidemia -Current medications:  atorvastatin  20mg  daily -LDL elevated at 165 7/11, so statin therapy resumed  Assessment/Plan  Diabetes Management -Currently controlled -Continue current regimen at this time; could consider increasing Ozempic  to 2mg  if patient continues to tolerate 1mg  dose well -Begin checking FBG at least 3x/week  Hyperlipidemia -Continue  current regimen at this time -Recommend follow-up lipid panel and CMP in 6-8 weeks; if LDL >70, consider increasing atorvastatin  to 40mg  daily  Follow-up:  6 weeks  Melinda Ross, PharmD, DPLA

## 2024-06-22 ENCOUNTER — Encounter: Payer: Self-pay | Admitting: Nurse Practitioner

## 2024-06-25 ENCOUNTER — Ambulatory Visit: Payer: Self-pay

## 2024-06-25 DIAGNOSIS — K529 Noninfective gastroenteritis and colitis, unspecified: Secondary | ICD-10-CM | POA: Diagnosis not present

## 2024-06-25 DIAGNOSIS — R1084 Generalized abdominal pain: Secondary | ICD-10-CM | POA: Diagnosis not present

## 2024-06-25 NOTE — Telephone Encounter (Signed)
 FYI Only or Action Required?: FYI only for provider.  Patient was last seen in primary care on 06/01/2024 by Melvin Pao, NP.  Called Nurse Triage reporting Abdominal Cramping.  Symptoms began several days ago.  Interventions attempted: Rest, hydration, or home remedies.  Symptoms are: unchanged.  Triage Disposition: See Physician Within 24 Hours  Patient/caregiver understands and will follow disposition?: Unsure  Copied from CRM [id number]. Topic: Clinical - Red Word Triage >> Jun 25, 2024  8:12 AM Berwyn MATSU wrote: Red Word that prompted transfer to Nurse Triage: painful stomach cramps, abdominal issues, diarrhea and discomfort. Reason for Disposition . [1] MODERATE pain (e.g., interferes with normal activities) AND [2] pain comes and goes (cramps) AND [3] present > 24 hours  (Exception: Pain with Vomiting or Diarrhea - see that Guideline.)  Answer Assessment - Initial Assessment Questions 1. LOCATION: Where does it hurt?      Lower abd pain 2. RADIATION: Does the pain shoot anywhere else? (e.g., chest, back)     denies 3. ONSET: When did the pain begin? (e.g., minutes, hours or days ago)      6 days approx 4. SUDDEN: Gradual or sudden onset?     sudden 5. PATTERN Does the pain come and go, or is it constant?     intermittent 6. SEVERITY: How bad is the pain?  (e.g., Scale 1-10; mild, moderate, or severe)     7 7. RECURRENT SYMPTOM: Have you ever had this type of stomach pain before? If Yes, ask: When was the last time? and What happened that time?      denies 8. CAUSE: What do you think is causing the stomach pain? (e.g., gallstones, recent abdominal surgery)     Unsure, maybe medicine-states a kidney medication 9. RELIEVING/AGGRAVATING FACTORS: What makes it better or worse? (e.g., antacids, bending or twisting motion, bowel movement)     denies 10. OTHER SYMPTOMS: Do you have any other symptoms? (e.g., back pain, diarrhea, fever, urination pain,  vomiting)       Diarrhea, light colored stool  Denies N/V. States that light colored stool occurred about 4 days ago and has not happened since. Pt believes it is d/t her medication that she takes for her kidneys. Pt advised that she needs to be seen in the next 24 hours. Pt requested appt, scheduled. Pt states that she may also go to the UC.  Protocols used: Abdominal Pain - Female-A-AH

## 2024-06-26 ENCOUNTER — Encounter: Payer: Self-pay | Admitting: Nurse Practitioner

## 2024-06-26 ENCOUNTER — Ambulatory Visit: Admitting: Nurse Practitioner

## 2024-06-26 VITALS — BP 114/78 | HR 84 | Temp 97.7°F | Ht 63.0 in | Wt 218.2 lb

## 2024-06-26 DIAGNOSIS — R1032 Left lower quadrant pain: Secondary | ICD-10-CM | POA: Diagnosis not present

## 2024-06-26 DIAGNOSIS — R197 Diarrhea, unspecified: Secondary | ICD-10-CM | POA: Diagnosis not present

## 2024-06-26 NOTE — Progress Notes (Signed)
 BP 114/78   Pulse 84   Temp 97.7 F (36.5 C) (Oral)   Ht 5' 3 (1.6 m)   Wt 218 lb 3.2 oz (99 kg)   SpO2 100%   BMI 38.65 kg/m    Subjective:    Patient ID: Melinda Ross, female    DOB: 06/04/1971, 53 y.o.   MRN: [id number]  HPI: SONNIE BIAS is a 53 y.o. female  Chief Complaint  Patient presents with   Abdominal Pain    Cramps. Onset Tuesday.   Diarrhea   Fatigue   ABDOMINAL ISSUES Patient states she isn't have the sharp pain.  She is having a cramping like she would need to poop but then nothing. The diarrhea has resolved.  Duration: 1 week Nature: cramping Location: LLQ and RLQ  Severity: 7/10  Radiation: no Episode duration:  Frequency: constant Alleviating factors: sleep Aggravating factors: pressing on the stomach Treatments attempted: none Constipation: no Diarrhea: yes Episodes of diarrhea/day: Mucous in the stool: no Heartburn: no Bloating:no Flatulence: no Nausea: no Vomiting: no Episodes of vomit/day: Melena or hematochezia: no Rash: no Jaundice: no Fever: no Weight loss: no  Relevant past medical, surgical, family and social history reviewed and updated as indicated. Interim medical history since our last visit reviewed. Allergies and medications reviewed and updated.  Review of Systems  Constitutional:  Negative for fever.  Gastrointestinal:  Positive for abdominal pain and diarrhea. Negative for constipation, nausea and vomiting.    Per HPI unless specifically indicated above     Objective:    BP 114/78   Pulse 84   Temp 97.7 F (36.5 C) (Oral)   Ht 5' 3 (1.6 m)   Wt 218 lb 3.2 oz (99 kg)   SpO2 100%   BMI 38.65 kg/m   Wt Readings from Last 3 Encounters:  06/26/24 218 lb 3.2 oz (99 kg)  06/01/24 219 lb 3.2 oz (99.4 kg)  03/15/24 217 lb 6.4 oz (98.6 kg)    Physical Exam Vitals and nursing note reviewed.  Constitutional:      General: She is not in acute distress.    Appearance: Normal appearance. She is normal  weight. She is not ill-appearing, toxic-appearing or diaphoretic.  HENT:     Head: Normocephalic.     Right Ear: External ear normal.     Left Ear: External ear normal.     Nose: Nose normal.     Mouth/Throat:     Mouth: Mucous membranes are moist.     Pharynx: Oropharynx is clear.  Eyes:     General:        Right eye: No discharge.        Left eye: No discharge.     Extraocular Movements: Extraocular movements intact.     Conjunctiva/sclera: Conjunctivae normal.     Pupils: Pupils are equal, round, and reactive to light.  Cardiovascular:     Rate and Rhythm: Normal rate and regular rhythm.     Heart sounds: No murmur heard. Pulmonary:     Effort: Pulmonary effort is normal. No respiratory distress.     Breath sounds: Normal breath sounds. No wheezing or rales.  Abdominal:     Tenderness: There is abdominal tenderness in the left lower quadrant. There is guarding. There is no right CVA tenderness, left CVA tenderness or rebound. Negative signs include Murphy's sign, Rovsing's sign, McBurney's sign, psoas sign and obturator sign.  Musculoskeletal:     Cervical back: Normal range of motion and  neck supple.  Skin:    General: Skin is warm and dry.     Capillary Refill: Capillary refill takes less than 2 seconds.  Neurological:     General: No focal deficit present.     Mental Status: She is alert and oriented to person, place, and time. Mental status is at baseline.  Psychiatric:        Mood and Affect: Mood normal.        Behavior: Behavior normal.        Thought Content: Thought content normal.        Judgment: Judgment normal.     Results for orders placed or performed in visit on 06/01/24  Microalbumin, Urine Waived   Collection Time: 06/01/24  8:38 AM  Result Value Ref Range   Microalb, Ur Waived 80 (H) 0 - 19 mg/L   Creatinine, Urine Waived 300 10 - 300 mg/dL   Microalb/Creat Ratio 30-300 (H) <30 mg/g  CBC with Differential/Platelet   Collection Time: 06/01/24  8:39  AM  Result Value Ref Range   WBC 4.4 3.4 - 10.8 x10E3/uL   RBC 4.34 3.77 - 5.28 x10E6/uL   Hemoglobin 14.5 11.1 - 15.9 g/dL   Hematocrit 56.1 65.9 - 46.6 %   MCV 101 (H) 79 - 97 fL   MCH 33.4 (H) 26.6 - 33.0 pg   MCHC 33.1 31.5 - 35.7 g/dL   RDW 86.4 88.2 - 84.5 %   Platelets 208 150 - 450 x10E3/uL   Neutrophils 60 Not Estab. %   Lymphs 30 Not Estab. %   Monocytes 9 Not Estab. %   Eos 1 Not Estab. %   Basos 0 Not Estab. %   Neutrophils Absolute 2.6 1.4 - 7.0 x10E3/uL   Lymphocytes Absolute 1.3 0.7 - 3.1 x10E3/uL   Monocytes Absolute 0.4 0.1 - 0.9 x10E3/uL   EOS (ABSOLUTE) 0.1 0.0 - 0.4 x10E3/uL   Basophils Absolute 0.0 0.0 - 0.2 x10E3/uL   Immature Granulocytes 0 Not Estab. %   Immature Grans (Abs) 0.0 0.0 - 0.1 x10E3/uL  Comprehensive metabolic panel with GFR   Collection Time: 06/01/24  8:39 AM  Result Value Ref Range   Glucose 88 70 - 99 mg/dL   BUN 13 6 - 24 mg/dL   Creatinine, Ser 8.99 0.57 - 1.00 mg/dL   eGFR 69 >40 fO/fpw/8.26   BUN/Creatinine Ratio 13 9 - 23   Sodium 144 134 - 144 mmol/L   Potassium 3.9 3.5 - 5.2 mmol/L   Chloride 108 (H) 96 - 106 mmol/L   CO2 20 20 - 29 mmol/L   Calcium  9.1 8.7 - 10.2 mg/dL   Total Protein 6.7 6.0 - 8.5 g/dL   Albumin 4.0 3.9 - 4.9 g/dL   Globulin, Total 2.7 1.5 - 4.5 g/dL   Bilirubin Total 0.3 0.0 - 1.2 mg/dL   Alkaline Phosphatase 89 44 - 121 IU/L   AST 21 0 - 40 IU/L   ALT 12 0 - 32 IU/L  Lipid panel   Collection Time: 06/01/24  8:39 AM  Result Value Ref Range   Cholesterol, Total 213 (H) 100 - 199 mg/dL   Triglycerides 893 0 - 149 mg/dL   HDL 29 (L) >60 mg/dL   VLDL Cholesterol Cal 19 5 - 40 mg/dL   LDL Chol Calc (NIH) 834 (H) 0 - 99 mg/dL   Chol/HDL Ratio 7.3 (H) 0.0 - 4.4 ratio  TSH   Collection Time: 06/01/24  8:39 AM  Result Value Ref  Range   TSH 5.000 (H) 0.450 - 4.500 uIU/mL  Hemoglobin A1c   Collection Time: 06/01/24  8:39 AM  Result Value Ref Range   Hgb A1c MFr Bld 5.9 (H) 4.8 - 5.6 %   Est. average  glucose Bld gHb Est-mCnc 123 mg/dL      Assessment & Plan:   Problem List Items Addressed This Visit   None Visit Diagnoses       LLQ pain    -  Primary   Ongoing pain with diarrhea x 1 week.  Pain with palpation of LLQ. Labs ordered. STAT CT ordered to rule out diverticulitis.   Relevant Orders   CT ABDOMEN PELVIS W CONTRAST   CBC w/Diff   Comp Met (CMET)   Lipase     Diarrhea, unspecified type       Relevant Orders   CT ABDOMEN PELVIS W CONTRAST   CBC w/Diff   Comp Met (CMET)   Lipase        Follow up plan: No follow-ups on file.

## 2024-06-26 NOTE — Patient Instructions (Signed)
 Your CT scan is scheduled for tomorrow morning at 11am. Please arrive no later than 10:45 am [address] [place], [place]. Nothing by mouth after midnight.

## 2024-06-27 ENCOUNTER — Encounter: Payer: Self-pay | Admitting: Nurse Practitioner

## 2024-06-27 ENCOUNTER — Ambulatory Visit: Payer: Self-pay | Admitting: Nurse Practitioner

## 2024-06-27 ENCOUNTER — Ambulatory Visit
Admission: RE | Admit: 2024-06-27 | Discharge: 2024-06-27 | Disposition: A | Discharge status: Home / Self Care | Source: Ambulatory Visit | Attending: Nurse Practitioner | Admitting: Nurse Practitioner

## 2024-06-27 DIAGNOSIS — R1032 Left lower quadrant pain: Secondary | ICD-10-CM | POA: Insufficient documentation

## 2024-06-27 DIAGNOSIS — K429 Umbilical hernia without obstruction or gangrene: Secondary | ICD-10-CM | POA: Diagnosis not present

## 2024-06-27 DIAGNOSIS — R197 Diarrhea, unspecified: Secondary | ICD-10-CM | POA: Diagnosis not present

## 2024-06-27 DIAGNOSIS — K760 Fatty (change of) liver, not elsewhere classified: Secondary | ICD-10-CM | POA: Diagnosis not present

## 2024-06-27 DIAGNOSIS — K5730 Diverticulosis of large intestine without perforation or abscess without bleeding: Secondary | ICD-10-CM | POA: Diagnosis not present

## 2024-06-27 DIAGNOSIS — D259 Leiomyoma of uterus, unspecified: Secondary | ICD-10-CM | POA: Diagnosis not present

## 2024-06-27 LAB — CBC WITH DIFFERENTIAL/PLATELET
Basophils Absolute: 0 x10E3/uL (ref 0.0–0.2)
Basos: 0 %
EOS (ABSOLUTE): 0.1 x10E3/uL (ref 0.0–0.4)
Eos: 2 %
Hematocrit: 42.1 % (ref 34.0–46.6)
Hemoglobin: 13.9 g/dL (ref 11.1–15.9)
Immature Grans (Abs): 0 x10E3/uL (ref 0.0–0.1)
Immature Granulocytes: 0 %
Lymphocytes Absolute: 1.2 x10E3/uL (ref 0.7–3.1)
Lymphs: 27 %
MCH: 32.9 pg (ref 26.6–33.0)
MCHC: 33 g/dL (ref 31.5–35.7)
MCV: 100 fL — ABNORMAL HIGH (ref 79–97)
Monocytes Absolute: 0.3 x10E3/uL (ref 0.1–0.9)
Monocytes: 7 %
Neutrophils Absolute: 2.9 x10E3/uL (ref 1.4–7.0)
Neutrophils: 64 %
Platelets: 239 x10E3/uL (ref 150–450)
RBC: 4.23 x10E6/uL (ref 3.77–5.28)
RDW: 13.3 % (ref 11.7–15.4)
WBC: 4.5 x10E3/uL (ref 3.4–10.8)

## 2024-06-27 LAB — COMPREHENSIVE METABOLIC PANEL WITH GFR
ALT: 14 IU/L (ref 0–32)
AST: 22 IU/L (ref 0–40)
Albumin: 4 g/dL (ref 3.9–4.9)
Alkaline Phosphatase: 102 IU/L (ref 44–121)
BUN/Creatinine Ratio: 14 (ref 9–23)
BUN: 13 mg/dL (ref 6–24)
Bilirubin Total: 0.3 mg/dL (ref 0.0–1.2)
CO2: 21 mmol/L (ref 20–29)
Calcium: 9.4 mg/dL (ref 8.7–10.2)
Chloride: 105 mmol/L (ref 96–106)
Creatinine, Ser: 0.91 mg/dL (ref 0.57–1.00)
Globulin, Total: 2.6 g/dL (ref 1.5–4.5)
Glucose: 100 mg/dL — ABNORMAL HIGH (ref 70–99)
Potassium: 3.5 mmol/L (ref 3.5–5.2)
Sodium: 142 mmol/L (ref 134–144)
Total Protein: 6.6 g/dL (ref 6.0–8.5)
eGFR: 77 mL/min/1.73 (ref >59)

## 2024-06-27 LAB — LIPASE: Lipase: 57 U/L (ref 14–72)

## 2024-06-27 MED ORDER — IOHEXOL 9 MG/ML PO SOLN
500.0000 mL | ORAL | Status: AC
  Administered 2024-06-27 (×2): 500 mL via ORAL

## 2024-06-27 MED ORDER — IOHEXOL 300 MG/ML  SOLN
100.0000 mL | Freq: Once | INTRAMUSCULAR | Status: AC | PRN
  Administered 2024-06-27: 100 mL via INTRAVENOUS

## 2024-06-28 MED ORDER — DICYCLOMINE HCL 10 MG PO CAPS: 10.0000 mg | ORAL_CAPSULE | Freq: Three times a day (TID) | ORAL | 0 refills | Status: DC

## 2024-07-18 ENCOUNTER — Other Ambulatory Visit: Payer: Self-pay

## 2024-07-18 DIAGNOSIS — E1165 Type 2 diabetes mellitus with hyperglycemia: Secondary | ICD-10-CM

## 2024-07-18 NOTE — Progress Notes (Signed)
   07/18/2024  Patient ID: Melinda Ross, female   DOB: [birth year], 53 y.o.   MRN: [id number]  Subjective/Objective Initial outreach to discuss management of diabetes  Diabetes Management -Current medications:  Metformin  1000mg  BID, Ozempic  1mg  weekly -Ozempic  increased to 1mg  weekly approximately 6 weeks ago.  She was previously on the 2mg  dose but went without with medication for quite sometime and titration needed to be restarted. -A1c 7/11 down to 5.9% from 8.2% last year -Patient just got new glucometer and testing supplies recently but did not provide BG readings -States she had lost 70lbs when previously at 2mg  dose of Ozempic  but did gain some weight back and cholesterol increased when this occurred -Patient does not like taking pills and is eager to increase Ozempic  to 2mg  weekly in hopes of stopping metformin  -Patient was seen at Urgent Care and by PCP 3 weeks ago for stomach pain and diarrhea.  Did not have N/V, and was prescribed dicyclomine  for stomach cramps.  She states GI symptoms have resolved. -Taking losartan  25mg  daily, which is providing renal protection  Hyperlipidemia -Current medications:  atorvastatin  20mg  daily -LDL elevated at 165 7/11, so statin therapy resumed  Assessment/Plan  Diabetes Management -Currently controlled -I recommend another 4 weeks of Ozempic  1mg  just to verify no GI sx present once dicyclomine  is out of system, then we could consider increasing to 2mg  weekly -Could also consider decreasing metformin  to 1000mg  daily based on recent A1c of 5.9%.  Could potentially stop medication once Ozempic  at 2mg  dose to decrease pill burden (patient preference). -Monitor FBG at least 3x/week -Sees PCP again in October and will be due for A1c  Hyperlipidemia -Continue  current regimen at this time -Recommend follow-up lipid panel and CMP at next PCP visit; if LDL >70, consider increasing atorvastatin  to 40mg  daily  Follow-up:  4 weeks  Melinda Ross, PharmD, DPLA

## 2024-07-24 ENCOUNTER — Other Ambulatory Visit (HOSPITAL_COMMUNITY): Payer: Self-pay

## 2024-07-24 ENCOUNTER — Telehealth: Payer: Self-pay

## 2024-07-27 ENCOUNTER — Telehealth: Payer: Self-pay

## 2024-07-27 NOTE — Progress Notes (Signed)
   07/27/2024  Patient ID: Melinda Ross, female   DOB: 11-12-1971, 53 y.o.   MRN: [id number]  In basket message from patient stating she is due for Ozempic  dose today, but has not been able to get medication refilled at [facility].  Contacted the pharmacy, and Ozempic  is requiring a prior authorization.  A request has been submitted via [employer] portal.  I will monitor progress of the PA and follow-up with pharmacy once approved.  I am sending the patient a MyChart message to make her aware.  Telephone visit already scheduled with patient for 9/24.  Melinda Ross, PharmD, DPLA

## 2024-07-31 ENCOUNTER — Telehealth: Payer: Self-pay

## 2024-07-31 DIAGNOSIS — Z23 Encounter for immunization: Secondary | ICD-10-CM | POA: Diagnosis not present

## 2024-07-31 NOTE — Progress Notes (Signed)
   07/31/2024  Patient ID: Melinda Ross Nam, female   DOB: 03-03-1971, 53 y.o.   MRN: [id number]  Prior authorization for Ozempic  1mg  weekly has been approved, and test claim is reflecting $4 copay.  Contacted [facility], and medication will be ready later today.  Contacted patient to make her aware, but mailbox was full- sending MyChart message.  [name], PharmD, DPLA

## 2024-07-31 NOTE — Telephone Encounter (Signed)
 ERROR

## 2024-08-14 NOTE — Progress Notes (Unsigned)
   08/15/2024  Patient ID: Melinda Ross, female   DOB: May 26, 1971, 53 y.o.   MRN: [id number]  Subjective/Objective Telephone visit to follow-up on  management of diabetes  Diabetes Management -Current medications:  Metformin  1000mg  BID, Ozempic  1mg  weekly -Ozempic  increased to 1mg  weekly approximately 10 weeks ago.  She was previously on the 2mg  dose but went without with medication for quite sometime and titration needed to be restarted. -A1c 7/11 down to 5.9% from 8.2% last year -Patient just got new glucometer and testing supplies recently but did not provide BG readings -States she had lost 70lbs when previously at 2mg  dose of Ozempic  but did gain some weight back and cholesterol increased when this occurred -Patient does not like taking pills and is eager to increase Ozempic  to 2mg  weekly in hopes of stopping metformin  -Patient was seen at Urgent Care and by PCP 3 weeks ago for stomach pain and diarrhea.  Did not have N/V, and was prescribed dicyclomine  for stomach cramps.  She states GI symptoms have resolved. -Taking losartan  25mg  daily, which is providing renal protection  Hyperlipidemia -Current medications:  atorvastatin  20mg  daily -LDL elevated at 165 7/11, so statin therapy resumed  Assessment/Plan  Diabetes Management -A1c and  BP at goal -Continue current regimen at this time; patient has just enough Ozempic  1mg  to last until next PCP visit -Patient has upcoming colonoscopy next week but has not been contacted in regard to prep for this procedure; I have provided her with the contact number for [facility] and make sure to advise she inform them she is on Ozempic  (will likely need to skip dose next week) -Sees PCP again in October and will be due for A1c; I recommend increasing Ozempic  to 2mg  at this time- consider decreasing or stopping metformin  based on control (patient would like to be able to come off of any unnecessary oral medications)  Hyperlipidemia -Continue   current regimen at this time -Recommend follow-up lipid panel and CMP at next PCP visit; if LDL >70, consider increasing atorvastatin  to 40mg  daily  Follow-up:  6 weeks  Melinda Ross, PharmD, DPLA

## 2024-08-15 ENCOUNTER — Other Ambulatory Visit: Payer: Self-pay

## 2024-08-15 DIAGNOSIS — E119 Type 2 diabetes mellitus without complications: Secondary | ICD-10-CM

## 2024-08-15 DIAGNOSIS — E785 Hyperlipidemia, unspecified: Secondary | ICD-10-CM

## 2024-08-15 DIAGNOSIS — E1165 Type 2 diabetes mellitus with hyperglycemia: Secondary | ICD-10-CM

## 2024-08-24 DIAGNOSIS — Z83719 Family history of colon polyps, unspecified: Secondary | ICD-10-CM | POA: Diagnosis not present

## 2024-08-24 DIAGNOSIS — Z1211 Encounter for screening for malignant neoplasm of colon: Secondary | ICD-10-CM | POA: Diagnosis not present

## 2024-08-30 ENCOUNTER — Encounter: Payer: Self-pay | Admitting: Gastroenterology

## 2024-08-31 ENCOUNTER — Encounter: Payer: Self-pay | Admitting: Gastroenterology

## 2024-08-31 NOTE — Anesthesia Preprocedure Evaluation (Addendum)
 Anesthesia Evaluation  Patient identified by MRN, date of birth, ID band Patient awake    Reviewed: Allergy & Precautions, NPO status , Patient's Chart, lab work & pertinent test results  Airway Mallampati: II  TM Distance: >3 FB Neck ROM: full    Dental  (+) Teeth Intact   Pulmonary COPD, Current Smoker and Patient abstained from smoking. Hx of PE.   Pulmonary exam normal  + decreased breath sounds      Cardiovascular Exercise Tolerance: Good negative cardio ROS Normal cardiovascular exam Rhythm:Regular Rate:Normal  09-25-21 ER note EKG   I, Guadalupe Eagles, attending physician, personally viewed and interpreted this EKG   EKG Time: 1312 Rate: 81 Rhythm: normal sinus rhythm Axis: normal Intervals: qtc 453 QRS: low voltage ST changes: no st elevation Impression: abnormal ekg    Neuro/Psych    Depression    negative neurological ROS  negative psych ROS   GI/Hepatic negative GI ROS, Neg liver ROS,GERD  Medicated,,Hx spasmodic epigastric pain, GERD   Endo/Other  diabetes, Well Controlled, Type 1, Insulin  Dependent  Class 3 obesity  Renal/GU negative Renal ROS  negative genitourinary   Musculoskeletal   Abdominal  (+) + obese  Peds negative pediatric ROS (+)  Hematology negative hematology ROS (+)   Anesthesia Other Findings Past Medical History: No date: ADHD (attention deficit hyperactivity disorder)     Comment:  only when younger No date: Anemia No date: Arthritis No date: Collagen vascular disease     Comment:  lupus No date: Diabetes mellitus without complication (HCC) 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 No date: DVT of leg (deep venous thrombosis) (HCC) 2015: Genital herpes No date: GERD (gastroesophageal reflux disease) No date: Hyperlipidemia No date: Lupus     Comment:  SEES DR RONA GLENN  Discoid No date: Mild depression No date: Morbid obesity (HCC) No date: Numbness 09/03/2020:  Personal history of venous thrombosis and embolism No date: Pulmonary emboli (HCC) No date: Systemic lupus erythematosus, unspecified SLE type,  unspecified organ involvement status (HCC) No date: Tobacco abuse No date: Tobacco dependence No date: UTI (urinary tract infection)  Past Surgical History: No date: ABLATION     Comment:  2017 Dr. Leonce [facility]  2003: CESAREAN SECTION     Comment:  [facility] No date: DG TOES*L*; Bilateral     Comment:  2 bones removed in small toes 2002  12/24/2016: DILATATION & CURETTAGE/HYSTEROSCOPY WITH MYOSURE; N/A     Comment:  Procedure: DILATATION & CURETTAGE/HYSTEROSCOPY WITH               MYOSURE;  Surgeon: Glory High, MD;  Location: [facility]               ORS;  Service: Gynecology;  Laterality: N/A; No date: DILATION AND CURETTAGE OF UTERUS 03/25/2017: HYSTEROSCOPY WITH NOVASURE; N/A     Comment:  Procedure: HYSTEROSCOPY WITH NOVASURE;  Surgeon:               High Glory, MD;  Location: [facility] ORS;  Service:               Gynecology;  Laterality: N/A; 2003: TUBAL LIGATION     Comment:  [facility]  BMI    Body Mass Index: 37.91 kg/m      Reproductive/Obstetrics negative OB ROS                              Anesthesia Physical Anesthesia Plan  ASA: 3  Anesthesia Plan: General   Post-op Pain Management:    Induction: Intravenous  PONV Risk Score and Plan: Propofol  infusion and TIVA  Airway Management Planned: Natural Airway and Nasal Cannula  Additional Equipment:   Intra-op Plan:   Post-operative Plan:   Informed Consent: I have reviewed the patients History and Physical, chart, labs and discussed the procedure including the risks, benefits and alternatives for the proposed anesthesia with the patient or authorized representative who has indicated his/her understanding and acceptance.     Dental Advisory Given  Plan Discussed with: Anesthesiologist, CRNA and  Surgeon  Anesthesia Plan Comments: (Patient consented for risks of anesthesia including but not limited to:  - adverse reactions to medications - risk of airway placement if required - damage to eyes, teeth, lips or other oral mucosa - nerve damage due to positioning  - sore throat or hoarseness - Damage to heart, brain, nerves, lungs, other parts of body or loss of life  Patient voiced understanding and assent.)         Anesthesia Quick Evaluation

## 2024-09-03 ENCOUNTER — Ambulatory Visit: Admitting: Nurse Practitioner

## 2024-09-03 ENCOUNTER — Encounter: Payer: Self-pay | Admitting: Nurse Practitioner

## 2024-09-03 VITALS — BP 113/76 | HR 87 | Temp 98.0°F | Ht 63.0 in | Wt 219.6 lb

## 2024-09-03 DIAGNOSIS — E119 Type 2 diabetes mellitus without complications: Secondary | ICD-10-CM

## 2024-09-03 DIAGNOSIS — Z7985 Long-term (current) use of injectable non-insulin antidiabetic drugs: Secondary | ICD-10-CM

## 2024-09-03 DIAGNOSIS — E785 Hyperlipidemia, unspecified: Secondary | ICD-10-CM | POA: Diagnosis not present

## 2024-09-03 DIAGNOSIS — Z6841 Body Mass Index (BMI) 40.0 and over, adult: Secondary | ICD-10-CM

## 2024-09-03 DIAGNOSIS — M329 Systemic lupus erythematosus, unspecified: Secondary | ICD-10-CM | POA: Diagnosis not present

## 2024-09-03 MED ORDER — SEMAGLUTIDE (2 MG/DOSE) 8 MG/3ML ~~LOC~~ SOPN: 2.0000 mg | PEN_INJECTOR | SUBCUTANEOUS | 1 refills | Status: AC

## 2024-09-03 NOTE — Progress Notes (Signed)
 BP 113/76   Pulse 87   Temp 98 F (36.7 C) (Oral)   Ht 5' 3 (1.6 m)   Wt 219 lb 9.6 oz (99.6 kg)   LMP 09/23/2023 (Approximate)   SpO2 99%   BMI 38.90 kg/m    Subjective:    Patient ID: Melinda Ross, female    DOB: 25-Feb-1971, 53 y.o.   MRN: [id number]  HPI: Melinda Ross is a 53 y.o. female  Chief Complaint  Patient presents with   Diabetes    Eye exam requested from [facility]   Hyperlipidemia   HYPERTENSION without Chronic Kidney Disease Hypertension status: controlled  Satisfied with current treatment? yes Duration of hypertension: years BP monitoring frequency:  not checking BP range:  BP medication side effects:  no Medication compliance: excellent compliance Previous BP meds:none Aspirin: no Recurrent headaches: no Visual changes: no Palpitations: no Dyspnea: no Chest pain: no Lower extremity edema: no Dizzy/lightheaded: no  DIABETES Patient is on Ozempic  1mg  weekly . Tolerating them well.  Would like to increase her Ozempic  2mg .  Hypoglycemic episodes:no Polydipsia/polyuria: no Visual disturbance: no Chest pain: no Paresthesias: no Glucose Monitoring: no  Accucheck frequency: Not Checking  Fasting glucose:  Post prandial:  Evening:  Before meals: Taking Insulin ?: no  Long acting insulin :  Short acting insulin : Blood Pressure Monitoring: not checking Retinal Examination: Has an appt in August- [facility] Exam: Up to Date Diabetic Education: Not Completed Pneumovax: Up to Date Influenza: Up to Date Aspirin: no   Relevant past medical, surgical, family and social history reviewed and updated as indicated. Interim medical history since our last visit reviewed. Allergies and medications reviewed and updated.  Review of Systems  Eyes:  Negative for visual disturbance.  Respiratory:  Negative for cough, chest tightness and shortness of breath.   Cardiovascular:  Negative for chest pain, palpitations and leg swelling.   Endocrine: Negative for polydipsia and polyuria.  Neurological:  Negative for dizziness, numbness and headaches.    Per HPI unless specifically indicated above     Objective:    BP 113/76   Pulse 87   Temp 98 F (36.7 C) (Oral)   Ht 5' 3 (1.6 m)   Wt 219 lb 9.6 oz (99.6 kg)   LMP 09/23/2023 (Approximate)   SpO2 99%   BMI 38.90 kg/m   Wt Readings from Last 3 Encounters:  09/03/24 219 lb 9.6 oz (99.6 kg)  06/26/24 218 lb 3.2 oz (99 kg)  06/01/24 219 lb 3.2 oz (99.4 kg)    Physical Exam Vitals and nursing note reviewed.  Constitutional:      General: She is not in acute distress.    Appearance: Normal appearance. She is obese. She is not ill-appearing, toxic-appearing or diaphoretic.  HENT:     Head: Normocephalic.     Right Ear: External ear normal.     Left Ear: External ear normal.     Nose: Nose normal.     Mouth/Throat:     Mouth: Mucous membranes are moist.     Pharynx: Oropharynx is clear.  Eyes:     General:        Right eye: No discharge.        Left eye: No discharge.     Extraocular Movements: Extraocular movements intact.     Conjunctiva/sclera: Conjunctivae normal.     Pupils: Pupils are equal, round, and reactive to light.  Cardiovascular:     Rate and Rhythm: Normal rate and regular  rhythm.     Heart sounds: No murmur heard. Pulmonary:     Effort: Pulmonary effort is normal. No respiratory distress.     Breath sounds: Normal breath sounds. No wheezing or rales.  Musculoskeletal:     Cervical back: Normal range of motion and neck supple.  Skin:    General: Skin is warm and dry.     Capillary Refill: Capillary refill takes less than 2 seconds.  Neurological:     General: No focal deficit present.     Mental Status: She is alert and oriented to person, place, and time. Mental status is at baseline.  Psychiatric:        Mood and Affect: Mood normal.        Behavior: Behavior normal.        Thought Content: Thought content normal.         Judgment: Judgment normal.     Results for orders placed or performed in visit on 06/26/24  CBC w/Diff   Collection Time: 06/26/24  3:25 PM  Result Value Ref Range   WBC 4.5 3.4 - 10.8 x10E3/uL   RBC 4.23 3.77 - 5.28 x10E6/uL   Hemoglobin 13.9 11.1 - 15.9 g/dL   Hematocrit 57.8 65.9 - 46.6 %   MCV 100 (H) 79 - 97 fL   MCH 32.9 26.6 - 33.0 pg   MCHC 33.0 31.5 - 35.7 g/dL   RDW 86.6 88.2 - 84.5 %   Platelets 239 150 - 450 x10E3/uL   Neutrophils 64 Not Estab. %   Lymphs 27 Not Estab. %   Monocytes 7 Not Estab. %   Eos 2 Not Estab. %   Basos 0 Not Estab. %   Neutrophils Absolute 2.9 1.4 - 7.0 x10E3/uL   Lymphocytes Absolute 1.2 0.7 - 3.1 x10E3/uL   Monocytes Absolute 0.3 0.1 - 0.9 x10E3/uL   EOS (ABSOLUTE) 0.1 0.0 - 0.4 x10E3/uL   Basophils Absolute 0.0 0.0 - 0.2 x10E3/uL   Immature Granulocytes 0 Not Estab. %   Immature Grans (Abs) 0.0 0.0 - 0.1 x10E3/uL  Comp Met (CMET)   Collection Time: 06/26/24  3:25 PM  Result Value Ref Range   Glucose 100 (H) 70 - 99 mg/dL   BUN 13 6 - 24 mg/dL   Creatinine, Ser 9.08 0.57 - 1.00 mg/dL   eGFR 77 >40 fO/fpw/8.26   BUN/Creatinine Ratio 14 9 - 23   Sodium 142 134 - 144 mmol/L   Potassium 3.5 3.5 - 5.2 mmol/L   Chloride 105 96 - 106 mmol/L   CO2 21 20 - 29 mmol/L   Calcium  9.4 8.7 - 10.2 mg/dL   Total Protein 6.6 6.0 - 8.5 g/dL   Albumin 4.0 3.9 - 4.9 g/dL   Globulin, Total 2.6 1.5 - 4.5 g/dL   Bilirubin Total 0.3 0.0 - 1.2 mg/dL   Alkaline Phosphatase 102 44 - 121 IU/L   AST 22 0 - 40 IU/L   ALT 14 0 - 32 IU/L  Lipase   Collection Time: 06/26/24  3:25 PM  Result Value Ref Range   Lipase 57 14 - 72 U/L      Assessment & Plan:   Problem List Items Addressed This Visit       Endocrine   Diabetes mellitus treated with injections of non-insulin  medication (HCC) - Primary   Chronic.  Controlled.  Last A1c in July was 5.8%.  Will increase Ozempic  to 2mg  weekly to help with weight loss.  Eye exam up to  date.  Declined pneumonia.   Continue with Atorvastatin .  Continue with current medication regimen.  Labs ordered today.  Return to clinic in 6 months for reevaluation.  Call sooner if concerns arise.        Relevant Medications   Semaglutide , 2 MG/DOSE, 8 MG/3ML SOPN   Other Relevant Orders   Comprehensive metabolic panel with GFR   Hemoglobin A1c     Other   SLE (systemic lupus erythematosus) (HCC)   Patient has an upcoming appt with Rheumatology to restart Plaquenil .  Continue to follow up with specialist.      Hyperlipidemia   Chronic.  Controlled.  Continue with current medication regimen of atorvastatin  20mg .  Labs ordered today.  Return to clinic in 6 months for reevaluation.  Call sooner if concerns arise.        Relevant Orders   Lipid panel   Morbid obesity with BMI of 40.0-44.9, adult (HCC)   Recommended eating smaller high protein, low fat meals more frequently and exercising 30 mins a day 5 times a week with a goal of 10-15lb weight loss in the next 3 months.  Ozempic  increased to 2mg .      Relevant Medications   Semaglutide , 2 MG/DOSE, 8 MG/3ML SOPN     Follow up plan: Return in about 6 months (around 03/04/2025) for HTN, HLD, DM2 FU.

## 2024-09-03 NOTE — Assessment & Plan Note (Signed)
 Recommended eating smaller high protein, low fat meals more frequently and exercising 30 mins a day 5 times a week with a goal of 10-15lb weight loss in the next 3 months.  Ozempic  increased to 2mg .

## 2024-09-03 NOTE — Assessment & Plan Note (Signed)
 Chronic.  Controlled.  Last A1c in July was 5.8%.  Will increase Ozempic  to 2mg  weekly to help with weight loss.  Eye exam up to date.  Declined pneumonia.  Continue with Atorvastatin .  Continue with current medication regimen.  Labs ordered today.  Return to clinic in 6 months for reevaluation.  Call sooner if concerns arise.

## 2024-09-03 NOTE — Assessment & Plan Note (Signed)
 Patient has an upcoming appt with Rheumatology to restart Plaquenil .  Continue to follow up with specialist.

## 2024-09-03 NOTE — Assessment & Plan Note (Signed)
 Chronic.  Controlled.  Continue with current medication regimen of atorvastatin 20mg .  Labs ordered today.  Return to clinic in 6 months for reevaluation.  Call sooner if concerns arise.

## 2024-09-04 ENCOUNTER — Ambulatory Visit: Payer: Self-pay | Admitting: Nurse Practitioner

## 2024-09-04 LAB — COMPREHENSIVE METABOLIC PANEL WITH GFR
ALT: 11 IU/L (ref 0–32)
AST: 17 IU/L (ref 0–40)
Albumin: 4.1 g/dL (ref 3.8–4.9)
Alkaline Phosphatase: 91 IU/L (ref 49–135)
BUN/Creatinine Ratio: 14 (ref 9–23)
BUN: 11 mg/dL (ref 6–24)
Bilirubin Total: 0.3 mg/dL (ref 0.0–1.2)
CO2: 22 mmol/L (ref 20–29)
Calcium: 9 mg/dL (ref 8.7–10.2)
Chloride: 106 mmol/L (ref 96–106)
Creatinine, Ser: 0.8 mg/dL (ref 0.57–1.00)
Globulin, Total: 2.6 g/dL (ref 1.5–4.5)
Glucose: 75 mg/dL (ref 70–99)
Potassium: 3.8 mmol/L (ref 3.5–5.2)
Sodium: 140 mmol/L (ref 134–144)
Total Protein: 6.7 g/dL (ref 6.0–8.5)
eGFR: 89 mL/min/1.73 (ref >59)

## 2024-09-04 LAB — LIPID PANEL
Chol/HDL Ratio: 6.8 ratio — ABNORMAL HIGH (ref 0.0–4.4)
Cholesterol, Total: 205 mg/dL — ABNORMAL HIGH (ref 100–199)
HDL: 30 mg/dL — ABNORMAL LOW (ref >39)
LDL Chol Calc (NIH): 150 mg/dL — ABNORMAL HIGH (ref 0–99)
Triglycerides: 134 mg/dL (ref 0–149)
VLDL Cholesterol Cal: 25 mg/dL (ref 5–40)

## 2024-09-04 LAB — HEMOGLOBIN A1C
Est. average glucose Bld gHb Est-mCnc: 114 mg/dL
Hgb A1c MFr Bld: 5.6 % (ref 4.8–5.6)

## 2024-09-06 ENCOUNTER — Ambulatory Visit: Payer: Self-pay | Admitting: Anesthesiology

## 2024-09-06 ENCOUNTER — Encounter
Admission: RE | Disposition: A | Discharge status: Home / Self Care | Payer: Self-pay | Source: Home / Self Care | Attending: Gastroenterology

## 2024-09-06 ENCOUNTER — Ambulatory Visit
Admission: RE | Admit: 2024-09-06 | Discharge: 2024-09-06 | Disposition: A | Discharge status: Home / Self Care | Attending: Gastroenterology | Admitting: Gastroenterology

## 2024-09-06 ENCOUNTER — Encounter: Payer: Self-pay | Admitting: Gastroenterology

## 2024-09-06 DIAGNOSIS — Z79899 Other long term (current) drug therapy: Secondary | ICD-10-CM | POA: Diagnosis not present

## 2024-09-06 DIAGNOSIS — Z6837 Body mass index (BMI) 37.0-37.9, adult: Secondary | ICD-10-CM | POA: Insufficient documentation

## 2024-09-06 DIAGNOSIS — E66813 Obesity, class 3: Secondary | ICD-10-CM | POA: Insufficient documentation

## 2024-09-06 DIAGNOSIS — K635 Polyp of colon: Secondary | ICD-10-CM | POA: Diagnosis not present

## 2024-09-06 DIAGNOSIS — E109 Type 1 diabetes mellitus without complications: Secondary | ICD-10-CM | POA: Insufficient documentation

## 2024-09-06 DIAGNOSIS — D122 Benign neoplasm of ascending colon: Secondary | ICD-10-CM | POA: Insufficient documentation

## 2024-09-06 DIAGNOSIS — J449 Chronic obstructive pulmonary disease, unspecified: Secondary | ICD-10-CM | POA: Diagnosis not present

## 2024-09-06 DIAGNOSIS — K5730 Diverticulosis of large intestine without perforation or abscess without bleeding: Secondary | ICD-10-CM | POA: Insufficient documentation

## 2024-09-06 DIAGNOSIS — D124 Benign neoplasm of descending colon: Secondary | ICD-10-CM | POA: Diagnosis not present

## 2024-09-06 DIAGNOSIS — Z8371 Family history of adenomatous and serrated polyps: Secondary | ICD-10-CM | POA: Insufficient documentation

## 2024-09-06 DIAGNOSIS — Q8589 Other phakomatoses, not elsewhere classified: Secondary | ICD-10-CM | POA: Diagnosis not present

## 2024-09-06 DIAGNOSIS — F32A Depression, unspecified: Secondary | ICD-10-CM | POA: Diagnosis not present

## 2024-09-06 DIAGNOSIS — Z83719 Family history of colon polyps, unspecified: Secondary | ICD-10-CM | POA: Diagnosis not present

## 2024-09-06 DIAGNOSIS — Z794 Long term (current) use of insulin: Secondary | ICD-10-CM | POA: Insufficient documentation

## 2024-09-06 DIAGNOSIS — Z86711 Personal history of pulmonary embolism: Secondary | ICD-10-CM | POA: Diagnosis not present

## 2024-09-06 DIAGNOSIS — Z7985 Long-term (current) use of injectable non-insulin antidiabetic drugs: Secondary | ICD-10-CM | POA: Diagnosis not present

## 2024-09-06 DIAGNOSIS — K219 Gastro-esophageal reflux disease without esophagitis: Secondary | ICD-10-CM | POA: Insufficient documentation

## 2024-09-06 DIAGNOSIS — Z800 Family history of malignant neoplasm of digestive organs: Secondary | ICD-10-CM | POA: Diagnosis not present

## 2024-09-06 DIAGNOSIS — E119 Type 2 diabetes mellitus without complications: Secondary | ICD-10-CM | POA: Diagnosis not present

## 2024-09-06 DIAGNOSIS — F17210 Nicotine dependence, cigarettes, uncomplicated: Secondary | ICD-10-CM | POA: Insufficient documentation

## 2024-09-06 DIAGNOSIS — Z1211 Encounter for screening for malignant neoplasm of colon: Secondary | ICD-10-CM | POA: Diagnosis not present

## 2024-09-06 HISTORY — DX: Attention-deficit hyperactivity disorder, unspecified type: F90.9

## 2024-09-06 HISTORY — DX: Tobacco use: Z72.0

## 2024-09-06 HISTORY — PX: COLONOSCOPY: SHX5424

## 2024-09-06 HISTORY — DX: Discoid lupus erythematosus: L93.0

## 2024-09-06 HISTORY — DX: Systemic lupus erythematosus, unspecified: M32.9

## 2024-09-06 HISTORY — PX: POLYPECTOMY: SHX149

## 2024-09-06 HISTORY — DX: Nicotine dependence, unspecified, uncomplicated: F17.200

## 2024-09-06 HISTORY — DX: Morbid (severe) obesity due to excess calories: E66.01

## 2024-09-06 HISTORY — DX: Gastro-esophageal reflux disease without esophagitis: K21.9

## 2024-09-06 LAB — GLUCOSE, CAPILLARY: Glucose-Capillary: 82 mg/dL (ref 70–99)

## 2024-09-06 SURGERY — COLONOSCOPY
Anesthesia: General

## 2024-09-06 MED ORDER — SODIUM CHLORIDE 0.9 % IV SOLN
INTRAVENOUS | Status: DC
  Administered 2024-09-06: 1000 mL via INTRAVENOUS

## 2024-09-06 MED ORDER — PROPOFOL 10 MG/ML IV BOLUS
INTRAVENOUS | Status: DC | PRN
  Administered 2024-09-06: 70 mg via INTRAVENOUS

## 2024-09-06 MED ORDER — PROPOFOL 500 MG/50ML IV EMUL
INTRAVENOUS | Status: DC | PRN
  Administered 2024-09-06: 140 ug/kg/min via INTRAVENOUS

## 2024-09-06 NOTE — Op Note (Signed)
 [facility] Gastroenterology Patient Name: Melinda Ross Procedure Date: 09/06/2024 9:01 AM MRN: [id number] Account #: [id number] Date of Birth: 05-20-1971 Admit Type: Outpatient Age: 53 Room: [facility] ENDO ROOM 3 Gender: Female Note Status: Finalized Instrument Name: Colon Scope [id number] Procedure:             Colonoscopy Indications:           Screening for colorectal malignant neoplasm, Colon                         cancer screening in patient at increased risk: Family                         history of 1st-degree relative with colon polyps Providers:             Ruel Kung MD, MD Medicines:             Monitored Anesthesia Care Complications:         No immediate complications. Procedure:             Pre-Anesthesia Assessment:                        - Prior to the procedure, a History and Physical was                         performed, and patient medications, allergies and                         sensitivities were reviewed. The patient's tolerance                         of previous anesthesia was reviewed.                        - The risks and benefits of the procedure and the                         sedation options and risks were discussed with the                         patient. All questions were answered and informed                         consent was obtained.                        - ASA Grade Assessment: II - A patient with mild                         systemic disease.                        After obtaining informed consent, the colonoscope was                         passed under direct vision. Throughout the procedure,                         the patient's blood pressure, pulse, and oxygen  saturations were monitored continuously. The                         Colonoscope was introduced through the anus and                         advanced to the the cecum, identified by the                         appendiceal orifice. The  colonoscopy was performed                         with ease. The patient tolerated the procedure well.                         The quality of the bowel preparation was good. The                         ileocecal valve, appendiceal orifice, and rectum were                         photographed. Findings:      The perianal and digital rectal examinations were normal.      Four sessile polyps were found in the sigmoid colon and descending       colon. The polyps were 4 to 5 mm in size. These polyps were removed with       a cold snare. Resection and retrieval were complete.      Five sessile polyps were found in the descending colon and ascending       colon. The polyps were 4 to 5 mm in size. These polyps were removed with       a cold snare. Resection and retrieval were complete.      Multiple medium-mouthed diverticula were found in the left colon.      The exam was otherwise without abnormality on direct and retroflexion       views. Impression:            - Four 4 to 5 mm polyps in the sigmoid colon and in                         the descending colon, removed with a cold snare.                         Resected and retrieved.                        - Five 4 to 5 mm polyps in the descending colon and in                         the ascending colon, removed with a cold snare.                         Resected and retrieved.                        - Diverticulosis in the left colon.                        -  The examination was otherwise normal on direct and                         retroflexion views. Recommendation:        - Discharge patient to home (with escort).                        - Resume previous diet.                        - Continue present medications.                        - Await pathology results.                        - Repeat colonoscopy for surveillance based on                         pathology results. Procedure Code(s):     --- Professional ---                         [phone], Colonoscopy, flexible; with removal of                         tumor(s), polyp(s), or other lesion(s) by snare                         technique Diagnosis Code(s):     --- Professional ---                        Z12.11, Encounter for screening for malignant neoplasm                         of colon                        Z83.71, Family history of colonic polyps                        D12.5, Benign neoplasm of sigmoid colon                        D12.4, Benign neoplasm of descending colon                        D12.2, Benign neoplasm of ascending colon                        K57.30, Diverticulosis of large intestine without                         perforation or abscess without bleeding CPT copyright 2022 American Medical Association. All rights reserved. The codes documented in this report are preliminary and upon coder review may  be revised to meet current compliance requirements. Ruel Kung, MD Ruel Kung MD, MD 09/06/2024 9:36:52 AM This report has been signed electronically. Number of Addenda: 0 Note Initiated On: 09/06/2024 9:01 AM Scope Withdrawal Time: 0 hours 10 minutes 46 seconds  Total Procedure Duration: 0 hours 14 minutes 39 seconds  Estimated Blood Loss:  Estimated blood loss: none.      [facility]

## 2024-09-06 NOTE — H&P (Signed)
 Ruel Kung , MD [address], [place], [place], [address] Phone: [phone] Fax: [phone]  Primary Care Physician:  Melvin Pao, NP   Pre-Procedure History & Physical: HPI:  Melinda Ross is a 53 y.o. female is here for an colonoscopy.   Past Medical History:  Diagnosis Date   ADHD (attention deficit hyperactivity disorder)    only when younger   Anemia    Arthritis    Collagen vascular disease    lupus   Diabetes mellitus without complication (HCC)    Discoid lupus erythematosus    DVT of leg (deep venous thrombosis) (HCC)    Genital herpes 2015   GERD (gastroesophageal reflux disease)    Hyperlipidemia    Lupus    SEES DR RONA GLENN  Discoid   Mild depression    Morbid obesity (HCC)    Numbness    Personal history of venous thrombosis and embolism 09/03/2020   Pulmonary emboli (HCC)    Systemic lupus erythematosus, unspecified SLE type, unspecified organ involvement status (HCC)    Tobacco abuse    Tobacco dependence    UTI (urinary tract infection)     Past Surgical History:  Procedure Laterality Date   ABLATION     2017 Dr. Leonce Dancer    CESAREAN SECTION  2003   [facility]   DG TOES*L* Bilateral    2 bones removed in small toes 2002    DILATATION & CURETTAGE/HYSTEROSCOPY WITH MYOSURE N/A 12/24/2016   Procedure: DILATATION & CURETTAGE/HYSTEROSCOPY WITH MYOSURE;  Surgeon: Glory High, MD;  Location: [facility] ORS;  Service: Gynecology;  Laterality: N/A;   DILATION AND CURETTAGE OF UTERUS     HYSTEROSCOPY WITH NOVASURE N/A 03/25/2017   Procedure: HYSTEROSCOPY WITH NOVASURE;  Surgeon: High Glory, MD;  Location: [facility] ORS;  Service: Gynecology;  Laterality: N/A;   TUBAL LIGATION  2003   [facility]    Prior to Admission medications   Medication Sig Start Date End Date Taking? Authorizing  Provider  atorvastatin  (LIPITOR) 20 MG tablet Take 1 tablet (20 mg total) by mouth daily. 06/01/24   Melvin Pao, NP  Blood Glucose Monitoring Suppl (ONETOUCH VERIO FLEX SYSTEM) w/Device KIT USE TO CHECK BLOOD SUGAR IN THE MORNING, AT NOON, AND AT BEDTIME. 06/04/24   [provider]  Cholecalciferol  1.25 MG (50000 UT) capsule Take 1 capsule (50,000 Units total) by mouth once a week. d3 09/02/22   McLean-Scocuzza, Randine SAILOR, MD  Glucose Blood (BLOOD GLUCOSE TEST STRIPS) STRP 1 each by In Vitro route in the morning, at noon, and at bedtime. May substitute to any manufacturer covered by patient's insurance. 06/01/24 07/06/25  Melvin Pao, NP  Lancets (ONETOUCH DELICA PLUS LANCET30G) MISC USE TO CHECK BLOOD SUGAR IN THE MORNING, AT NOON, AND AT BEDTIME.    [provider]  Semaglutide , 2 MG/DOSE, 8 MG/3ML SOPN Inject 2 mg as directed once a week. 09/03/24   Melvin Pao, NP    Allergies as of 08/24/2024   (No Known Allergies)    Family History  Problem Relation Age of Onset   Lung cancer Mother    COPD Mother    Heart attack Mother    Cancer Mother    Hearing loss Father    Heart disease Father    Hypertension Father    Heart disease Sister    Hypertension Sister    Depression Daughter    Diabetes Maternal Aunt    Other Maternal Aunt    Kidney failure Maternal  Aunt    High blood pressure Brother    Diabetes Brother    Kidney disease Brother    Arthritis Brother    Birth defects Brother    Breast cancer Neg Hx     Social History   Socioeconomic History   Marital status: Single    Spouse name: Not on file   Number of children: 3   Years of education: Not on file   Highest education level: Some college, no degree  Occupational History    Comment: [employer]  Tobacco Use   Smoking status: Every Day    Current packs/day: 0.50    Average packs/day: 0.5 packs/day for 37.8 years (18.9 ttl pk-yrs)    Types: Cigarettes    Start date: 11/22/1986     Passive exposure: Never   Smokeless tobacco: Never  Vaping Use   Vaping status: Never Used  Substance and Sexual Activity   Alcohol use: Yes    Comment: social - once every 2-3 months    Drug use: No   Sexual activity: Not Currently    Birth control/protection: Post-menopausal, Surgical  Other Topics Concern   Not on file  Social History Narrative   Patient is single and works at [employer]. Patient has three children 1 dog.   Right handed.    Social Drivers of Corporate investment banker Strain: Low Risk  (06/01/2024)   Overall Financial Resource Strain (CARDIA)    Difficulty of Paying Living Expenses: Not very hard  Food Insecurity: Food Insecurity Present (06/01/2024)   Hunger Vital Sign    Worried About Running Out of Food in the Last Year: Sometimes true    Ran Out of Food in the Last Year: Patient declined  Transportation Needs: No Transportation Needs (06/01/2024)   PRAPARE - Administrator, Civil Service (Medical): No    Lack of Transportation (Non-Medical): No  Physical Activity: Insufficiently Active (06/01/2024)   Exercise Vital Sign    Days of Exercise per Week: 5 days    Minutes of Exercise per Session: 20 min  Stress: No Stress Concern Present (06/01/2024)   [employer] of Occupational Health - Occupational Stress Questionnaire    Feeling of Stress: Not at all  Social Connections: Unknown (06/01/2024)   Social Connection and Isolation Panel    Frequency of Communication with Friends and Family: Patient declined    Frequency of Social Gatherings with Friends and Family: Patient declined    Attends Religious Services: Never    Database administrator or Organizations: No    Attends Banker Meetings: Never    Marital Status: Never married  Intimate Partner Violence: Not At Risk (06/01/2024)   Humiliation, Afraid, Rape, and Kick questionnaire    Fear of Current or Ex-Partner: No    Emotionally Abused: No    Physically Abused: No     Sexually Abused: No    Review of Systems: See HPI, otherwise negative ROS  Physical Exam: Ht 5' 3 (1.6 m)   Wt 98.9 kg   LMP 09/23/2023 (Approximate)   BMI 38.62 kg/m  General:   Alert,  pleasant and cooperative in NAD Head:  Normocephalic and atraumatic. Neck:  Supple; no masses or thyromegaly. Lungs:  Clear throughout to auscultation, normal respiratory effort.    Heart:  +S1, +S2, Regular rate and rhythm, No edema. Abdomen:  Soft, nontender and nondistended. Normal bowel sounds, without guarding, and without rebound.   Neurologic:  Alert and  oriented x4;  grossly normal neurologically.  Impression/Plan: Hadassah DELENA Nam is here for an colonoscopy to be performed for Screening colonoscopy , family history of colon polyps. Risks, benefits, limitations, and alternatives regarding  colonoscopy have been reviewed with the patient.  Questions have been answered.  All parties agreeable.   Ruel Kung, MD  09/06/2024, 8:37 AM

## 2024-09-06 NOTE — Transfer of Care (Signed)
 Immediate Anesthesia Transfer of Care Note  Patient: Melinda Ross  Procedure(s) Performed: COLONOSCOPY POLYPECTOMY, INTESTINE  Patient Location: PACU  Anesthesia Type:General  Level of Consciousness: awake, alert , and oriented  Airway & Oxygen Therapy: Patient Spontanous Breathing  Post-op Assessment: Report given to RN and Post -op Vital signs reviewed and stable  Post vital signs: Reviewed and stable  Last Vitals:  Vitals Value Taken Time  BP 109/82 09/06/24 09:58  Temp    Pulse 78 09/06/24 10:05  Resp 13 09/06/24 10:05  SpO2 100 % 09/06/24 10:05  Vitals shown include unfiled device data.  Last Pain:  Vitals:   09/06/24 0938  TempSrc:   PainSc: 0-No pain         Complications: No notable events documented.

## 2024-09-06 NOTE — Anesthesia Postprocedure Evaluation (Signed)
 Anesthesia Post Note  Patient: Melinda Ross  Procedure(s) Performed: COLONOSCOPY POLYPECTOMY, INTESTINE  Patient location during evaluation: PACU Anesthesia Type: General Level of consciousness: awake and awake and alert Pain management: satisfactory to patient Vital Signs Assessment: post-procedure vital signs reviewed and stable Respiratory status: spontaneous breathing Cardiovascular status: stable Anesthetic complications: no   No notable events documented.   Last Vitals:  Vitals:   09/06/24 0938 09/06/24 1005  BP: (!) 90/56   Pulse: 100 78  Resp: 16 13  Temp:    SpO2: 98% 100%    Last Pain:  Vitals:   09/06/24 0938  TempSrc:   PainSc: 0-No pain                 VAN STAVEREN,Hermenia Fritcher

## 2024-09-07 LAB — SURGICAL PATHOLOGY

## 2024-10-03 NOTE — Progress Notes (Unsigned)
   10/04/2024  Patient ID: Melinda Ross, female   DOB: 12/11/1970, 53 y.o.   MRN: [id number]  Outreach attempt for scheduled telephone visit was not successful, and patient's voicemail was full.  Sending a MyChart message to attempt to reschedule visit and will try to call again in another week or two if I do not hear back.  Melinda Ross, PharmD, DPLA

## 2024-10-04 ENCOUNTER — Other Ambulatory Visit: Payer: Self-pay

## 2025-03-04 ENCOUNTER — Ambulatory Visit: Admitting: Nurse Practitioner
